# Patient Record
Sex: Female | Born: 1935
Health system: Southern US, Community
[De-identification: ages and names within clinical notes are randomized; demographics above are authoritative.]

## PROBLEM LIST (undated history)

## (undated) DIAGNOSIS — I10 Essential (primary) hypertension: Secondary | ICD-10-CM

## (undated) DIAGNOSIS — I2510 Atherosclerotic heart disease of native coronary artery without angina pectoris: Secondary | ICD-10-CM

## (undated) DIAGNOSIS — E785 Hyperlipidemia, unspecified: Secondary | ICD-10-CM

## (undated) DIAGNOSIS — N183 Chronic kidney disease, stage 3 unspecified: Secondary | ICD-10-CM

## (undated) DIAGNOSIS — C801 Malignant (primary) neoplasm, unspecified: Secondary | ICD-10-CM

## (undated) DIAGNOSIS — T7840XA Allergy, unspecified, initial encounter: Secondary | ICD-10-CM

## (undated) DIAGNOSIS — I447 Left bundle-branch block, unspecified: Secondary | ICD-10-CM

## (undated) DIAGNOSIS — I5022 Chronic systolic (congestive) heart failure: Secondary | ICD-10-CM

## (undated) DIAGNOSIS — I255 Ischemic cardiomyopathy: Secondary | ICD-10-CM

## (undated) DIAGNOSIS — C4490 Unspecified malignant neoplasm of skin, unspecified: Secondary | ICD-10-CM

## (undated) DIAGNOSIS — R519 Headache, unspecified: Secondary | ICD-10-CM

## (undated) DIAGNOSIS — M1990 Unspecified osteoarthritis, unspecified site: Secondary | ICD-10-CM

## (undated) HISTORY — DX: Headache, unspecified: R51.9

## (undated) HISTORY — DX: Unspecified osteoarthritis, unspecified site: M19.90

## (undated) HISTORY — DX: Unspecified malignant neoplasm of skin, unspecified: C44.90

## (undated) HISTORY — DX: Chronic systolic (congestive) heart failure: I50.22

## (undated) HISTORY — DX: Malignant (primary) neoplasm, unspecified: C80.1

## (undated) HISTORY — DX: Hyperlipidemia, unspecified: E78.5

## (undated) HISTORY — DX: Allergy, unspecified, initial encounter: T78.40XA

## (undated) HISTORY — DX: Essential (primary) hypertension: I10

---

## 2010-10-17 DIAGNOSIS — I10 Essential (primary) hypertension: Secondary | ICD-10-CM | POA: Insufficient documentation

## 2010-10-17 DIAGNOSIS — E785 Hyperlipidemia, unspecified: Secondary | ICD-10-CM | POA: Insufficient documentation

## 2011-02-06 DIAGNOSIS — M810 Age-related osteoporosis without current pathological fracture: Secondary | ICD-10-CM | POA: Insufficient documentation

## 2011-02-18 DIAGNOSIS — N289 Disorder of kidney and ureter, unspecified: Secondary | ICD-10-CM | POA: Insufficient documentation

## 2011-10-29 ENCOUNTER — Telehealth: Payer: Self-pay

## 2011-10-29 NOTE — Telephone Encounter (Signed)
PT FAXED A REQUEST FOR CORRECT DOSAGE OF MEDICATION ON THE 15TH  THE DOSAGE IS WRONG  [id number]

## 2011-10-30 ENCOUNTER — Telehealth: Payer: Self-pay | Admitting: Physician Assistant

## 2011-10-30 DIAGNOSIS — I10 Essential (primary) hypertension: Secondary | ICD-10-CM

## 2011-10-30 MED ORDER — HYDROCHLOROTHIAZIDE 25 MG PO TABS: 25.0000 mg | ORAL_TABLET | Freq: Every day | ORAL | Status: DC

## 2011-10-30 NOTE — Telephone Encounter (Signed)
Rx faxed to [facility] and daughter notified.

## 2011-10-30 NOTE — Telephone Encounter (Signed)
See printed Rx for HCTZ 25 mg, #90, RF x 3 to fax with form.  Per OV notes, patient should return for re-eval in March. csj

## 2011-10-30 NOTE — Telephone Encounter (Signed)
Medication corrected and faxed in to [facility], see other msg-- daughter notified.

## 2012-03-10 ENCOUNTER — Other Ambulatory Visit: Payer: Self-pay | Admitting: Family Medicine

## 2012-06-10 ENCOUNTER — Telehealth: Payer: Self-pay

## 2012-06-10 DIAGNOSIS — E785 Hyperlipidemia, unspecified: Secondary | ICD-10-CM

## 2012-06-10 DIAGNOSIS — N289 Disorder of kidney and ureter, unspecified: Secondary | ICD-10-CM

## 2012-06-10 DIAGNOSIS — I10 Essential (primary) hypertension: Secondary | ICD-10-CM

## 2012-06-10 DIAGNOSIS — M810 Age-related osteoporosis without current pathological fracture: Secondary | ICD-10-CM

## 2012-06-10 NOTE — Telephone Encounter (Signed)
Please pull paper chart.  

## 2012-06-10 NOTE — Telephone Encounter (Signed)
Please clarify the pharmacy ([facility] vs. [facility]).  Authorized a 3 month supply, but patient needs OV for additional refills; last seen here 08/24/2011.

## 2012-06-10 NOTE — Telephone Encounter (Signed)
Chart pulled to [place] [facility] [id number]

## 2012-06-10 NOTE — Telephone Encounter (Signed)
[place] MOM IS IN THE NEED OF HER ALENDRONATE AND THE MAIL ORDER [facility] SAYS SINCE THIS IS A NEW PRESCRIPTION IT HAS TO COME FROM THE PHARMACY. PLEASE CALL [phone]   [facility]

## 2012-06-11 MED ORDER — ALENDRONATE SODIUM 35 MG PO TABS: 35.0000 mg | ORAL_TABLET | ORAL | Status: DC

## 2012-06-11 NOTE — Telephone Encounter (Signed)
Verified w/daughter that mail order is now [facility]. Sent in Rx and notified daughter that pt is due for f/up bf more RFs are needed.

## 2012-08-20 ENCOUNTER — Telehealth: Payer: Self-pay

## 2012-08-20 NOTE — Telephone Encounter (Signed)
She needs to have an office visit before any additional refills can be provided. She was told this on 10/2 when last RF was done.

## 2012-08-20 NOTE — Telephone Encounter (Signed)
The patient's daughter called to request that Dr. Linna Darner refill her Fosamax rx.  Please call the patient's daughter Leonides Grills at [phone].

## 2012-08-21 NOTE — Telephone Encounter (Signed)
Thank you, her daughter is advised.

## 2012-10-01 ENCOUNTER — Ambulatory Visit: Payer: 59

## 2012-10-01 ENCOUNTER — Ambulatory Visit (INDEPENDENT_AMBULATORY_CARE_PROVIDER_SITE_OTHER): Payer: 59 | Admitting: Family Medicine

## 2012-10-01 ENCOUNTER — Telehealth: Payer: Self-pay

## 2012-10-01 VITALS — BP 150/82 | HR 88 | Temp 98.2°F | Resp 18 | Ht 60.5 in | Wt 170.0 lb

## 2012-10-01 DIAGNOSIS — M810 Age-related osteoporosis without current pathological fracture: Secondary | ICD-10-CM

## 2012-10-01 DIAGNOSIS — E119 Type 2 diabetes mellitus without complications: Secondary | ICD-10-CM

## 2012-10-01 DIAGNOSIS — R0602 Shortness of breath: Secondary | ICD-10-CM

## 2012-10-01 DIAGNOSIS — Z0000 Encounter for general adult medical examination without abnormal findings: Secondary | ICD-10-CM

## 2012-10-01 DIAGNOSIS — E785 Hyperlipidemia, unspecified: Secondary | ICD-10-CM

## 2012-10-01 DIAGNOSIS — I10 Essential (primary) hypertension: Secondary | ICD-10-CM

## 2012-10-01 LAB — POCT CBC
Granulocyte percent: 56.4 %G (ref 37–80)
MID (cbc): 0.6 (ref 0–0.9)
POC Granulocyte: 4.3 (ref 2–6.9)
POC LYMPH PERCENT: 35.5 %L (ref 10–50)
POC MID %: 8.1 %M (ref 0–12)
Platelet Count, POC: 282 10*3/uL (ref 142–424)
RDW, POC: 14.6 %

## 2012-10-01 LAB — LIPID PANEL
HDL: 66 mg/dL (ref >39)
LDL Cholesterol: 141 mg/dL — ABNORMAL HIGH (ref 0–99)

## 2012-10-01 LAB — COMPREHENSIVE METABOLIC PANEL
AST: 18 U/L (ref 0–37)
Albumin: 4.4 g/dL (ref 3.5–5.2)
Alkaline Phosphatase: 58 U/L (ref 39–117)
BUN: 22 mg/dL (ref 6–23)
Potassium: 4.4 mEq/L (ref 3.5–5.3)
Sodium: 140 mEq/L (ref 135–145)
Total Protein: 7.2 g/dL (ref 6.0–8.3)

## 2012-10-01 LAB — POCT GLYCOSYLATED HEMOGLOBIN (HGB A1C): Hemoglobin A1C: 5.4

## 2012-10-01 MED ORDER — ALENDRONATE SODIUM 35 MG PO TABS: 35.0000 mg | ORAL_TABLET | ORAL | Status: DC

## 2012-10-01 MED ORDER — HYDROCHLOROTHIAZIDE 25 MG PO TABS: 25.0000 mg | ORAL_TABLET | Freq: Every day | ORAL | Status: DC

## 2012-10-01 MED ORDER — AMLODIPINE BESYLATE 5 MG PO TABS: 5.0000 mg | ORAL_TABLET | Freq: Every day | ORAL | Status: DC

## 2012-10-01 NOTE — Telephone Encounter (Signed)
Pt seen rece

## 2012-10-01 NOTE — Patient Instructions (Signed)
Take the amlodipine one each morning along with your hydrochlorothiazide.  Work harder increasing your exercise  Be totally honest with your diet and work hard on trying to eat less.

## 2012-10-01 NOTE — Telephone Encounter (Signed)
Pt seen today, meds sent electronic to [facility] . Pt discovered one of the prescriptions did not go through. Please send again  Alendronate.  If any questions call pt back @ home #

## 2012-10-01 NOTE — Progress Notes (Signed)
Annual physical examination:  History: 77 year old lady who's here for physical examination. She's been doing well without any major problems until recently. She now complains of having shortness of breath apparently when she exerts it's just been worse this winter. She says she is a lot better in the summer and fall but she was working outside a lot. She is very sedentary in the wintertime she worries about her weight, says if she's not able to lose weight despite a strict diet eating salads all the time.  Past medical history: Allergies: Lisinopril caused syncope Current medications: Hydrochlorothiazide Alendronate new Medical illnesses: History skin cancer History of hypertension History of osteoporosis Past surgical history: Unremarkable  Family history: Both parents are deceased. Mother with heart disease and father with lung problems Children are living and well  Social history: Patient is married. She is a retired Network engineer. She used to walk daily, but not getting as much excess aware.  Review of systems: Constitutional: Shortness of breath on exertion otherwise unremarkable. HEENT: Unremarkable Respiratory: As above Cardiovascular: Unremarkable Gastrointestinal: Unremarkable Genitourinary: Unremarkable Osseous skeletal: Unremarkable Dermatologic: Unremarkable Neurologic: Unremarkable Hematologic: Unremarkable Psychiatric: Unremarkable Endocrinologic: Unremarkable   Physical examination: Well-developed well-nourished lady in no major distress this time she probably overweight. TMs normal. Eyes PERRLA. Fundi benign. Throat clear. Neck supple without nodes thyromegaly. No carotid bruits. Chest clear to auscultation. Heart regular without murmurs gallops or arrhythmias. And soft mass or tenderness. Extremities unremarkable. Dermatologic unremarkable.  Assessment: Physical examination Hypertension, unsatisfactory control Dyspnea on exertion History skin  cancer  Plan: Chest x-ray, EKG, and labs.   Results for orders placed in visit on 10/01/12  POCT CBC      Component Value Range   WBC 7.7  4.6 - 10.2 K/uL   Lymph, poc 2.7  0.6 - 3.4   POC LYMPH PERCENT 35.5  10 - 50 %L   MID (cbc) 0.6  0 - 0.9   POC MID % 8.1  0 - 12 %M   POC Granulocyte 4.3  2 - 6.9   Granulocyte percent 56.4  37 - 80 %G   RBC 5.44  4.04 - 5.48 M/uL   Hemoglobin 15.2  12.2 - 16.2 g/dL   HCT, POC 47.7  37.7 - 47.9 %   MCV 87.7  80 - 97 fL   MCH, POC 27.9  27 - 31.2 pg   MCHC 31.9  31.8 - 35.4 g/dL   RDW, POC 14.6     Platelet Count, POC 282  142 - 424 K/uL   MPV 10.5  0 - 99.8 fL  POCT GLYCOSYLATED HEMOGLOBIN (HGB A1C)      Component Value Range   Hemoglobin A1C 5.4     [facility] reading (PRIMARY) by  Dr. Linna Darner Normal   EKG shows left bundle branch block pattern  Assessment: I do not know why she is short of breath on exertion. Her get more regular exercise is as a suspect deconditioning his private  Urged her be more consistent or eating. She says she's not eating a think, but did have sherbert desert yesterday..  Monitor her own blood pressures. Return one year, sooner if blood pressure runs high. Shortness of breath does not get better she is to let me know.

## 2012-10-01 NOTE — Telephone Encounter (Signed)
It looked like it went through on our end, but I resent it.

## 2012-10-02 NOTE — Telephone Encounter (Signed)
LMOM RX resent.

## 2012-10-05 ENCOUNTER — Telehealth: Payer: Self-pay

## 2012-10-05 NOTE — Telephone Encounter (Signed)
The following email was submitted to your website from Shaqu[facility] Kujala My phone # is [phone].  My e-mail is [email or web address]  I may not have given you my e-mail address. My daughter recd. a call about a prescription instead of me.  Could you please send me a copy of any lab reports to either my e-mail or [address], [place], [place] [address].  Thank you very much.  Date of birth: 1936-05-17

## 2012-10-05 NOTE — Telephone Encounter (Signed)
Mailed labs. Called pt to advise cholesterol elevated. Pt will work on diet and exercise.

## 2012-10-24 ENCOUNTER — Other Ambulatory Visit: Payer: Self-pay

## 2012-12-08 HISTORY — PX: OTHER SURGICAL HISTORY: SHX169

## 2013-04-14 ENCOUNTER — Other Ambulatory Visit: Payer: Self-pay

## 2013-07-15 ENCOUNTER — Other Ambulatory Visit: Payer: Self-pay

## 2013-08-13 ENCOUNTER — Other Ambulatory Visit: Payer: Self-pay | Admitting: Physician Assistant

## 2013-08-13 ENCOUNTER — Other Ambulatory Visit: Payer: Self-pay | Admitting: Family Medicine

## 2013-11-11 ENCOUNTER — Ambulatory Visit (INDEPENDENT_AMBULATORY_CARE_PROVIDER_SITE_OTHER): Payer: 59 | Admitting: Family Medicine

## 2013-11-11 ENCOUNTER — Encounter: Payer: Self-pay | Admitting: Family Medicine

## 2013-11-11 VITALS — BP 146/60 | HR 85 | Temp 98.6°F | Resp 16 | Ht 61.0 in | Wt 173.0 lb

## 2013-11-11 DIAGNOSIS — M810 Age-related osteoporosis without current pathological fracture: Secondary | ICD-10-CM

## 2013-11-11 DIAGNOSIS — I10 Essential (primary) hypertension: Secondary | ICD-10-CM

## 2013-11-11 MED ORDER — HYDROCHLOROTHIAZIDE 25 MG PO TABS: 25.0000 mg | ORAL_TABLET | Freq: Every day | ORAL | Status: DC

## 2013-11-11 MED ORDER — ALENDRONATE SODIUM 35 MG PO TABS: 35.0000 mg | ORAL_TABLET | ORAL | Status: DC

## 2013-11-11 MED ORDER — AMLODIPINE BESYLATE 5 MG PO TABS: 5.0000 mg | ORAL_TABLET | Freq: Every day | ORAL | Status: DC

## 2013-11-11 NOTE — Progress Notes (Signed)
Subjective: Patient is here for her regular visit. She has no major problems or concerns. She wants a blood pressure medicine refilled and her bone density medicine refilled. She has not had any major physical concerns. She does not like coming in here more than once a year. she wants to make sure she gets one year worth of medicine. No more shortness of breath. No chest pains or palpitations. No GI or GU complaints. Walks her dog in the development or in the woods. Has had no falls. She stays busy with indirect 30s in the wintertime, needle work etc. and in the summer does a lot of gardening.  Objective: Pleasant lady in no acute distress. Throat clear. TMs normal. Neck supple without nodes thyromegaly. No carotid bruits. Chest clear. Heart regular without murmurs. No ankle edema.  Assessment: Hypertension Osteoporosis  Plan: Same medications. Return in one year

## 2013-11-11 NOTE — Patient Instructions (Signed)
Continue exercising  Same meds  Return in 1 year

## 2014-10-02 ENCOUNTER — Other Ambulatory Visit: Payer: Self-pay | Admitting: Family Medicine

## 2014-11-29 ENCOUNTER — Encounter: Payer: Self-pay | Admitting: *Deleted

## 2014-12-21 ENCOUNTER — Ambulatory Visit (INDEPENDENT_AMBULATORY_CARE_PROVIDER_SITE_OTHER): Payer: Medicare Other | Admitting: Family Medicine

## 2014-12-21 VITALS — BP 150/70 | HR 77 | Temp 97.7°F | Resp 18 | Ht 61.0 in | Wt 174.2 lb

## 2014-12-21 DIAGNOSIS — I10 Essential (primary) hypertension: Secondary | ICD-10-CM

## 2014-12-21 DIAGNOSIS — M810 Age-related osteoporosis without current pathological fracture: Secondary | ICD-10-CM | POA: Diagnosis not present

## 2014-12-21 DIAGNOSIS — N289 Disorder of kidney and ureter, unspecified: Secondary | ICD-10-CM | POA: Diagnosis not present

## 2014-12-21 DIAGNOSIS — E785 Hyperlipidemia, unspecified: Secondary | ICD-10-CM | POA: Diagnosis not present

## 2014-12-21 LAB — LIPID PANEL
CHOLESTEROL: 222 mg/dL — AB (ref 0–200)
HDL: 68 mg/dL (ref >=46)
LDL Cholesterol: 138 mg/dL — ABNORMAL HIGH (ref 0–99)
TRIGLYCERIDES: 82 mg/dL (ref <150)
Total CHOL/HDL Ratio: 3.3 Ratio
VLDL: 16 mg/dL (ref 0–40)

## 2014-12-21 LAB — POCT CBC
Granulocyte percent: 59.9 %G (ref 37–80)
HCT, POC: 42.6 % (ref 37.7–47.9)
HEMOGLOBIN: 13.7 g/dL (ref 12.2–16.2)
Lymph, poc: 2.4 (ref 0.6–3.4)
MCH, POC: 26.5 pg — AB (ref 27–31.2)
MCHC: 32.3 g/dL (ref 31.8–35.4)
MCV: 82 fL (ref 80–97)
MID (CBC): 0.7 (ref 0–0.9)
MPV: 8.9 fL (ref 0–99.8)
POC GRANULOCYTE: 4.6 (ref 2–6.9)
POC LYMPH PERCENT: 31 %L (ref 10–50)
POC MID %: 9.1 % (ref 0–12)
Platelet Count, POC: 274 10*3/uL (ref 142–424)
RBC: 5.19 M/uL (ref 4.04–5.48)
RDW, POC: 15 %
WBC: 7.7 10*3/uL (ref 4.6–10.2)

## 2014-12-21 LAB — COMPREHENSIVE METABOLIC PANEL
ALBUMIN: 4.3 g/dL (ref 3.5–5.2)
ALT: 11 U/L (ref 0–35)
AST: 18 U/L (ref 0–37)
Alkaline Phosphatase: 64 U/L (ref 39–117)
BUN: 19 mg/dL (ref 6–23)
CO2: 25 mEq/L (ref 19–32)
Calcium: 9.5 mg/dL (ref 8.4–10.5)
Chloride: 101 mEq/L (ref 96–112)
Creat: 1.19 mg/dL — ABNORMAL HIGH (ref 0.50–1.10)
Glucose, Bld: 92 mg/dL (ref 70–99)
POTASSIUM: 5 meq/L (ref 3.5–5.3)
Sodium: 138 mEq/L (ref 135–145)
TOTAL PROTEIN: 7.4 g/dL (ref 6.0–8.3)
Total Bilirubin: 0.6 mg/dL (ref 0.2–1.2)

## 2014-12-21 MED ORDER — ALENDRONATE SODIUM 35 MG PO TABS: ORAL_TABLET | ORAL | Status: DC

## 2014-12-21 MED ORDER — AMLODIPINE BESYLATE 5 MG PO TABS: ORAL_TABLET | ORAL | Status: DC

## 2014-12-21 MED ORDER — HYDROCHLOROTHIAZIDE 25 MG PO TABS: 25.0000 mg | ORAL_TABLET | Freq: Every day | ORAL | Status: DC

## 2014-12-21 NOTE — Progress Notes (Signed)
Subjective: 79 year old lady who's here for follow-up with regard to her blood pressure and osteopenia. She is generally well. She worries that she cannot seem to lose her weight. She does not check her blood pressure elsewhere. She does get regular exercise. They have about 20 acres and they are always outdoors working there land. She does not smoke. She takes her medicines faithfully, but ran out of the amlodipine yesterday. She denies headaches or dizziness. No chest pains or shortness of breath or dyspnea on exertion. No GI or GU complaints. No bleeding. Denies any major arthritic problems. She use to swallow little bit but after she cut back her salt that has not been an issue. She has no major problems with memory or depression. She is married. He enjoys her pets.  Objective: No major distress. Throat clear. Neck supple without nodes or thyromegaly. No carotid bruits. Chest clear. Heart regular without murmurs. Abdomen soft without masses or tenderness. Extremities without edema.  Assessment: Hypertension History of osteopenia (I cannot locate the report on her prior bone density) Overweight  Plan: Keep trying to be careful in the eating. Continue regular exercise. Continue same medications for now. Probably should get a bone density scan done to make sure if she Needs to continue on the alendronate. Refill her medications. Check baseline labs.  Advise it for Medicare physical she can schedule in the facility next door. That may provide some additional benefit coverage.

## 2014-12-21 NOTE — Patient Instructions (Addendum)
Continue current medications  I am ordering a repeat bone density scan. We will then decide whether you need to stay on long-term amlodipine or not. There is a lot of evidence now saying that it should just be continued for 3-5 years.  Continue to get regular exercise and watch her dietary intake  Advise recheck about every 6 months  Advise that for a Medicare physical she can schedule with a provider in the facility next door at [address]

## 2014-12-24 ENCOUNTER — Telehealth: Payer: Self-pay

## 2014-12-24 NOTE — Telephone Encounter (Signed)
-----   Message from Posey Boyer, MD sent at 12/22/2014  8:36 AM EDT ----- Call: Cholesterol is high.  I would recommend her trying to avoid fried and fatty foods and get more regular exercise

## 2014-12-25 NOTE — Telephone Encounter (Signed)
Pt notified and copy sent

## 2015-01-11 ENCOUNTER — Telehealth: Payer: Self-pay

## 2015-01-11 NOTE — Telephone Encounter (Signed)
Pt called into voicemail on Wednesday 01/11/15 at 11:57am requesting a copy of her bone density scan that Dr. Linna Darner had order her a few weeks ago, be mailed to her home address. She will need to fill out an ROI to get these results, can someone please call her and let her know thanks.

## 2015-01-18 NOTE — Telephone Encounter (Signed)
I checked Dr. Clayborn Heron box and the scan box and did not see a bone density scan. It is not scanned into Epic yet. LMOM for patient stating to request copy from the office she had it done at because I'm not sure where she went for her scan.

## 2015-02-03 ENCOUNTER — Encounter: Payer: Self-pay | Admitting: Family Medicine

## 2015-10-26 ENCOUNTER — Other Ambulatory Visit: Payer: Self-pay | Admitting: Family Medicine

## 2015-10-30 ENCOUNTER — Other Ambulatory Visit: Payer: Self-pay | Admitting: Family Medicine

## 2015-12-14 ENCOUNTER — Other Ambulatory Visit: Payer: Self-pay | Admitting: Family Medicine

## 2016-01-26 ENCOUNTER — Other Ambulatory Visit: Payer: Self-pay | Admitting: Family Medicine

## 2016-01-26 NOTE — Telephone Encounter (Signed)
What is patient follow up plan for refills.  Needs appt.

## 2016-01-29 NOTE — Telephone Encounter (Signed)
Call: It has been over a year since she was here.  Needs to be seen.  I am no longer a regular physician there and will not be following her regularly.  She can come in to have someone reassess, and she can decide what she wants in the future here or elsewhere.

## 2016-01-31 ENCOUNTER — Telehealth: Payer: Self-pay | Admitting: Emergency Medicine

## 2016-01-31 ENCOUNTER — Telehealth: Payer: Self-pay

## 2016-01-31 NOTE — Telephone Encounter (Signed)
Patient returned phone call. Patient stated she is not sure when she will be in for an office visit. Patient stated she think she have enough medication to last. She will call back before she run out.

## 2016-01-31 NOTE — Telephone Encounter (Signed)
Left message, pt will need a f/u visit before medication refill Amlodipine and HCTZ per Dr. Linna Darner Pt will also need to establish care with new provider.

## 2016-02-01 NOTE — Telephone Encounter (Signed)
Patient needs seen by someone at [facility]  To get these refills

## 2016-02-17 NOTE — Telephone Encounter (Signed)
Spoke to patient and will come in to office to be seen soon.

## 2016-03-01 ENCOUNTER — Ambulatory Visit (INDEPENDENT_AMBULATORY_CARE_PROVIDER_SITE_OTHER): Payer: Medicare Other | Admitting: Physician Assistant

## 2016-03-01 VITALS — BP 138/74 | HR 83 | Temp 98.0°F | Ht 60.75 in | Wt 182.0 lb

## 2016-03-01 DIAGNOSIS — I10 Essential (primary) hypertension: Secondary | ICD-10-CM

## 2016-03-01 DIAGNOSIS — Z23 Encounter for immunization: Secondary | ICD-10-CM | POA: Diagnosis not present

## 2016-03-01 DIAGNOSIS — E785 Hyperlipidemia, unspecified: Secondary | ICD-10-CM | POA: Diagnosis not present

## 2016-03-01 LAB — COMPLETE METABOLIC PANEL WITH GFR
ALBUMIN: 4 g/dL (ref 3.6–5.1)
ALK PHOS: 67 U/L (ref 33–130)
ALT: 9 U/L (ref 6–29)
AST: 17 U/L (ref 10–35)
BILIRUBIN TOTAL: 0.5 mg/dL (ref 0.2–1.2)
BUN: 21 mg/dL (ref 7–25)
CALCIUM: 9.2 mg/dL (ref 8.6–10.4)
CHLORIDE: 103 mmol/L (ref 98–110)
CO2: 26 mmol/L (ref 20–31)
CREATININE: 1.29 mg/dL — AB (ref 0.60–0.93)
GFR, EST AFRICAN AMERICAN: 46 mL/min — AB (ref >=60)
GFR, Est Non African American: 40 mL/min — ABNORMAL LOW (ref >=60)
Glucose, Bld: 101 mg/dL — ABNORMAL HIGH (ref 65–99)
Potassium: 4.2 mmol/L (ref 3.5–5.3)
Sodium: 138 mmol/L (ref 135–146)
Total Protein: 6.9 g/dL (ref 6.1–8.1)

## 2016-03-01 LAB — CBC
HEMATOCRIT: 40.6 % (ref 35.0–45.0)
HEMOGLOBIN: 13.5 g/dL (ref 11.7–15.5)
MCH: 27.7 pg (ref 27.0–33.0)
MCHC: 33.3 g/dL (ref 32.0–36.0)
MCV: 83.2 fL (ref 80.0–100.0)
MPV: 10.3 fL (ref 7.5–12.5)
Platelets: 321 10*3/uL (ref 140–400)
RBC: 4.88 MIL/uL (ref 3.80–5.10)
RDW: 14.8 % (ref 11.0–15.0)
WBC: 8.2 10*3/uL (ref 3.8–10.8)

## 2016-03-01 LAB — LIPID PANEL
CHOLESTEROL: 226 mg/dL — AB (ref 125–200)
HDL: 72 mg/dL (ref >=46)
LDL Cholesterol: 138 mg/dL — ABNORMAL HIGH (ref <130)
TRIGLYCERIDES: 81 mg/dL (ref <150)
Total CHOL/HDL Ratio: 3.1 Ratio (ref <=5.0)
VLDL: 16 mg/dL (ref <30)

## 2016-03-01 MED ORDER — HYDROCHLOROTHIAZIDE 25 MG PO TABS: 25.0000 mg | ORAL_TABLET | Freq: Two times a day (BID) | ORAL | Status: DC

## 2016-03-01 MED ORDER — ALENDRONATE SODIUM 35 MG PO TABS: ORAL_TABLET | ORAL | Status: DC

## 2016-03-01 MED ORDER — AMLODIPINE BESYLATE 5 MG PO TABS: 5.0000 mg | ORAL_TABLET | Freq: Every day | ORAL | Status: DC

## 2016-03-01 NOTE — Patient Instructions (Addendum)
     IF you received an x-ray today, you will receive an invoice from [facility]. Please contact [facility] at [phone] with questions or concerns regarding your invoice.   IF you received labwork today, you will receive an invoice from [employer]. Please contact [facility] at [phone] with questions or concerns regarding your invoice.   Our billing staff will not be able to assist you with questions regarding bills from these companies.  You will be contacted with the lab results as soon as they are available. The fastest way to get your results is to activate your My Chart account. Instructions are located on the last page of this paperwork. If you have not heard from [place] regarding the results in 2 weeks, please contact this office.    Please try to include aerobic (heart pumping) exercise four times per week for 30 minutes.  This can be swimming --low impact activity.  Or a brisk walk.   I will have your lab results within the next 7-10 days. Medications have been refilled, and sent to pharmacy.

## 2016-03-01 NOTE — Progress Notes (Signed)
[facility] [address] [place] [address] [phone]  Date:  03/01/2016   Name:  Katherine Brooks   DOB:  06-Apr-1936   MRN:  [id number]  PCP:  Ruben Reason, MD    History of Present Illness:  Katherine Brooks is a 80 y.o. female patient who presents to [facility] for refill of medications. Patient reports no chest pains, palpitations, leg swelling shortness of breath, or vision changes. She has been quite compliant on her blood pressure medication. She reports no side effects of taking the medication. No recent falls.  Chief Complaint  Patient presents with  . Medication Refill    Fosamax, Norvasc, HCTZ- 90 da supply w/3 refills to mail order rx  . elevated BP at triage    138/74     Patient Active Problem List   Diagnosis Date Noted  . Renal insufficiency, mild 02/18/2011  . Osteoporosis 02/06/2011  . HTN (hypertension) 10/17/2010  . Hyperlipidemia 10/17/2010    Past Medical History  Diagnosis Date  . Allergy   . Cancer ([place])   . Hypertension     Past Surgical History  Procedure Laterality Date  . Salivary gland N/A 12/2012    Social History  Substance Use Topics  . Smoking status: Never Smoker   . Smokeless tobacco: None  . Alcohol Use: No    Family History  Problem Relation Age of Onset  . Heart disease Mother     Allergies  Allergen Reactions  . Lisinopril     Medication list has been reviewed and updated.  Current Outpatient Prescriptions on File Prior to Visit  Medication Sig Dispense Refill  . alendronate (FOSAMAX) 35 MG tablet TAKE 1 TABLET BY MOUTH  EVERY 7 DAYS. TAKE WITH A  FULL GALSS OF WATER ON AN  EMPTY STOMACH 4 tablet 0  . amLODipine (NORVASC) 5 MG tablet Take 1 tablet by mouth  daily 90 tablet 0  . hydrochlorothiazide (HYDRODIURIL) 25 MG tablet Take 1 tablet by mouth  daily 90 tablet 0   No current facility-administered medications on file prior to visit.    ROS ROS otherwise unremarkable unless isted above.     Physical Examination: BP 138/74 mmHg  Pulse 83  Temp(Src) 98 F (36.7 C) (Oral)  Ht 5' 0.75" (1.543 m)  Wt 182 lb (82.555 kg)  BMI 34.67 kg/m2  SpO2 98% Ideal Body Weight: Weight in (lb) to have BMI = 25: 131  Physical Exam  Constitutional: She is oriented to person, place, and time. She appears well-developed and well-nourished. No distress.  HENT:  Head: Normocephalic and atraumatic.  Right Ear: External ear normal.  Left Ear: External ear normal.  Eyes: Conjunctivae and EOM are normal. Pupils are equal, round, and reactive to light.  Cardiovascular: Normal rate, regular rhythm and normal heart sounds.  Exam reveals no gallop and no friction rub.   No murmur heard. Pulses:      Carotid pulses are 2+ on the right side, and 2+ on the left side. Pulmonary/Chest: Effort normal. No respiratory distress.  Neurological: She is alert and oriented to person, place, and time.  Skin: She is not diaphoretic.  Psychiatric: She has a normal mood and affect. Her behavior is normal.     Assessment and Plan: Katherine Brooks is a 80 y.o. female who is here today for recheck of blood pressure, and medication refill This was refilled Advised to return in 6 months. I have encouraged that she rtc for  physical exam. Prevnar given.  rtc in 1 year for pneumovax Essential hypertension - Plan: amLODipine (NORVASC) 5 MG tablet, hydrochlorothiazide (HYDRODIURIL) 25 MG tablet, CBC, COMPLETE METABOLIC PANEL WITH GFR, Lipid panel  Hyperlipidemia - Plan: Lipid panel  Need for vaccination with 13-polyvalent pneumococcal conjugate vaccine - Plan: Pneumococcal conjugate vaccine 13-valent IM    Ivar Drape, PA-C [facility] 03/01/2016 9:08 AM

## 2016-07-20 ENCOUNTER — Other Ambulatory Visit: Payer: Self-pay | Admitting: Physician Assistant

## 2016-07-20 DIAGNOSIS — I10 Essential (primary) hypertension: Secondary | ICD-10-CM

## 2016-07-24 ENCOUNTER — Other Ambulatory Visit: Payer: Self-pay

## 2016-07-24 DIAGNOSIS — I10 Essential (primary) hypertension: Secondary | ICD-10-CM

## 2016-07-24 MED ORDER — HYDROCHLOROTHIAZIDE 25 MG PO TABS: 25.0000 mg | ORAL_TABLET | Freq: Two times a day (BID) | ORAL | 2 refills | Status: DC

## 2016-12-16 ENCOUNTER — Other Ambulatory Visit: Payer: Self-pay | Admitting: Physician Assistant

## 2016-12-16 DIAGNOSIS — I10 Essential (primary) hypertension: Secondary | ICD-10-CM

## 2017-03-04 ENCOUNTER — Other Ambulatory Visit: Payer: Self-pay | Admitting: Physician Assistant

## 2017-03-04 DIAGNOSIS — I10 Essential (primary) hypertension: Secondary | ICD-10-CM

## 2017-03-05 ENCOUNTER — Other Ambulatory Visit: Payer: Self-pay | Admitting: Physician Assistant

## 2017-03-10 NOTE — Telephone Encounter (Signed)
mychart message sent to pt about making an appt °

## 2017-12-17 ENCOUNTER — Encounter: Payer: Self-pay | Admitting: Physician Assistant

## 2018-03-15 ENCOUNTER — Emergency Department (HOSPITAL_BASED_OUTPATIENT_CLINIC_OR_DEPARTMENT_OTHER): Payer: Medicare Other

## 2018-03-15 ENCOUNTER — Encounter (HOSPITAL_BASED_OUTPATIENT_CLINIC_OR_DEPARTMENT_OTHER): Payer: Self-pay | Admitting: Emergency Medicine

## 2018-03-15 ENCOUNTER — Other Ambulatory Visit: Payer: Self-pay

## 2018-03-15 ENCOUNTER — Observation Stay (HOSPITAL_BASED_OUTPATIENT_CLINIC_OR_DEPARTMENT_OTHER)
Admission: EM | Admit: 2018-03-15 | Discharge: 2018-03-17 | Disposition: A | Discharge status: Home / Self Care | Payer: Medicare Other | Attending: Internal Medicine | Admitting: Internal Medicine

## 2018-03-15 DIAGNOSIS — D649 Anemia, unspecified: Secondary | ICD-10-CM | POA: Diagnosis not present

## 2018-03-15 DIAGNOSIS — Z9889 Other specified postprocedural states: Secondary | ICD-10-CM | POA: Insufficient documentation

## 2018-03-15 DIAGNOSIS — A419 Sepsis, unspecified organism: Secondary | ICD-10-CM | POA: Diagnosis not present

## 2018-03-15 DIAGNOSIS — Z85828 Personal history of other malignant neoplasm of skin: Secondary | ICD-10-CM | POA: Diagnosis not present

## 2018-03-15 DIAGNOSIS — X58XXXA Exposure to other specified factors, initial encounter: Secondary | ICD-10-CM | POA: Insufficient documentation

## 2018-03-15 DIAGNOSIS — T148XXA Other injury of unspecified body region, initial encounter: Secondary | ICD-10-CM | POA: Insufficient documentation

## 2018-03-15 DIAGNOSIS — I10 Essential (primary) hypertension: Secondary | ICD-10-CM | POA: Insufficient documentation

## 2018-03-15 DIAGNOSIS — L089 Local infection of the skin and subcutaneous tissue, unspecified: Secondary | ICD-10-CM | POA: Diagnosis not present

## 2018-03-15 DIAGNOSIS — R197 Diarrhea, unspecified: Secondary | ICD-10-CM | POA: Insufficient documentation

## 2018-03-15 DIAGNOSIS — Z888 Allergy status to other drugs, medicaments and biological substances status: Secondary | ICD-10-CM | POA: Insufficient documentation

## 2018-03-15 DIAGNOSIS — E876 Hypokalemia: Secondary | ICD-10-CM | POA: Diagnosis not present

## 2018-03-15 DIAGNOSIS — Z8249 Family history of ischemic heart disease and other diseases of the circulatory system: Secondary | ICD-10-CM | POA: Diagnosis not present

## 2018-03-15 DIAGNOSIS — N390 Urinary tract infection, site not specified: Secondary | ICD-10-CM | POA: Insufficient documentation

## 2018-03-15 DIAGNOSIS — R918 Other nonspecific abnormal finding of lung field: Secondary | ICD-10-CM | POA: Diagnosis not present

## 2018-03-15 DIAGNOSIS — Z79899 Other long term (current) drug therapy: Secondary | ICD-10-CM | POA: Diagnosis not present

## 2018-03-15 DIAGNOSIS — N179 Acute kidney failure, unspecified: Principal | ICD-10-CM | POA: Insufficient documentation

## 2018-03-15 DIAGNOSIS — R509 Fever, unspecified: Secondary | ICD-10-CM | POA: Diagnosis not present

## 2018-03-15 DIAGNOSIS — W57XXXA Bitten or stung by nonvenomous insect and other nonvenomous arthropods, initial encounter: Secondary | ICD-10-CM

## 2018-03-15 LAB — CBC WITH DIFFERENTIAL/PLATELET
BASOS PCT: 0 %
Basophils Absolute: 0 10*3/uL (ref 0.0–0.1)
EOS PCT: 1 %
Eosinophils Absolute: 0.1 10*3/uL (ref 0.0–0.7)
HCT: 38.6 % (ref 36.0–46.0)
HEMOGLOBIN: 13 g/dL (ref 12.0–15.0)
LYMPHS ABS: 0.3 10*3/uL — AB (ref 0.7–4.0)
Lymphocytes Relative: 2 %
MCH: 27.6 pg (ref 26.0–34.0)
MCHC: 33.7 g/dL (ref 30.0–36.0)
MCV: 82 fL (ref 78.0–100.0)
Monocytes Absolute: 0.4 10*3/uL (ref 0.1–1.0)
Monocytes Relative: 3 %
NEUTROS PCT: 94 %
Neutro Abs: 13.2 10*3/uL — ABNORMAL HIGH (ref 1.7–7.7)
Platelets: 196 10*3/uL (ref 150–400)
RBC: 4.71 MIL/uL (ref 3.87–5.11)
RDW: 15.7 % — AB (ref 11.5–15.5)
WBC: 14.1 10*3/uL — AB (ref 4.0–10.5)

## 2018-03-15 LAB — URINALYSIS, MICROSCOPIC (REFLEX): RBC / HPF: NONE SEEN RBC/hpf (ref 0–5)

## 2018-03-15 LAB — URINALYSIS, ROUTINE W REFLEX MICROSCOPIC
BILIRUBIN URINE: NEGATIVE
GLUCOSE, UA: NEGATIVE mg/dL
HGB URINE DIPSTICK: NEGATIVE
Ketones, ur: NEGATIVE mg/dL
Nitrite: NEGATIVE
PH: 5.5 (ref 5.0–8.0)
Protein, ur: NEGATIVE mg/dL
SPECIFIC GRAVITY, URINE: 1.02 (ref 1.005–1.030)

## 2018-03-15 LAB — COMPREHENSIVE METABOLIC PANEL
ALT: 12 U/L (ref 0–44)
AST: 30 U/L (ref 15–41)
Albumin: 3.5 g/dL (ref 3.5–5.0)
Alkaline Phosphatase: 61 U/L (ref 38–126)
Anion gap: 9 (ref 5–15)
BILIRUBIN TOTAL: 0.7 mg/dL (ref 0.3–1.2)
BUN: 24 mg/dL — AB (ref 8–23)
CHLORIDE: 107 mmol/L (ref 98–111)
CO2: 23 mmol/L (ref 22–32)
CREATININE: 1.68 mg/dL — AB (ref 0.44–1.00)
Calcium: 8.9 mg/dL (ref 8.9–10.3)
GFR, EST AFRICAN AMERICAN: 32 mL/min — AB (ref >60)
GFR, EST NON AFRICAN AMERICAN: 28 mL/min — AB (ref >60)
Glucose, Bld: 113 mg/dL — ABNORMAL HIGH (ref 70–99)
Potassium: 3 mmol/L — ABNORMAL LOW (ref 3.5–5.1)
Sodium: 139 mmol/L (ref 135–145)
Total Protein: 6.8 g/dL (ref 6.5–8.1)

## 2018-03-15 LAB — I-STAT CG4 LACTIC ACID, ED
Lactic Acid, Venous: 1.49 mmol/L (ref 0.5–1.9)
Lactic Acid, Venous: 1.05 mmol/L (ref 0.5–1.9)

## 2018-03-15 LAB — MAGNESIUM: Magnesium: 2.1 mg/dL (ref 1.7–2.4)

## 2018-03-15 LAB — LIPASE, BLOOD: Lipase: 31 U/L (ref 11–51)

## 2018-03-15 MED ORDER — ACETAMINOPHEN 325 MG PO TABS
325.0000 mg | ORAL_TABLET | Freq: Once | ORAL | Status: AC
  Administered 2018-03-15: 325 mg via ORAL
  Filled 2018-03-15: qty 1

## 2018-03-15 MED ORDER — SODIUM CHLORIDE 0.9 % IV SOLN
1000.0000 mL | INTRAVENOUS | Status: DC
  Administered 2018-03-15: 1000 mL via INTRAVENOUS

## 2018-03-15 MED ORDER — ACETAMINOPHEN 325 MG PO TABS
650.0000 mg | ORAL_TABLET | Freq: Four times a day (QID) | ORAL | Status: DC | PRN
  Administered 2018-03-17: 650 mg via ORAL
  Filled 2018-03-15: qty 2

## 2018-03-15 MED ORDER — ACETAMINOPHEN 325 MG PO TABS
650.0000 mg | ORAL_TABLET | Freq: Once | ORAL | Status: AC
  Administered 2018-03-15: 650 mg via ORAL
  Filled 2018-03-15: qty 2

## 2018-03-15 MED ORDER — AMLODIPINE BESYLATE 5 MG PO TABS
2.5000 mg | ORAL_TABLET | Freq: Every day | ORAL | Status: DC
  Administered 2018-03-15 – 2018-03-17 (×3): 2.5 mg via ORAL
  Filled 2018-03-15 (×3): qty 1

## 2018-03-15 MED ORDER — SODIUM CHLORIDE 0.9 % IV BOLUS
500.0000 mL | Freq: Once | INTRAVENOUS | Status: AC
  Administered 2018-03-15: 500 mL via INTRAVENOUS

## 2018-03-15 MED ORDER — HYDRALAZINE HCL 20 MG/ML IJ SOLN: 10.0000 mg | Freq: Four times a day (QID) | INTRAMUSCULAR | Status: DC | PRN

## 2018-03-15 MED ORDER — POTASSIUM CHLORIDE CRYS ER 20 MEQ PO TBCR
40.0000 meq | EXTENDED_RELEASE_TABLET | Freq: Once | ORAL | Status: AC
  Administered 2018-03-15: 40 meq via ORAL
  Filled 2018-03-15: qty 2

## 2018-03-15 MED ORDER — SODIUM CHLORIDE 0.9 % IV SOLN
1.0000 g | Freq: Once | INTRAVENOUS | Status: AC
  Administered 2018-03-15: 1 g via INTRAVENOUS
  Filled 2018-03-15: qty 10

## 2018-03-15 MED ORDER — ONDANSETRON HCL 4 MG/2ML IJ SOLN
4.0000 mg | Freq: Once | INTRAMUSCULAR | Status: AC
  Administered 2018-03-15: 4 mg via INTRAVENOUS
  Filled 2018-03-15: qty 2

## 2018-03-15 MED ORDER — SODIUM CHLORIDE 0.9 % IV BOLUS
1000.0000 mL | Freq: Once | INTRAVENOUS | Status: AC
  Administered 2018-03-15: 1000 mL via INTRAVENOUS

## 2018-03-15 MED ORDER — ENOXAPARIN SODIUM 30 MG/0.3ML ~~LOC~~ SOLN
30.0000 mg | SUBCUTANEOUS | Status: DC
  Administered 2018-03-15 – 2018-03-16 (×2): 30 mg via SUBCUTANEOUS
  Filled 2018-03-15 (×2): qty 0.3

## 2018-03-15 MED ORDER — SODIUM CHLORIDE 0.9 % IV SOLN
1.0000 g | INTRAVENOUS | Status: DC
  Administered 2018-03-16: 1 g via INTRAVENOUS
  Filled 2018-03-15: qty 1

## 2018-03-15 MED ORDER — ACETAMINOPHEN 650 MG RE SUPP: 650.0000 mg | Freq: Four times a day (QID) | RECTAL | Status: DC | PRN

## 2018-03-15 MED ORDER — POTASSIUM CHLORIDE IN NACL 20-0.9 MEQ/L-% IV SOLN
INTRAVENOUS | Status: AC
  Administered 2018-03-15: 21:00:00 via INTRAVENOUS
  Filled 2018-03-15 (×2): qty 1000

## 2018-03-15 MED ORDER — DOXYCYCLINE HYCLATE 100 MG PO TABS
100.0000 mg | ORAL_TABLET | Freq: Two times a day (BID) | ORAL | Status: DC
  Administered 2018-03-15 – 2018-03-17 (×5): 100 mg via ORAL
  Filled 2018-03-15 (×5): qty 1

## 2018-03-15 NOTE — ED Triage Notes (Signed)
Patient states that she started to have a fever, with chills and shakes starting last night - the patient reports that she has had - N/V/D as well - The patient reports multiple tick bites in the last month - 6 weeks

## 2018-03-15 NOTE — ED Notes (Signed)
ED Provider at bedside. 

## 2018-03-15 NOTE — H&P (Signed)
TRH H&P   Patient Demographics:    Katherine Brooks, is a 82 y.o. female  MRN: [id number]   DOB - [birth year]  Admit Date - 03/15/2018  Outpatient Primary MD for the patient is Patient, No Pcp Per  Referring MD/NP/PA: Alferd Apa  Outpatient Specialists:      Patient coming from:   home  Chief Complaint  Patient presents with  . Fever      HPI:    Katherine Brooks  is a 82 y.o. female, w  Hypertension apparently c/o chills, myalgia, n/v, diarrhea.  Pt apparently noted tick bites over the past few weeks.    In Ed,  CXR IMPRESSION: No acute consolidative airspace disease to suggest a pneumonia.  Nonspecific linear interstitial prominence in the right greater than left lungs, which could be due to nonspecific scarring. If there is clinical concern for interstitial lung disease, high-resolution chest CT could be obtained on a short term outpatient basis for further evaluation.  Urinalysis  Wbc 11-20 Na 139, K 3.0,  Bun 24, Creatinine 1.68 Ast 30, Alt 12  Wbc 14.1, hgb 13.0, Plt 196  Pt will be admitted for w/up of UTI, sepsis (leukocytosis, fever, AKI) and AKI    Review of systems:    In addition to the HPI above,   No Headache, No changes with Vision or hearing, No problems swallowing food or Liquids, No Chest pain, Cough or Shortness of Breath, No Abdominal pain, No Nausea or Vommitting, Bowel movements are regular, No Blood in stool or Urine, No dysuria, No new skin rashes or bruises, No new joints pains-aches,  No new weakness, tingling, numbness in any extremity, No recent weight gain or loss, No polyuria, polydypsia or polyphagia, No significant Mental Stressors.  A full 10 point Review of Systems was done, except as stated above, all other Review of Systems were negative.   With Past History of the following :    Past Medical History:  Diagnosis Date   . Allergy   . Cancer ([place])    skin  . Hypertension       Past Surgical History:  Procedure Laterality Date  . salivary gland N/A 12/2012      Social History:     Social History   Tobacco Use  . Smoking status: Never Smoker  . Smokeless tobacco: Never Used  Substance Use Topics  . Alcohol use: No     Lives - at home  Mobility - walks by self      Family History :     Family History  Problem Relation Age of Onset  . Heart disease Mother        Home Medications:   Prior to Admission medications   Medication Sig Start Date End Date Taking? Authorizing Provider  alendronate (FOSAMAX) 35 MG tablet Take 1 tablet (35 mg total) by mouth every 7 (seven) days. Pt  needs appt prior to further refills 02/2017 03/06/17   Ivar Drape D, PA  amLODipine (NORVASC) 5 MG tablet Take 1 tablet (5 mg total) by mouth daily. Pt needs appt prior to further refills 02/2017 03/06/17   Ivar Drape D, PA  hydrochlorothiazide (HYDRODIURIL) 25 MG tablet TAKE 1 TABLET BY MOUTH TWO  TIMES DAILY 03/06/17   Leonie Douglas, PA-C     Allergies:     Allergies  Allergen Reactions  . Lisinopril      Physical Exam:   Vitals  Blood pressure 137/60, pulse 77, temperature 98.5 F (36.9 C), temperature source Oral, resp. rate 13, height 5' (1.524 m), weight 79.4 kg (175 lb), SpO2 97 %.   1. General  lying in bed in NAD,   2. Normal affect and insight, Not Suicidal or Homicidal, Awake Alert, Oriented X 3.  3. No F.N deficits, ALL C.Nerves Intact, Strength 5/5 all 4 extremities, Sensation intact all 4 extremities, Plantars down going.  4. Ears and Eyes appear Normal, Conjunctivae clear, PERRLA. Moist Oral Mucosa.  5. Supple Neck, No JVD, No cervical lymphadenopathy appriciated, No Carotid Bruits.  6. Symmetrical Chest wall movement, Good air movement bilaterally, CTAB.  7. RRR, No Gallops, Rubs or Murmurs, No Parasternal Heave.  8. Positive Bowel Sounds, Abdomen Soft, No  tenderness, No organomegaly appriciated,No rebound -guarding or rigidity.  9.  No Cyanosis, Normal Skin Turgor, No Skin Rash or Bruise.  10. Good muscle tone,  joints appear normal , no effusions, Normal ROM.  11. No Palpable Lymph Nodes in Neck or Axillae     Data Review:    CBC Recent Labs  Lab 03/15/18 1417  WBC 14.1*  HGB 13.0  HCT 38.6  PLT 196  MCV 82.0  MCH 27.6  MCHC 33.7  RDW 15.7*  LYMPHSABS 0.3*  MONOABS 0.4  EOSABS 0.1  BASOSABS 0.0   ------------------------------------------------------------------------------------------------------------------  Chemistries  Recent Labs  Lab 03/15/18 1417  NA 139  K 3.0*  CL 107  CO2 23  GLUCOSE 113*  BUN 24*  CREATININE 1.68*  CALCIUM 8.9  MG 2.1  AST 30  ALT 12  ALKPHOS 61  BILITOT 0.7   ------------------------------------------------------------------------------------------------------------------ estimated creatinine clearance is 24.9 mL/min (A) (by C-G formula based on SCr of 1.68 mg/dL (H)). ------------------------------------------------------------------------------------------------------------------ No results for input(s): TSH, T4TOTAL, T3FREE, THYROIDAB in the last 72 hours.  Invalid input(s): FREET3  Coagulation profile No results for input(s): INR, PROTIME in the last 168 hours. ------------------------------------------------------------------------------------------------------------------- No results for input(s): DDIMER in the last 72 hours. -------------------------------------------------------------------------------------------------------------------  Cardiac Enzymes No results for input(s): CKMB, TROPONINI, MYOGLOBIN in the last 168 hours.  Invalid input(s): CK ------------------------------------------------------------------------------------------------------------------ No results found for:  BNP   ---------------------------------------------------------------------------------------------------------------  Urinalysis    Component Value Date/Time   COLORURINE YELLOW 03/15/2018 [place] 03/15/2018 1356   LABSPEC 1.020 03/15/2018 1356   PHURINE 5.5 03/15/2018 1356   GLUCOSEU NEGATIVE 03/15/2018 1356   HGBUR NEGATIVE 03/15/2018 1356   [place] 03/15/2018 1356   [place] 03/15/2018 1356   PROTEINUR NEGATIVE 03/15/2018 1356   NITRITE NEGATIVE 03/15/2018 1356   LEUKOCYTESUR SMALL (A) 03/15/2018 1356    ----------------------------------------------------------------------------------------------------------------   Imaging Results:    Dg Chest 2 View  Result Date: 03/15/2018 CLINICAL DATA:  Cough EXAM: CHEST - 2 VIEW COMPARISON:  None. FINDINGS: Normal heart size. A tortuous atherosclerotic thoracic aorta. Otherwise normal mediastinal contour. No pneumothorax. No pleural effusion. No pulmonary edema. No acute consolidative airspace disease. Linear interstitial  prominence in the right greater than left lungs. IMPRESSION: No acute consolidative airspace disease to suggest a pneumonia. Nonspecific linear interstitial prominence in the right greater than left lungs, which could be due to nonspecific scarring. If there is clinical concern for interstitial lung disease, high-resolution chest CT could be obtained on a short term outpatient basis for further evaluation. Electronically Signed   By: Ilona Sorrel M.D.   On: 03/15/2018 13:28      Assessment & Plan:    Principal Problem:   Sepsis secondary to UTI [facility]) Active Problems:   AKI (acute kidney injury) ([place])   Hypokalemia   Acute lower UTI   Sepsis secondary to UTI Blood culture x2 Urine culture pending tx with rocephin 1gm iv qday  AKI Hydrate with ns iv Check cmp in am  Hypokalemia repleted in ED,  Check cmp in am  Hypertension HOLD hydrochlorothiazide due to  AKI Cont Amlodipine   ?tick bite Doxycycline 100mg  po bid  Diarrhea resolved per patient If continues consider stool studies   DVT Prophylaxis Lovenox - SCDs   AM Labs Ordered, also please review Full Orders  Family Communication: Admission, patients condition and plan of care including tests being ordered have been discussed with the patient  who indicate understanding and agree with the plan and Code Status.  Code Status FULL CODE  Likely DC to  home  Condition GUARDED    Consults called: none  Admission status: inpatient   Time spent in minutes : 60   Jani Gravel M.D on 03/15/2018 at 7:30 PM  Between 7am to 7pm - Pager - [phone] . After 7pm go to [email or web address] - password [facility]  [facility] - Office  [phone]

## 2018-03-15 NOTE — ED Provider Notes (Addendum)
[place] EMERGENCY DEPARTMENT Provider Note   CSN: [id number] Arrival date & time: 03/15/18  1218     History   Chief Complaint Chief Complaint  Patient presents with  . Fever    HPI Katherine Brooks is a 82 y.o. female with a past medical history of hypertension, hyperlipidemia who presents emergency department today for chills, generalized body aches, nausea/vomiting/diarrhea.  Patient states that she was in her normal state of health prior to last night when she started developing chills despite being under several blankets.  She notes that shortly after she started becoming nauseous and had 2 episodes of nonbilious, nonbloody emesis throughout the night.  Patient states that this was accompanied as well by several episodes of nonbilious, nonbloody diarrhea.  She notes she has had 2 episodes of emesis and 2 episodes of diarrhea this morning.  Patient states that she felt she may have a fever but was not sure she did not have a thermometer to check this.  She denies any antipyretic use prior to arrival.  Patient denies any medication use prior to arrival.  Patient reports that she has no associated headache, sore throat, neck stiffness, chest pain, shortness of breath, abdominal pain, flank pain, or urinary symptoms.  She states that she does have a mild, dry cough over the last 1-2 days.  She denies any associated hemoptysis or shortness of breath with this.  Patient denies any previous abdominal surgeries.  She notes no sick contacts.  She lives at home with her husband.  She notes that she has had several tick bites over the last 6 weeks after gardening.  She states she does not think that they ever were on long enough to become engorged but she is not sure.  She denies any new foods.  No one with similar symptoms as her.  No recent travel.  HPI  Past Medical History:  Diagnosis Date  . Allergy   . Cancer ([place])    skin  . Hypertension     Patient Active Problem List   Diagnosis Date Noted  . Renal insufficiency, mild 02/18/2011  . Osteoporosis 02/06/2011  . HTN (hypertension) 10/17/2010  . Hyperlipidemia 10/17/2010    Past Surgical History:  Procedure Laterality Date  . salivary gland N/A 12/2012     OB History   None      Home Medications    Prior to Admission medications   Medication Sig Start Date End Date Taking? Authorizing Provider  alendronate (FOSAMAX) 35 MG tablet Take 1 tablet (35 mg total) by mouth every 7 (seven) days. Pt needs appt prior to further refills 02/2017 03/06/17   Ivar Drape D, PA  amLODipine (NORVASC) 5 MG tablet Take 1 tablet (5 mg total) by mouth daily. Pt needs appt prior to further refills 02/2017 03/06/17   Ivar Drape D, PA  hydrochlorothiazide (HYDRODIURIL) 25 MG tablet TAKE 1 TABLET BY MOUTH TWO  TIMES DAILY 03/06/17   Leonie Douglas, PA-C    Family History Family History  Problem Relation Age of Onset  . Heart disease Mother     Social History Social History   Tobacco Use  . Smoking status: Never Smoker  . Smokeless tobacco: Never Used  Substance Use Topics  . Alcohol use: No  . Drug use: No     Allergies   Lisinopril   Review of Systems Review of Systems  All other systems reviewed and are negative.    Physical Exam Updated Vital Signs BP Marland Kitchen)  119/59 (BP Location: Left Arm)   Pulse (!) 112   Temp 99.1 F (37.3 C) (Oral)   Resp (!) 24   Ht 5' (1.524 m)   Wt 79.4 kg (175 lb)   SpO2 92%   BMI 34.18 kg/m   Physical Exam  Constitutional: She appears well-developed and well-nourished.  HENT:  Head: Normocephalic and atraumatic.  Right Ear: External ear normal.  Left Ear: External ear normal.  Nose: Nose normal.  Mouth/Throat: Uvula is midline, oropharynx is clear and moist and mucous membranes are normal. No oropharyngeal exudate. No tonsillar exudate.  Eyes: Pupils are equal, round, and reactive to light. Right eye exhibits no discharge. Left eye exhibits no  discharge. No scleral icterus.  Neck: Trachea normal. Neck supple. No spinous process tenderness present. No neck rigidity. Normal range of motion present.  No nuchal rigidity or meningismus  Cardiovascular: Normal rate, regular rhythm and intact distal pulses.  No murmur heard. Pulses:      Radial pulses are 2+ on the right side, and 2+ on the left side.       Dorsalis pedis pulses are 2+ on the right side, and 2+ on the left side.       Posterior tibial pulses are 2+ on the right side, and 2+ on the left side.  No lower extremity swelling or edema. Calves symmetric in size bilaterally.  Pulmonary/Chest: Effort normal and breath sounds normal. She exhibits no tenderness.  No increased work of breathing. No accessory muscle use. Patient is sitting upright, speaking in full sentences without difficulty   Abdominal: Soft. Bowel sounds are normal. She exhibits no distension. There is no tenderness. There is no rigidity, no rebound, no guarding and no CVA tenderness.  Musculoskeletal: She exhibits no edema.  Lymphadenopathy:    She has no cervical adenopathy.  Neurological: She is alert.  Speech clear. Follows commands. No facial droop. PERRLA. EOM grossly intact. CN III-XII grossly intact. Grossly moves all extremities 4 without ataxia. Able and appropriate strength for age to upper and lower extremities bilaterally.   Skin: Skin is warm and dry. No rash noted. She is not diaphoretic.  Right flank there is a small wound that is well healing with small amount of erythema and heat surrounding this. This measures ~3cm. There is no surrounding induration or centralized fluctuance.  No drainage.  No maculopapular rash.  No rash on palms or soles.  Psychiatric: She has a normal mood and affect.  Nursing note and vitals reviewed.    ED Treatments / Results  Labs (all labs ordered are listed, but only abnormal results are displayed) Labs Reviewed  COMPREHENSIVE METABOLIC PANEL - Abnormal; Notable  for the following components:      Result Value   Potassium 3.0 (*)    Glucose, Bld 113 (*)    BUN 24 (*)    Creatinine, Ser 1.68 (*)    GFR calc non Af Amer 28 (*)    GFR calc Af Amer 32 (*)    All other components within normal limits  CBC WITH DIFFERENTIAL/PLATELET - Abnormal; Notable for the following components:   WBC 14.1 (*)    RDW 15.7 (*)    Neutro Abs 13.2 (*)    Lymphs Abs 0.3 (*)    All other components within normal limits  URINALYSIS, ROUTINE W REFLEX MICROSCOPIC - Abnormal; Notable for the following components:   Leukocytes, UA SMALL (*)    All other components within normal limits  URINALYSIS, MICROSCOPIC (REFLEX) -  Abnormal; Notable for the following components:   Bacteria, UA FEW (*)    All other components within normal limits  CULTURE, BLOOD (ROUTINE X 2)  CULTURE, BLOOD (ROUTINE X 2)  URINE CULTURE  LIPASE, BLOOD  MAGNESIUM  ROCKY MTN SPOTTED FVR ABS PNL(IGG+IGM)  B. BURGDORFI ANTIBODIES  I-STAT CG4 LACTIC ACID, ED  I-STAT CG4 LACTIC ACID, ED    EKG EKG Interpretation  Date/Time:  "Sunday March 15 2018 15:06:18 EDT Ventricular Rate:  77 PR Interval:    QRS Duration: 141 QT Interval:  506 QTC Calculation: 573 R Axis:   31 Text Interpretation:  Sinus rhythm Left bundle branch block No old tracing to compare Confirmed by Pickering, Nathan ([id number]) on 03/15/2018 3:46:57 PM   Radiology Dg Chest 2 View  Result Date: 03/15/2018 CLINICAL DATA:  Cough EXAM: CHEST - 2 VIEW COMPARISON:  None. FINDINGS: Normal heart size. A tortuous atherosclerotic thoracic aorta. Otherwise normal mediastinal contour. No pneumothorax. No pleural effusion. No pulmonary edema. No acute consolidative airspace disease. Linear interstitial prominence in the right greater than left lungs. IMPRESSION: No acute consolidative airspace disease to suggest a pneumonia. Nonspecific linear interstitial prominence in the right greater than left lungs, which could be due to nonspecific scarring. If  there is clinical concern for interstitial lung disease, high-resolution chest CT could be obtained on a short term outpatient basis for further evaluation. Electronically Signed   By: Jason A Poff M.D.   On: 03/15/2018 13:28    Procedures Procedures (including critical care time) CRITICAL CARE Performed by: Alvilda Mckenna M Jonessa Triplett   Total critical care time: 45 minutes - sepsis  Critical care time was exclusive of separately billable procedures and treating other patients.  Critical care was necessary to treat or prevent imminent or life-threatening deterioration.  Critical care was time spent personally by me on the following activities: development of treatment plan with patient and/or surrogate as well as nursing, discussions with consultants, evaluation of patient's response to treatment, examination of patient, obtaining history from patient or surrogate, ordering and performing treatments and interventions, ordering and review of laboratory studies, ordering and review of radiographic studies, pulse oximetry and re-evaluation of patient's condition.    Medications Ordered in ED Medications  doxycycline (VIBRA-TABS) tablet 100 mg (100 mg Oral Given 03/15/18 1703)  0.9 %  sodium chloride infusion (has no administration in time range)  sodium chloride 0.9 % bolus 1,000 mL (0 mLs Intravenous Stopped 03/15/18 1545)  acetaminophen (TYLENOL) tablet 650 mg (650 mg Oral Given 03/15/18 1455)  ondansetron (ZOFRAN) injection 4 mg (4 mg Intravenous Given 03/15/18 1455)  potassium chloride SA (K-DUR,KLOR-CON) CR tablet 40 mEq (40 mEq Oral Given 03/15/18 1510)  sodium chloride 0.9 % bolus 500 mL (0 mLs Intravenous Stopped 03/15/18 1619)  acetaminophen (TYLENOL) tablet 325 mg (325 mg Oral Given 03/15/18 1545)  cefTRIAXone (ROCEPHIN) 1 g in sodium chloride 0.9 % 100 mL IVPB (0 g Intravenous Stopped 03/15/18 1757)  sodium chloride 0.9 % bolus 500 mL (500 mLs Intravenous New Bag/Given 03/15/18 1658)     Initial  Impression / Assessment and Plan / ED Course  I have reviewed the triage vital signs and the nursing notes.  Pertinent labs & imaging results that were available during my care of the patient were reviewed by me and considered in my medical decision making (see chart for details).     82"  year old female with past medical history of hypertension hyperlipidemia presenting to the emergency room today for subjective fever, chills, generalized body  aches, nausea/vomiting/diarrhea as well as recent tick exposure.  Patient is noted to be tachycardic at 112, tachypneic at 24 with mildly soft blood pressure 119/59 on presentation.  No fever.  Will check rectal temperature.  No hypoxia.  Patient is well-appearing.  Patient denies any abdominal tenderness.  She is without any CVA tenderness palpation, abdominal TTP or peritoneal signs.  Patient does have area to right flank with some surrounding erythema and heat where she says she was bitten by a tick.  There is no obvious bull's-eye sign.  No maculopapular rash throughout the body. No rash on palms or soles.  She is without any meningeal signs.  Will obtain blood cultures, lactic acid, basic blood work, UA, Lyme and [place] spotted fever titers to evaluate.  Will give IV fluids, nausea medication.  Rectal temperature with a reading of 101.  Tylenol given.  Patient with leukocytosis of 14.1.  No anemia.  Patient with hypokalemia of 3.0.  Magnesium within normal limits.  EKG with QT of 506.  Will avoid further Zofran/QT prolonging medication.  Potassium replaced orally.  Patient with elevation of creatinine at 1.68 and BUN of 24.  Patient's baseline is 1.29.  Likely secondary to emesis and diarrhea.  Will continue to give IV fluid.  Lactic acid is within normal limits.  Chest x-ray without evidence of pneumonia.  UA with question of UTI.  There is small leukocytes and 11-20 white blood cells.  Will culture urine give IV ceftriaxone for coverage.  Will cover for  tickborne illness with doxycycline  Repeat lactic acid with improvement.  However patient's blood pressure has softened (from 119/59 to now 95/46). HR and Temp have improved. Given patient with leukocytosis, hypotension, documented fever and tachycardia on arrival and source of UTI will call code sepsis. Additional fluids ordered to meet 30cc/kg (2500cc). Maintanace fluid ordered. Blood cultures already pending. Will cover for possible tick borne illness with doxycycline. RMSF and Lyme disease titers pending. Patient will need admission. Case seen with Dr. Alvino Chapel.   I appreciate Dr. Wyline Copas for admitting the patient to the hospitalists service.   Final Clinical Impressions(s) / ED Diagnoses   Final diagnoses:  Sepsis secondary to UTI [facility])  Tick bite, initial encounter  Hypokalemia    ED Discharge Orders    None       Jillyn Ledger, PA-C 03/15/18 1803    Jillyn Ledger, PA-C 03/15/18 Gerrit Halls, MD 03/16/18 [phone]

## 2018-03-16 ENCOUNTER — Encounter (HOSPITAL_COMMUNITY): Payer: Self-pay

## 2018-03-16 ENCOUNTER — Telehealth: Payer: Self-pay | Admitting: General Practice

## 2018-03-16 DIAGNOSIS — N179 Acute kidney failure, unspecified: Secondary | ICD-10-CM | POA: Diagnosis not present

## 2018-03-16 DIAGNOSIS — A419 Sepsis, unspecified organism: Secondary | ICD-10-CM | POA: Diagnosis not present

## 2018-03-16 DIAGNOSIS — E876 Hypokalemia: Secondary | ICD-10-CM

## 2018-03-16 DIAGNOSIS — N390 Urinary tract infection, site not specified: Secondary | ICD-10-CM | POA: Diagnosis not present

## 2018-03-16 DIAGNOSIS — I10 Essential (primary) hypertension: Secondary | ICD-10-CM | POA: Diagnosis not present

## 2018-03-16 LAB — CBC
HEMATOCRIT: 33.7 % — AB (ref 36.0–46.0)
Hemoglobin: 10.9 g/dL — ABNORMAL LOW (ref 12.0–15.0)
MCH: 27.8 pg (ref 26.0–34.0)
MCHC: 32.3 g/dL (ref 30.0–36.0)
MCV: 86 fL (ref 78.0–100.0)
Platelets: 183 10*3/uL (ref 150–400)
RBC: 3.92 MIL/uL (ref 3.87–5.11)
RDW: 16.2 % — AB (ref 11.5–15.5)
WBC: 8.4 10*3/uL (ref 4.0–10.5)

## 2018-03-16 LAB — COMPREHENSIVE METABOLIC PANEL
ALT: 11 U/L (ref 0–44)
AST: 22 U/L (ref 15–41)
Albumin: 2.8 g/dL — ABNORMAL LOW (ref 3.5–5.0)
Alkaline Phosphatase: 44 U/L (ref 38–126)
Anion gap: 6 (ref 5–15)
BUN: 20 mg/dL (ref 8–23)
CHLORIDE: 111 mmol/L (ref 98–111)
CO2: 23 mmol/L (ref 22–32)
Calcium: 7.9 mg/dL — ABNORMAL LOW (ref 8.9–10.3)
Creatinine, Ser: 1.36 mg/dL — ABNORMAL HIGH (ref 0.44–1.00)
GFR calc Af Amer: 41 mL/min — ABNORMAL LOW (ref >60)
GFR, EST NON AFRICAN AMERICAN: 36 mL/min — AB (ref >60)
Glucose, Bld: 88 mg/dL (ref 70–99)
Potassium: 3.4 mmol/L — ABNORMAL LOW (ref 3.5–5.1)
SODIUM: 140 mmol/L (ref 135–145)
Total Bilirubin: 0.5 mg/dL (ref 0.3–1.2)
Total Protein: 5.4 g/dL — ABNORMAL LOW (ref 6.5–8.1)

## 2018-03-16 MED ORDER — DIPHENHYDRAMINE-ZINC ACETATE 2-0.1 % EX CREA
TOPICAL_CREAM | Freq: Three times a day (TID) | CUTANEOUS | Status: DC | PRN
  Filled 2018-03-16: qty 28

## 2018-03-16 MED ORDER — POTASSIUM CHLORIDE CRYS ER 20 MEQ PO TBCR
40.0000 meq | EXTENDED_RELEASE_TABLET | Freq: Once | ORAL | Status: AC
  Administered 2018-03-16: 40 meq via ORAL
  Filled 2018-03-16: qty 2

## 2018-03-16 NOTE — Telephone Encounter (Signed)
Dr Raliegh Scarlet,  Pt has new patent appointment with you 04/25/18, but is in hospital and needs to be seen sooner.  Can you see patient sooner? Thanks  [place]

## 2018-03-16 NOTE — Telephone Encounter (Signed)
No, I am sorry but I would not be able to see her sooner as I only have certain slots for "new patient visits".   Sorry about that.   Therefore, I recommend, if she needs to be seen for a hospital follow-up OV sooner, she will need to keep office visits with her prior PCP for that to be done sooner.  Thank you

## 2018-03-16 NOTE — Telephone Encounter (Signed)
See below CRM  Can pt be worked in if so 30 or 45 min appointment Thanks  Copied from [place]. Topic: Appointment Scheduling - Scheduling Inquiry for Clinic >> Mar 16, 2018 11:13 AM Gardiner Ramus wrote: Reason for CRM: cookie RN case manager from [facility] called to see if Katherine Brooks or anyone in the office could work in a new pt/ hospital f/u. Please advise Cb#[phone].

## 2018-03-16 NOTE — Telephone Encounter (Signed)
Spoke with cooke @ [facility].  She  Wanted me to  call pt room to schedule appointment she gave me # [phone].  I mention to her that pt has appointment @ [facility] in [place].  Pt needed to be seen sooner. She wanted me to talk to pt.    I call pt room spoke with Mardene Celeste pt daughter  She stated will be discharged tomorrow and needs to be seen sooner than aug.  She stated she wanted an appointment with debbie for a one time appointment then establish care with [place] tried calling [facility] [phone] no answer will try after 1

## 2018-03-16 NOTE — Progress Notes (Addendum)
PROGRESS NOTE    Katherine Brooks  TKZ:[id number] DOB: March 30, 1936 DOA: 03/15/2018 PCP: Patient, No Pcp Per  Brief Narrative: Katherine Brooks  is a 82 y.o. female, w  Hypertension apparently c/o chills, myalgia, n/v, diarrhea.  Pt apparently noted tick bites over the past few weeks.  Pt will be admitted for w/up of UTI, sepsis (leukocytosis, fever, AKI) and AKI and evaluation of Tick bite.        Assessment & Plan:   Principal Problem:   Sepsis secondary to UTI [facility]) Active Problems:   AKI (acute kidney injury) ([place])   Hypokalemia   Acute lower UTI   Sepsis ([place])   Sepsis secondary to UTI.  Improved . Her fever resolved.  Urine cultures are pending.  She is on rocephin.  Lactic acid wnl.  Leukocytosis resolved.    AKI: PROBABLY pre renal.  Resume IV fluids and repeat creatinine is 1.36.    Hypokalemia:  Replaced.   Anemia: Baseline hemoglobin around 13.  Today its 10.9, suspect a component of  Dilution.  Repeat H&H in am.    Tick bites with erythematous rash on the sides; Itchy.  Started on doxycycline.  Lyme titires on admission ordered.    Hypertension Well controlled.        DVT prophylaxis: lovenox Code Status:  Full code.  Family Communication: family at bedside.  Disposition Plan:  D/c in am.    Consultants:  None.  Procedures:none.  Antimicrobials: rocephin and doxycycline since admission Subjective: She reports feeling fine.  No chest pain or sob.   Objective: Vitals:   03/15/18 1917 03/15/18 2145 03/16/18 0344 03/16/18 1406  BP: 137/60 (!) 103/55 (!) 119/52 135/62  Pulse: 77 64 67 68  Resp: 16 18 18 16   Temp: 98.5 F (36.9 C) 98.2 F (36.8 C) 98 F (36.7 C) 98.4 F (36.9 C)  TempSrc: Oral Oral Oral Oral  SpO2: 97% 94% 96% 98%  Weight: 84.6 kg (186 lb 9.6 oz)  84.6 kg (186 lb 9.6 oz)   Height: 5' (1.524 m)  5' (1.524 m)     Intake/Output Summary (Last 24 hours) at 03/16/2018 1427 Last data filed at 03/16/2018 1231 Gross per 24  hour  Intake 5483.75 ml  Output -  Net 5483.75 ml   Filed Weights   03/15/18 1234 03/15/18 1917 03/16/18 0344  Weight: 79.4 kg (175 lb) 84.6 kg (186 lb 9.6 oz) 84.6 kg (186 lb 9.6 oz)    Examination:  General exam: Appears calm and comfortable  Respiratory system: Clear to auscultation. Respiratory effort normal. Cardiovascular system: S1 & S2 heard, RRR. No JVD, Gastrointestinal system: Abdomen is nondistended, soft and nontender. No organomegaly or masses felt. Normal bowel sounds heard. Central nervous system: Alert and oriented. No focal neurological deficits. Extremities: Symmetric 5 x 5 power. Skin: erythematous rash on the lateral sides of the chest wall,  Psychiatry: Judgement and insight appear normal. Mood & affect appropriate.     Data Reviewed: I have personally reviewed following labs and imaging studies  CBC: Recent Labs  Lab 03/15/18 1417 03/16/18 0407  WBC 14.1* 8.4  NEUTROABS 13.2*  --   HGB 13.0 10.9*  HCT 38.6 33.7*  MCV 82.0 86.0  PLT 196 573   Basic Metabolic Panel: Recent Labs  Lab 03/15/18 1417 03/16/18 0407  NA 139 140  K 3.0* 3.4*  CL 107 111  CO2 23 23  GLUCOSE 113* 88  BUN 24* 20  CREATININE 1.68* 1.36*  CALCIUM 8.9 7.9*  MG 2.1  --    GFR: Estimated Creatinine Clearance: 31.8 mL/min (A) (by C-G formula based on SCr of 1.36 mg/dL (H)). Liver Function Tests: Recent Labs  Lab 03/15/18 1417 03/16/18 0407  AST 30 22  ALT 12 11  ALKPHOS 61 44  BILITOT 0.7 0.5  PROT 6.8 5.4*  ALBUMIN 3.5 2.8*   Recent Labs  Lab 03/15/18 1417  LIPASE 31   No results for input(s): AMMONIA in the last 168 hours. Coagulation Profile: No results for input(s): INR, PROTIME in the last 168 hours. Cardiac Enzymes: No results for input(s): CKTOTAL, CKMB, CKMBINDEX, TROPONINI in the last 168 hours. BNP (last 3 results) No results for input(s): PROBNP in the last 8760 hours. HbA1C: No results for input(s): HGBA1C in the last 72 hours. CBG: No  results for input(s): GLUCAP in the last 168 hours. Lipid Profile: No results for input(s): CHOL, HDL, LDLCALC, TRIG, CHOLHDL, LDLDIRECT in the last 72 hours. Thyroid Function Tests: No results for input(s): TSH, T4TOTAL, FREET4, T3FREE, THYROIDAB in the last 72 hours. Anemia Panel: No results for input(s): VITAMINB12, FOLATE, FERRITIN, TIBC, IRON, RETICCTPCT in the last 72 hours. Sepsis Labs: Recent Labs  Lab 03/15/18 1428 03/15/18 1625  LATICACIDVEN 1.49 1.05    No results found for this or any previous visit (from the past 240 hour(s)).       Radiology Studies: Dg Chest 2 View  Result Date: 03/15/2018 CLINICAL DATA:  Cough EXAM: CHEST - 2 VIEW COMPARISON:  None. FINDINGS: Normal heart size. A tortuous atherosclerotic thoracic aorta. Otherwise normal mediastinal contour. No pneumothorax. No pleural effusion. No pulmonary edema. No acute consolidative airspace disease. Linear interstitial prominence in the right greater than left lungs. IMPRESSION: No acute consolidative airspace disease to suggest a pneumonia. Nonspecific linear interstitial prominence in the right greater than left lungs, which could be due to nonspecific scarring. If there is clinical concern for interstitial lung disease, high-resolution chest CT could be obtained on a short term outpatient basis for further evaluation. Electronically Signed   By: Ilona Sorrel M.D.   On: 03/15/2018 13:28        Scheduled Meds: . amLODipine  2.5 mg Oral Daily  . doxycycline  100 mg Oral Q12H  . enoxaparin (LOVENOX) injection  30 mg Subcutaneous Q24H   Continuous Infusions: . 0.9 % NaCl with KCl 20 mEq / L 75 mL/hr at 03/15/18 2109  . cefTRIAXone (ROCEPHIN)  IV       LOS: 1 day    Time spent: 88 min    Hosie Poisson, MD [facility] Pager [phone] If 7PM-7AM, please contact night-coverage [email or web address] Password [facility] 03/16/2018, 2:27 PM

## 2018-03-16 NOTE — Telephone Encounter (Signed)
Looks like she has an appointment to establish care at [facility], will you see if that location is closer for her and see if she can get in there sooner.

## 2018-03-17 DIAGNOSIS — A419 Sepsis, unspecified organism: Secondary | ICD-10-CM | POA: Diagnosis not present

## 2018-03-17 DIAGNOSIS — N179 Acute kidney failure, unspecified: Secondary | ICD-10-CM | POA: Diagnosis not present

## 2018-03-17 DIAGNOSIS — I10 Essential (primary) hypertension: Secondary | ICD-10-CM

## 2018-03-17 DIAGNOSIS — N390 Urinary tract infection, site not specified: Secondary | ICD-10-CM | POA: Diagnosis not present

## 2018-03-17 LAB — URINE CULTURE

## 2018-03-17 LAB — B. BURGDORFI ANTIBODIES

## 2018-03-17 LAB — BASIC METABOLIC PANEL
Anion gap: 8 (ref 5–15)
BUN: 19 mg/dL (ref 8–23)
CHLORIDE: 111 mmol/L (ref 98–111)
CO2: 21 mmol/L — ABNORMAL LOW (ref 22–32)
CREATININE: 1.35 mg/dL — AB (ref 0.44–1.00)
Calcium: 8.6 mg/dL — ABNORMAL LOW (ref 8.9–10.3)
GFR calc Af Amer: 42 mL/min — ABNORMAL LOW (ref >60)
GFR calc non Af Amer: 36 mL/min — ABNORMAL LOW (ref >60)
GLUCOSE: 88 mg/dL (ref 70–99)
POTASSIUM: 4.5 mmol/L (ref 3.5–5.1)
Sodium: 140 mmol/L (ref 135–145)

## 2018-03-17 MED ORDER — CEPHALEXIN 500 MG PO CAPS: 500.0000 mg | ORAL_CAPSULE | Freq: Two times a day (BID) | ORAL | 0 refills | Status: AC

## 2018-03-17 MED ORDER — AMLODIPINE BESYLATE 5 MG PO TABS: 5.0000 mg | ORAL_TABLET | Freq: Every day | ORAL | 0 refills | Status: DC

## 2018-03-17 MED ORDER — DOXYCYCLINE HYCLATE 100 MG PO TABS: 100.0000 mg | ORAL_TABLET | Freq: Two times a day (BID) | ORAL | 0 refills | Status: AC

## 2018-03-17 NOTE — Care Management Obs Status (Signed)
[place] NOTIFICATION   Patient Details  Name: ELTHA TINGLEY MRN: [id number] Date of Birth: Mar 01, 1936   Medicare Observation Status Notification Given:  Yes    McGibboneyOletta Darter, RN 03/17/2018, 11:55 AM

## 2018-03-17 NOTE — Care Management CC44 (Signed)
Condition Code 44 Documentation Completed  Patient Details  Name: QUERIDA BERETTA MRN: [id number] Date of Birth: [birth year]   Condition Code 44 given:  Yes Patient signature on Condition Code 44 notice:  Yes Documentation of 2 MD's agreement:  Yes Code 44 added to claim:  Yes    Haila Dena, RN 03/17/2018, 11:55 AM

## 2018-03-17 NOTE — Progress Notes (Signed)
Reviewed discharge information with patient and caregiver. Answered all questions. Patient/caregiver able to teach back medications and reasons to contact MD/911. Patient verbalizes importance of PCP follow up appointment.  Juniper Cobey M. Maxine Fredman, RN  

## 2018-03-17 NOTE — Telephone Encounter (Signed)
I spoke with [facility] to see if they would work patient in for the hospital follow up with NP but they declined. I was told pt would have to establish care in an appropriate slot- no exceptions. I called pt and spoke with daughter Mardene Celeste and set up appt 7/12 @ 11:45 with Tor Netters.

## 2018-03-18 LAB — ROCKY MTN SPOTTED FVR ABS PNL(IGG+IGM)
RMSF IGG: NEGATIVE
RMSF IgM: 0.51 index (ref 0.00–0.89)

## 2018-03-18 NOTE — Discharge Summary (Signed)
Physician Discharge Summary  Katherine Brooks TKW:[id number] DOB: September 20, 1935 DOA: 03/15/2018  PCP: Patient, No Pcp Per  Admit date: 03/15/2018 Discharge date: 03/17/2018  Admitted From: Home.  Disposition:  Home.   Recommendations for Outpatient Follow-up:  1. Follow up with PCP in 1-2 weeks 2. Please obtain BMP/CBC in one week 3. Please follow up the lyme titre's.    Discharge Condition: stable.  CODE STATUS: full code.  Diet recommendation: Heart Healthy   Brief/Interim Summary: VidaCauseyis a59 y.o.female,w Hypertension apparently c/o chills, myalgia, n/v, diarrhea. Pt apparently noted tick bites over the past few weeks. Pt will be admitted for w/up of UTI, sepsis (leukocytosis, fever, AKI) and AKI and evaluation of Tick bite.    Discharge Diagnoses:  Principal Problem:   Sepsis secondary to UTI [facility]) Active Problems:   AKI (acute kidney injury) ([place])   Hypokalemia   Acute lower UTI   Sepsis ([place])  Sepsis secondary to UTI.  Improved . Her fever resolved.  Urine cultures show multiple bacteria species.  She was started  on rocephin, transitioned to keflex on discharge.   Lactic acid wnl.  Leukocytosis resolved.    AKI: PROBABLY pre renal.  Resume IV fluids and repeat creatinine is 1.36.  Recommend checking creatinine in one week.   Hypokalemia:  Replaced.   Anemia: Baseline hemoglobin around 13.  Today its 10.9, suspect a component of  Dilution.  Check cbc in one week.    Tick bites with erythematous rash on the sides; Itchy.  Started on doxycycline.  Lyme titires on admission ordered.    Hypertension Well controlled.        Discharge Instructions  Discharge Instructions    Diet - low sodium heart healthy   Complete by:  As directed    Discharge instructions   Complete by:  As directed    Follow up with LYME TITRE'S with PCP.  Please check bmp to check potassium in one week.     Allergies as of 03/17/2018      Reactions   Lisinopril Other (See Comments)   "passed out"      Medication List    STOP taking these medications   alendronate 35 MG tablet Commonly known as:  FOSAMAX   hydrochlorothiazide 25 MG tablet Commonly known as:  HYDRODIURIL     TAKE these medications   amLODipine 5 MG tablet Commonly known as:  NORVASC Take 1 tablet (5 mg total) by mouth daily. Pt needs appt prior to further refills 02/2017   cephALEXin 500 MG capsule Commonly known as:  KEFLEX Take 1 capsule (500 mg total) by mouth 2 (two) times daily for 3 days.   doxycycline 100 MG tablet Commonly known as:  VIBRA-TABS Take 1 tablet (100 mg total) by mouth every 12 (twelve) hours for 11 days.       Allergies  Allergen Reactions  . Lisinopril Other (See Comments)    "passed out"    Consultations:  None.    Procedures/Studies: Dg Chest 2 View  Result Date: 03/15/2018 CLINICAL DATA:  Cough EXAM: CHEST - 2 VIEW COMPARISON:  None. FINDINGS: Normal heart size. A tortuous atherosclerotic thoracic aorta. Otherwise normal mediastinal contour. No pneumothorax. No pleural effusion. No pulmonary edema. No acute consolidative airspace disease. Linear interstitial prominence in the right greater than left lungs. IMPRESSION: No acute consolidative airspace disease to suggest a pneumonia. Nonspecific linear interstitial prominence in the right greater than left lungs, which could be due to nonspecific scarring. If there is clinical  concern for interstitial lung disease, high-resolution chest CT could be obtained on a short term outpatient basis for further evaluation. Electronically Signed   By: Ilona Sorrel M.D.   On: 03/15/2018 13:28      Subjective: No chest pain. Or sob.   Discharge Exam: Vitals:   03/16/18 2109 03/17/18 0529  BP: (!) 133/57 (!) 142/72  Pulse: 64 65  Resp: 18   Temp: 98.9 F (37.2 C) 98.3 F (36.8 C)  SpO2: 98% 98%   Vitals:   03/16/18 0344 03/16/18 1406 03/16/18 2109 03/17/18 0529  BP: (!) 119/52  135/62 (!) 133/57 (!) 142/72  Pulse: 67 68 64 65  Resp: 18 16 18    Temp: 98 F (36.7 C) 98.4 F (36.9 C) 98.9 F (37.2 C) 98.3 F (36.8 C)  TempSrc: Oral Oral Oral Oral  SpO2: 96% 98% 98% 98%  Weight: 84.6 kg (186 lb 9.6 oz)   86.1 kg (189 lb 13.1 oz)  Height: 5' (1.524 m)       General: Pt is alert, awake, not in acute distress Cardiovascular: RRR, S1/S2 +, no rubs, no gallops Respiratory: CTA bilaterally, no wheezing, no rhonchi Abdominal: Soft, NT, ND, bowel sounds +     The results of significant diagnostics from this hospitalization (including imaging, microbiology, ancillary and laboratory) are listed below for reference.     Microbiology: Recent Results (from the past 240 hour(s))  Urine culture     Status: Abnormal   Collection Time: 03/15/18  1:56 PM  Result Value Ref Range Status   Specimen Description   Final    URINE, RANDOM Performed at [facility], [address] [place], [place] [address]    Special Requests   Final    NONE Performed at [facility], [address] [place], [place] [address]    Culture MULTIPLE SPECIES PRESENT, SUGGEST RECOLLECTION (A)  Final   Report Status 03/17/2018 FINAL  Final  Culture, blood (routine x 2)     Status: None (Preliminary result)   Collection Time: 03/15/18  2:10 PM  Result Value Ref Range Status   Specimen Description   Final    BLOOD RIGHT ANTECUBITAL Performed at [facility], [address] [place], [place] [address]    Special Requests   Final    BOTTLES DRAWN AEROBIC AND ANAEROBIC Blood Culture adequate volume Performed at [facility], [address] [place], [place] [address]    Culture   Final    NO GROWTH 1 DAY Performed at [facility], [place] [address], [place], [place] [address]    Report Status PENDING  Incomplete  Culture, blood (routine x 2)     Status: None (Preliminary result)   Collection Time: 03/15/18  2:28 PM  Result Value Ref Range  Status   Specimen Description   Final    BLOOD RIGHT ANTECUBITAL Performed at [facility], [address] [place], [place] [address]    Special Requests   Final    BOTTLES DRAWN AEROBIC AND ANAEROBIC Blood Culture adequate volume Performed at [facility], [address], [place], [place] [address]    Culture   Final    NO GROWTH 1 DAY Performed at [facility], [place] [address] [place], [place] [address]    Report Status PENDING  Incomplete     Labs: BNP (last 3 results) No results for input(s): BNP  in the last 8760 hours. Basic Metabolic Panel: Recent Labs  Lab 03/15/18 1417 03/16/18 0407 03/17/18 0416  NA 139 140 140  K 3.0* 3.4* 4.5  CL 107 111 111  CO2 23 23 21*  GLUCOSE 113* 88 88  BUN 24* 20 19  CREATININE 1.68* 1.36* 1.35*  CALCIUM 8.9 7.9* 8.6*  MG 2.1  --   --    Liver Function Tests: Recent Labs  Lab 03/15/18 1417 03/16/18 0407  AST 30 22  ALT 12 11  ALKPHOS 61 44  BILITOT 0.7 0.5  PROT 6.8 5.4*  ALBUMIN 3.5 2.8*   Recent Labs  Lab 03/15/18 1417  LIPASE 31   No results for input(s): AMMONIA in the last 168 hours. CBC: Recent Labs  Lab 03/15/18 1417 03/16/18 0407  WBC 14.1* 8.4  NEUTROABS 13.2*  --   HGB 13.0 10.9*  HCT 38.6 33.7*  MCV 82.0 86.0  PLT 196 183   Cardiac Enzymes: No results for input(s): CKTOTAL, CKMB, CKMBINDEX, TROPONINI in the last 168 hours. BNP: Invalid input(s): POCBNP CBG: No results for input(s): GLUCAP in the last 168 hours. D-Dimer No results for input(s): DDIMER in the last 72 hours. Hgb A1c No results for input(s): HGBA1C in the last 72 hours. Lipid Profile No results for input(s): CHOL, HDL, LDLCALC, TRIG, CHOLHDL, LDLDIRECT in the last 72 hours. Thyroid function studies No results for input(s): TSH, T4TOTAL, T3FREE, THYROIDAB in the last 72 hours.  Invalid input(s): FREET3 Anemia work up No results for input(s): VITAMINB12, FOLATE, FERRITIN, TIBC, IRON, RETICCTPCT in  the last 72 hours. Urinalysis    Component Value Date/Time   COLORURINE YELLOW 03/15/2018 [place] 03/15/2018 1356   LABSPEC 1.020 03/15/2018 1356   PHURINE 5.5 03/15/2018 1356   GLUCOSEU NEGATIVE 03/15/2018 1356   HGBUR NEGATIVE 03/15/2018 1356   [place] 03/15/2018 [place] 03/15/2018 1356   PROTEINUR NEGATIVE 03/15/2018 1356   NITRITE NEGATIVE 03/15/2018 1356   LEUKOCYTESUR SMALL (A) 03/15/2018 1356   Sepsis Labs Invalid input(s): PROCALCITONIN,  WBC,  LACTICIDVEN Microbiology Recent Results (from the past 240 hour(s))  Urine culture     Status: Abnormal   Collection Time: 03/15/18  1:56 PM  Result Value Ref Range Status   Specimen Description   Final    URINE, RANDOM Performed at [facility], [address] [place], [place] [address]    Special Requests   Final    NONE Performed at [facility], [address] [place], [place] [address]    Culture MULTIPLE SPECIES PRESENT, SUGGEST RECOLLECTION (A)  Final   Report Status 03/17/2018 FINAL  Final  Culture, blood (routine x 2)     Status: None (Preliminary result)   Collection Time: 03/15/18  2:10 PM  Result Value Ref Range Status   Specimen Description   Final    BLOOD RIGHT ANTECUBITAL Performed at [facility], [address] [place], [place] [address]    Special Requests   Final    BOTTLES DRAWN AEROBIC AND ANAEROBIC Blood Culture adequate volume Performed at [facility], [address] [place], [place] [address]    Culture   Final    NO GROWTH 1 DAY Performed at [facility], [place] [address] [place], [place] [address]    Report Status PENDING  Incomplete  Culture, blood (routine x 2)     Status: None (Preliminary result)  Collection Time: 03/15/18  2:28 PM  Result Value Ref Range Status   Specimen Description   Final    BLOOD RIGHT ANTECUBITAL Performed at [facility], [address] [place], [place] [address]    Special Requests   Final    BOTTLES DRAWN AEROBIC AND ANAEROBIC Blood Culture adequate volume Performed at [facility], [address] [place], [place] [address]    Culture   Final    NO GROWTH 1 DAY Performed at [facility], [place] [address], [place], [place] [address]    Report Status PENDING  Incomplete     Time coordinating discharge: 33  minutes  SIGNED:   Hosie Poisson, MD  [facility] 03/18/2018, 12:41 PM Pager   If 7PM-7AM, please contact night-coverage [email or web address] Password [email or web address]

## 2018-03-20 ENCOUNTER — Ambulatory Visit: Payer: Medicare Other | Admitting: Family Medicine

## 2018-03-20 ENCOUNTER — Encounter: Payer: Self-pay | Admitting: Family Medicine

## 2018-03-20 VITALS — BP 136/72 | HR 84 | Temp 98.1°F | Ht 60.0 in | Wt 183.5 lb

## 2018-03-20 DIAGNOSIS — Z23 Encounter for immunization: Secondary | ICD-10-CM | POA: Diagnosis not present

## 2018-03-20 DIAGNOSIS — M810 Age-related osteoporosis without current pathological fracture: Secondary | ICD-10-CM

## 2018-03-20 DIAGNOSIS — E669 Obesity, unspecified: Secondary | ICD-10-CM | POA: Insufficient documentation

## 2018-03-20 DIAGNOSIS — Z09 Encounter for follow-up examination after completed treatment for conditions other than malignant neoplasm: Secondary | ICD-10-CM

## 2018-03-20 DIAGNOSIS — I10 Essential (primary) hypertension: Secondary | ICD-10-CM

## 2018-03-20 LAB — VITAMIN D 25 HYDROXY (VIT D DEFICIENCY, FRACTURES): VITD: 26.18 ng/mL — ABNORMAL LOW (ref 30.00–100.00)

## 2018-03-20 LAB — CBC WITH DIFFERENTIAL/PLATELET
BASOS PCT: 1.1 % (ref 0.0–3.0)
Basophils Absolute: 0.1 10*3/uL (ref 0.0–0.1)
EOS ABS: 0.5 10*3/uL (ref 0.0–0.7)
Eosinophils Relative: 5.4 % — ABNORMAL HIGH (ref 0.0–5.0)
HEMATOCRIT: 40.8 % (ref 36.0–46.0)
Hemoglobin: 13.5 g/dL (ref 12.0–15.0)
Lymphocytes Relative: 32 % (ref 12.0–46.0)
Lymphs Abs: 3.2 10*3/uL (ref 0.7–4.0)
MCHC: 33 g/dL (ref 30.0–36.0)
MCV: 83.1 fl (ref 78.0–100.0)
Monocytes Absolute: 0.7 10*3/uL (ref 0.1–1.0)
Monocytes Relative: 7.3 % (ref 3.0–12.0)
NEUTROS ABS: 5.4 10*3/uL (ref 1.4–7.7)
Neutrophils Relative %: 54.2 % (ref 43.0–77.0)
Platelets: 279 10*3/uL (ref 150.0–400.0)
RBC: 4.92 Mil/uL (ref 3.87–5.11)
RDW: 15.8 % — AB (ref 11.5–15.5)
WBC: 10 10*3/uL (ref 4.0–10.5)

## 2018-03-20 LAB — BASIC METABOLIC PANEL
BUN: 21 mg/dL (ref 6–23)
CALCIUM: 9.8 mg/dL (ref 8.4–10.5)
CO2: 25 mEq/L (ref 19–32)
Chloride: 108 mEq/L (ref 96–112)
Creatinine, Ser: 1.27 mg/dL — ABNORMAL HIGH (ref 0.40–1.20)
GFR: 43.01 mL/min — AB (ref >60.00)
Glucose, Bld: 90 mg/dL (ref 70–99)
POTASSIUM: 5.1 meq/L (ref 3.5–5.1)
SODIUM: 142 meq/L (ref 135–145)

## 2018-03-20 LAB — TSH: TSH: 2.44 u[IU]/mL (ref 0.35–4.50)

## 2018-03-20 MED ORDER — AMLODIPINE BESYLATE 5 MG PO TABS: 5.0000 mg | ORAL_TABLET | Freq: Every day | ORAL | 3 refills | Status: DC

## 2018-03-20 NOTE — Progress Notes (Signed)
Subjective:    Patient ID: Katherine Brooks, female    DOB: [birth year], 82 y.o.   MRN: [id number]  HPI This is an 82 yo female, accompanied by her daughter, Katherine Brooks (visiting from [place]) who presents today for hospital follow up and to establish care. She lives with her husband. Has 3 dogs and 1 cat.   Hospital course- Patient was admitted 03/15/18-03/17/18 with UTI sepsis, AKI, hypokalemia. She had multiple tick bites prior to hospital encounter and was treated with doxycycline. Her B burgdorferi and rocky mountain spotted fever titers were negative.  Since discharge she has been feeling well. Gradually building up to normal activity level.   Last CPE- unsure, was getting regular care at PCP, her provider retired then her next provider left. Mammo- declines Tdap- declines, will check with her pharmacy Flu- annual Exercise- has large garden  Diet- doesn't eat much meat, adverse, ? Childhood, eats vegetables, drinks diet, decaf soda x 1 a day, no sweet tea or juice, breakfast- grits/toast, Lance crackers. Lunch- cooks vegetables, biscuits/cornbread, dinner- leftovers. Feels like she is unable to lose weight. Eats a lot of carb heavy foods, some sweets.   Osteoporosis- was on Fosamax for long time. Last bone scan was 3 years ago. She is interested in going back on Fosamax.   ROS- no headache or dizziness, no CP/SOB, no abdominal pain/constipation or diarrhea, no dysuria/frequency, no muscle or joint pain. Has a bruised area on abdomen from Lovenox. Has 2 small areas of redness where she removed ticks 2-3 weeks ago.   Current Meds  Medication Sig  . amLODipine (NORVASC) 5 MG tablet Take 1 tablet (5 mg total) by mouth daily. Pt needs appt prior to further refills 02/2017  . cephALEXin (KEFLEX) 500 MG capsule Take 1 capsule (500 mg total) by mouth 2 (two) times daily for 3 days.  Marland Kitchen doxycycline (VIBRA-TABS) 100 MG tablet Take 1 tablet (100 mg total) by mouth every 12 (twelve) hours for 11 days.        Past Medical History:  Diagnosis Date  . Allergy   . Cancer ([place])    skin  . Hypertension    Past Surgical History:  Procedure Laterality Date  . salivary gland N/A 12/2012   Family History  Problem Relation Age of Onset  . Heart disease Mother    Social History   Tobacco Use  . Smoking status: Never Smoker  . Smokeless tobacco: Never Used  Substance Use Topics  . Alcohol use: No  . Drug use: No      Review of Systems Per HPI    Objective:   Physical Exam  Constitutional: She is oriented to person, place, and time. She appears well-developed and well-nourished. No distress.  HENT:  Head: Normocephalic and atraumatic.  Eyes: Conjunctivae are normal.  Cardiovascular: Normal rate, regular rhythm and normal heart sounds.  Pulmonary/Chest: Effort normal and breath sounds normal.  Musculoskeletal: She exhibits no edema.  Neurological: She is alert and oriented to person, place, and time.  Skin: Skin is warm and dry. She is not diaphoretic.  Left side of abdomen with approximately 8 cm area ecchymosis.  Left and right lateral sides each with singular 3-4 mm erythema, slightly raised, no drainage.   Psychiatric: She has a normal mood and affect. Her behavior is normal. Judgment and thought content normal.  Vitals reviewed.     BP 136/72 (BP Location: Left Arm, Patient Position: Sitting, Cuff Size: Large)   Pulse 84   Temp 98.1  F (36.7 C) (Oral)   Ht 5' (1.524 m)   Wt 183 lb 8 oz (83.2 kg)   SpO2 97%   BMI 35.84 kg/m  Wt Readings from Last 3 Encounters:  03/20/18 183 lb 8 oz (83.2 kg)  03/17/18 189 lb 13.1 oz (86.1 kg)  03/01/16 182 lb (82.6 kg)       Assessment & Plan:  1. Hospital discharge follow-up - Reviewed hospital course, current medications, expectations for recovery, RTC/ER precautions  2. Essential hypertension - well controlled today - amLODipine (NORVASC) 5 MG tablet; Take 1 tablet (5 mg total) by mouth daily. Pt needs appt prior to further  refills 02/2017  Dispense: 90 tablet; Refill: 3 - CBC with Differential - Basic Metabolic Panel - TSH  3. Age-related osteoporosis without current pathological fracture - she is interested in restarting Fosamax, will check bone density and Vitamin D - DG Bone Density; Future - VITAMIN D 25 Hydroxy (Vit-D Deficiency, Fractures)  4. Need for pneumococcal vaccination - Pneumococcal polysaccharide vaccine 23-valent greater than or equal to 2yo subcutaneous/IM  5. Obesity (BMI 35.0-39.9 without comorbidity) - Reviewed her current diet and made suggestions for increasing dietary protein, increasing vegetables and fruits and decreasing processed carbs.  - TSH - VITAMIN D 25 Hydroxy (Vit-D Deficiency, Fractures)   Clarene Reamer, FNP-BC  [facility], [facility]  03/20/2018 12:59 PM

## 2018-03-20 NOTE — Patient Instructions (Signed)
It was a pleasure to meet you today! I look forward to partnering with you for your health care needs  Please stop at the front desk and schedule your bone density test and 3 month follow up for complete physical exam  Stop at the lab  I will notify you next week of lab results

## 2018-03-21 LAB — CULTURE, BLOOD (ROUTINE X 2)
CULTURE: NO GROWTH
CULTURE: NO GROWTH
SPECIAL REQUESTS: ADEQUATE
Special Requests: ADEQUATE

## 2018-03-26 LAB — HM DEXA SCAN

## 2018-03-30 ENCOUNTER — Encounter: Payer: Self-pay | Admitting: Family Medicine

## 2018-04-14 ENCOUNTER — Encounter: Payer: Self-pay | Admitting: Family Medicine

## 2018-04-15 ENCOUNTER — Ambulatory Visit: Payer: Medicare Other | Admitting: Family Medicine

## 2018-04-22 ENCOUNTER — Encounter: Payer: Self-pay | Admitting: Family Medicine

## 2018-05-05 ENCOUNTER — Telehealth: Payer: Self-pay

## 2018-05-05 NOTE — Telephone Encounter (Signed)
Please call patient's daughter and let her know that I recommend the Fluzone high dose flu vaccine for patients 72 and older.

## 2018-05-05 NOTE — Telephone Encounter (Signed)
Copied from [place]. Topic: General - Other >> May 05, 2018 10:13 AM Yvette Rack wrote: Reason for CRM: Pt daughter Mardene Celeste wants to know which of flu vaccines is recommended for pt age flu block or flu zone. Cb# [phone]

## 2018-05-08 NOTE — Telephone Encounter (Signed)
Called and spoke with patients daughter informing her of which flu shot was needed. Understanding verbalized nothing further needed at this time.

## 2018-06-23 ENCOUNTER — Other Ambulatory Visit: Payer: Self-pay | Admitting: Family Medicine

## 2018-06-23 DIAGNOSIS — E669 Obesity, unspecified: Secondary | ICD-10-CM

## 2018-06-23 DIAGNOSIS — N289 Disorder of kidney and ureter, unspecified: Secondary | ICD-10-CM

## 2018-06-24 ENCOUNTER — Other Ambulatory Visit (INDEPENDENT_AMBULATORY_CARE_PROVIDER_SITE_OTHER): Payer: Medicare Other

## 2018-06-24 DIAGNOSIS — N289 Disorder of kidney and ureter, unspecified: Secondary | ICD-10-CM

## 2018-06-24 DIAGNOSIS — E669 Obesity, unspecified: Secondary | ICD-10-CM | POA: Diagnosis not present

## 2018-06-24 LAB — BASIC METABOLIC PANEL
BUN: 21 mg/dL (ref 6–23)
CHLORIDE: 106 meq/L (ref 96–112)
CO2: 27 mEq/L (ref 19–32)
Calcium: 9.5 mg/dL (ref 8.4–10.5)
Creatinine, Ser: 1.37 mg/dL — ABNORMAL HIGH (ref 0.40–1.20)
GFR: 39.38 mL/min — ABNORMAL LOW (ref >60.00)
GLUCOSE: 101 mg/dL — AB (ref 70–99)
POTASSIUM: 4.7 meq/L (ref 3.5–5.1)
Sodium: 139 mEq/L (ref 135–145)

## 2018-06-24 LAB — LIPID PANEL
Cholesterol: 191 mg/dL (ref 0–200)
HDL: 60.5 mg/dL (ref >39.00)
LDL CALC: 116 mg/dL — AB (ref 0–99)
NonHDL: 130.45
Total CHOL/HDL Ratio: 3
Triglycerides: 71 mg/dL (ref 0.0–149.0)
VLDL: 14.2 mg/dL (ref 0.0–40.0)

## 2018-06-24 LAB — HEMOGLOBIN A1C: Hgb A1c MFr Bld: 5.6 % (ref 4.6–6.5)

## 2018-07-01 ENCOUNTER — Ambulatory Visit (INDEPENDENT_AMBULATORY_CARE_PROVIDER_SITE_OTHER): Payer: Medicare Other | Admitting: Family Medicine

## 2018-07-01 ENCOUNTER — Encounter: Payer: Self-pay | Admitting: Family Medicine

## 2018-07-01 VITALS — BP 140/70 | HR 68 | Ht 60.05 in | Wt 177.8 lb

## 2018-07-01 DIAGNOSIS — E559 Vitamin D deficiency, unspecified: Secondary | ICD-10-CM

## 2018-07-01 DIAGNOSIS — E669 Obesity, unspecified: Secondary | ICD-10-CM | POA: Diagnosis not present

## 2018-07-01 DIAGNOSIS — M810 Age-related osteoporosis without current pathological fracture: Secondary | ICD-10-CM | POA: Diagnosis not present

## 2018-07-01 DIAGNOSIS — Z0000 Encounter for general adult medical examination without abnormal findings: Secondary | ICD-10-CM

## 2018-07-01 DIAGNOSIS — R7989 Other specified abnormal findings of blood chemistry: Secondary | ICD-10-CM

## 2018-07-01 NOTE — Progress Notes (Signed)
Subjective:    Katherine Brooks is a 82 y.o. female who presents for Medicare Annual/Subsequent preventive examination.  Preventive Screening-Counseling & Management  Tobacco Social History   Tobacco Use  Smoking Status Never Smoker  Smokeless Tobacco Never Used     Problems Prior to Visit 1.   Current Problems (verified) Patient Active Problem List   Diagnosis Date Noted  . Obesity (BMI 35.0-39.9 without comorbidity) 03/20/2018  . Sepsis secondary to UTI ([place]) 03/15/2018  . AKI (acute kidney injury) ([place]) 03/15/2018  . Hypokalemia 03/15/2018  . Acute lower UTI 03/15/2018  . Sepsis ([place]) 03/15/2018  . Renal insufficiency, mild 02/18/2011  . Osteoporosis 02/06/2011  . HTN (hypertension) 10/17/2010  . Hyperlipidemia 10/17/2010    Medications Prior to Visit Current Outpatient Medications on File Prior to Visit  Medication Sig Dispense Refill  . amLODipine (NORVASC) 5 MG tablet Take 1 tablet (5 mg total) by mouth daily. Pt needs appt prior to further refills 02/2017 90 tablet 3   No current facility-administered medications on file prior to visit.     Current Medications (verified) Current Outpatient Medications  Medication Sig Dispense Refill  . amLODipine (NORVASC) 5 MG tablet Take 1 tablet (5 mg total) by mouth daily. Pt needs appt prior to further refills 02/2017 90 tablet 3   No current facility-administered medications for this visit.      Allergies (verified) Lisinopril   PAST HISTORY  Family History Family History  Problem Relation Age of Onset  . Heart disease Mother     Social History Social History   Tobacco Use  . Smoking status: Never Smoker  . Smokeless tobacco: Never Used  Substance Use Topics  . Alcohol use: No     Are there smokers in your home (other than you)? No  Risk Factors Current exercise habits: The patient does not participate in regular exercise at present.  Dietary issues discussed: patient watching diet but unable to lose  weight. Drinks 2 big glasses of whole milk daily   Cardiac risk factors: advanced age (older than 54 for men, 35 for women).  Depression Screen (Note: if answer to either of the following is "Yes", a more complete depression screening is indicated)   Over the past two weeks, have you felt down, depressed or hopeless? No  Over the past two weeks, have you felt little interest or pleasure in doing things? No  Have you lost interest or pleasure in daily life? No  Do you often feel hopeless? No  Do you cry easily over simple problems? No  Activities of Daily Living In your present state of health, do you have any difficulty performing the following activities?:  Driving? No Managing money?  No Feeding yourself? No Getting from bed to chair? NoBP 140/70 (BP Location: Right Arm, Patient Position: Sitting)   Pulse 68   Ht 5' 0.05" (1.525 m)   Wt 177 lb 12.8 oz (80.6 kg)   SpO2 98%   BMI 34.67 kg/m  General appearance: alert, cooperative, appears stated age, no distress and moderately obese Head: Normocephalic, without obvious abnormality, atraumatic Eyes: negative Ears: normal TM's and external ear canals both ears Nose: Nares normal. Septum midline. Mucosa normal. No drainage or sinus tenderness. Throat: lips, mucosa, and tongue normal; teeth and gums normal Neck: no adenopathy, no carotid bruit, no JVD, supple, symmetrical, trachea midline and thyroid not enlarged, symmetric, no tenderness/mass/nodules Back: symmetric, no curvature. ROM normal. No CVA tenderness. Lungs: clear to auscultation bilaterally Breasts: normal appearance, no  masses or tenderness, Inspection negative, No nipple retraction or dimpling, No nipple discharge or bleeding, No axillary or supraclavicular adenopathy, Normal to palpation without dominant masses Heart: regular rate and rhythm, S1, S2 normal, no murmur, click, rub or gallop Abdomen: soft, non-tender; bowel sounds normal; no masses,  no  organomegaly Extremities: extremities normal, atraumatic, no cyanosis or edema Skin: Skin color, texture, turgor normal. No rashes or lesions Climbing a flight of stairs? No Preparing food and eating?: No Bathing or showering? No Getting dressed: No Getting to the toilet? No Using the toilet:No Moving around from place to place: No In the past year have you fallen or had a near fall?:No   Are you sexually active?  No  Do you have more than one partner?  No  Hearing Difficulties: No Do you often ask people to speak up or repeat themselves? No Do you experience ringing or noises in your ears? No Do you have difficulty understanding soft or whispered voices? No   Do you feel that you have a problem with memory? No  Do you often misplace items? No  Do you feel safe at home?  Yes  Cognitive Testing  Alert? Yes  Normal Appearance?Yes  Oriented to person? Yes  Place? Yes   Time? Yes  Recall of three objects?  Yes  Can perform simple calculations? Yes  Displays appropriate judgment?Yes  Can read the correct time from a watch face?Yes   Advanced Directives have been discussed with the patient? Yes  List the Names of Other Physician/Practitioners you currently use: 1.    Indicate any recent Medical Services you may have received from other than [facility] providers in the past year (date may be approximate).  Immunization History  Administered Date(s) Administered  . Influenza Split 06/25/2017  . Influenza, High Dose Seasonal PF 06/13/2016, 05/09/2018  . Influenza-Unspecified 07/10/2013, 06/05/2015, 06/13/2016  . Pneumococcal Conjugate-13 03/01/2016  . Pneumococcal Polysaccharide-23 03/20/2018  . Zoster Recombinat (Shingrix) 05/09/2018    Screening Tests Health Maintenance  Topic Date Due  . TETANUS/TDAP  01/03/1955  . INFLUENZA VACCINE  Completed  . DEXA SCAN  Completed  . PNA vac Low Risk Adult  Completed    All answers were reviewed with the patient and necessary  referrals were made:  Elby Beck, FNP   07/01/2018   History reviewed: allergies, current medications, past family history, past medical history, past social history, past surgical history and problem list  Review of Systems A comprehensive review of systems was negative.    Objective:     Vision by Snellen chart: right eye:20/20, left eye:20/20  Body mass index is 34.67 kg/m. Ht 5' 0.05" (1.525 m)   Wt 177 lb 12.8 oz (80.6 kg)   BMI 34.67 kg/m   BP 140/70 (BP Location: Right Arm, Patient Position: Sitting)   Pulse 68   Ht 5' 0.05" (1.525 m)   Wt 177 lb 12.8 oz (80.6 kg)   SpO2 98%   BMI 34.67 kg/m   General Appearance:    Alert, cooperative, no distress, appears stated age  Head:    Normocephalic, without obvious abnormality, atraumatic  Eyes:    PERRL, conjunctiva/corneas clear, EOM's intact, fundi    benign, both eyes  Ears:    Normal TM's and external ear canals, both ears  Nose:   Nares normal, septum midline, mucosa normal, no drainage    or sinus tenderness  Throat:   Lips, mucosa, and tongue normal; teeth and gums normal  Neck:  Supple, symmetrical, trachea midline, no adenopathy;    thyroid:  no enlargement/tenderness/nodules; no carotid   bruit or JVD  Back:     Symmetric, no curvature, ROM normal, no CVA tenderness  Lungs:     Clear to auscultation bilaterally, respirations unlabored  Chest Wall:    No tenderness or deformity   Heart:    Regular rate and rhythm, S1 and S2 normal, no murmur, rub   or gallop  Breast Exam:    No tenderness, masses, or nipple abnormality  Abdomen:     Soft, non-tender, bowel sounds active all four quadrants,    no masses, no organomegaly  Genitalia:    Normal female without lesion, discharge or tenderness  Rectal:    Normal tone, normal prostate, no masses or tenderness;   guaiac negative stool  Extremities:   Extremities normal, atraumatic, no cyanosis or edema  Pulses:   2+ and symmetric all extremities  Skin:    Skin color, texture, turgor normal, no rashes or lesions  Lymph nodes:   Cervical, supraclavicular, and axillary nodes normal  Neurologic:   CNII-XII intact, normal strength, sensation and reflexes    throughout       Assessment:     1. Encounter for Medicare annual wellness exam - discussed labs and follow up in 6 months, labs in 3  2. Obesity (BMI 35.0-39.9 without comorbidity) - discussed increasing walking, changing whole milk to 2%  3. Age-related osteoporosis without current pathological fracture - patient instructed on Vit D supplementation and when normal, will restart her alendronate - VITAMIN D 25 Hydroxy (Vit-D Deficiency, Fractures); Future  4. Vitamin D deficiency - VITAMIN D 25 Hydroxy (Vit-D Deficiency, Fractures); Future  5. Elevated serum creatinine - Basic Metabolic Panel; Future - Microalbumin / creatinine urine ratio; Future   Clarene Reamer, FNP-BC  [facility] [place] [facility]  07/03/2018 8:46 AM      Plan:     During the course of the visit the patient was educated and counseled about appropriate screening and preventive services including:    see below  Diet review for nutrition referral? Yes ____  Not Indicated ___X_   Patient Instructions (the written plan) was given to the patient.  Medicare Attestation I have personally reviewed: The patient's medical and social history Their use of alcohol, tobacco or illicit drugs Their current medications and supplements The patient's functional ability including ADLs,fall risks, home safety risks, cognitive, and hearing and visual impairment Diet and physical activities Evidence for depression or mood disorders  The patient's weight, height, BMI, and visual acuity have been recorded in the chart.  I have made referrals, counseling, and provided education to the patient based on review of the above and I have provided the patient with a written personalized  care plan for preventive services.     Elby Beck, FNP   07/01/2018

## 2018-07-01 NOTE — Patient Instructions (Addendum)
Take vitamin D3- two tablets daily  Schedule a lab only visit for 3 month and a follow up in 6 months   Katherine Brooks , Thank you for taking time to come for your Medicare Wellness Visit. I appreciate your ongoing commitment to your health goals. Please review the following plan we discussed and let me know if I can assist you in the future.   These are the goals we discussed: Goals - walk 15 minutes 5 times a week, go to 2% mild       This is a list of the screening recommended for you and due dates:  Health Maintenance  Topic Date Due  . Tetanus Vaccine  01/03/1955  . Flu Shot  Completed  . DEXA scan (bone density measurement)  Completed  . Pneumonia vaccines  Completed

## 2018-07-03 ENCOUNTER — Encounter: Payer: Self-pay | Admitting: Family Medicine

## 2018-07-16 ENCOUNTER — Encounter: Payer: Self-pay | Admitting: Family Medicine

## 2018-10-01 ENCOUNTER — Other Ambulatory Visit (INDEPENDENT_AMBULATORY_CARE_PROVIDER_SITE_OTHER): Payer: Medicare Other

## 2018-10-01 DIAGNOSIS — M810 Age-related osteoporosis without current pathological fracture: Secondary | ICD-10-CM

## 2018-10-01 DIAGNOSIS — R7989 Other specified abnormal findings of blood chemistry: Secondary | ICD-10-CM

## 2018-10-01 DIAGNOSIS — E559 Vitamin D deficiency, unspecified: Secondary | ICD-10-CM | POA: Diagnosis not present

## 2018-10-01 LAB — BASIC METABOLIC PANEL
BUN: 21 mg/dL (ref 6–23)
CALCIUM: 9.5 mg/dL (ref 8.4–10.5)
CO2: 25 mEq/L (ref 19–32)
CREATININE: 1.45 mg/dL — AB (ref 0.40–1.20)
Chloride: 106 mEq/L (ref 96–112)
GFR: 34.68 mL/min — AB (ref >60.00)
GLUCOSE: 89 mg/dL (ref 70–99)
Potassium: 4.5 mEq/L (ref 3.5–5.1)
Sodium: 139 mEq/L (ref 135–145)

## 2018-10-01 LAB — MICROALBUMIN / CREATININE URINE RATIO
CREATININE, U: 125.4 mg/dL
MICROALB UR: 3.9 mg/dL — AB (ref 0.0–1.9)
MICROALB/CREAT RATIO: 3.1 mg/g (ref 0.0–30.0)

## 2018-10-01 LAB — VITAMIN D 25 HYDROXY (VIT D DEFICIENCY, FRACTURES): VITD: 48.22 ng/mL (ref 30.00–100.00)

## 2018-10-22 ENCOUNTER — Encounter: Payer: Self-pay | Admitting: Family Medicine

## 2018-10-26 ENCOUNTER — Other Ambulatory Visit: Payer: Self-pay | Admitting: Family Medicine

## 2018-10-26 DIAGNOSIS — R7989 Other specified abnormal findings of blood chemistry: Secondary | ICD-10-CM

## 2018-11-10 ENCOUNTER — Encounter: Payer: Self-pay | Admitting: Family Medicine

## 2018-11-10 ENCOUNTER — Other Ambulatory Visit (INDEPENDENT_AMBULATORY_CARE_PROVIDER_SITE_OTHER): Payer: Medicare Other

## 2018-11-10 DIAGNOSIS — R7989 Other specified abnormal findings of blood chemistry: Secondary | ICD-10-CM | POA: Diagnosis not present

## 2018-11-10 DIAGNOSIS — Z113 Encounter for screening for infections with a predominantly sexual mode of transmission: Secondary | ICD-10-CM

## 2018-11-10 LAB — BASIC METABOLIC PANEL
BUN: 19 mg/dL (ref 6–23)
CHLORIDE: 104 meq/L (ref 96–112)
CO2: 26 meq/L (ref 19–32)
Calcium: 9.8 mg/dL (ref 8.4–10.5)
Creatinine, Ser: 1.24 mg/dL — ABNORMAL HIGH (ref 0.40–1.20)
GFR: 41.53 mL/min — ABNORMAL LOW (ref >60.00)
GLUCOSE: 91 mg/dL (ref 70–99)
POTASSIUM: 4.2 meq/L (ref 3.5–5.1)
SODIUM: 138 meq/L (ref 135–145)

## 2018-12-24 NOTE — Telephone Encounter (Signed)
I spoke with daughter Mardene Celeste. We set up labs for Tues 4/21. She is requesting vit D labs if you are agreeable.

## 2018-12-29 ENCOUNTER — Other Ambulatory Visit: Payer: Medicare Other

## 2019-01-01 ENCOUNTER — Encounter: Payer: Self-pay | Admitting: Family Medicine

## 2019-01-01 ENCOUNTER — Ambulatory Visit (INDEPENDENT_AMBULATORY_CARE_PROVIDER_SITE_OTHER): Payer: Medicare Other | Admitting: Family Medicine

## 2019-01-01 ENCOUNTER — Other Ambulatory Visit: Payer: Self-pay

## 2019-01-01 VITALS — Ht 60.05 in

## 2019-01-01 DIAGNOSIS — I10 Essential (primary) hypertension: Secondary | ICD-10-CM | POA: Diagnosis not present

## 2019-01-01 DIAGNOSIS — M810 Age-related osteoporosis without current pathological fracture: Secondary | ICD-10-CM | POA: Diagnosis not present

## 2019-01-01 DIAGNOSIS — E669 Obesity, unspecified: Secondary | ICD-10-CM

## 2019-01-01 MED ORDER — AMLODIPINE BESYLATE 5 MG PO TABS: 5.0000 mg | ORAL_TABLET | Freq: Every day | ORAL | 3 refills | Status: DC

## 2019-01-01 MED ORDER — ALENDRONATE SODIUM 70 MG PO TABS: 70.0000 mg | ORAL_TABLET | ORAL | 3 refills | Status: DC

## 2019-01-01 NOTE — Progress Notes (Signed)
Virtual Visit via Video Note  I connected with Katherine Brooks on 01/01/19 at  9:37 AM EDT by a video enabled telemedicine application and verified that I am speaking with the correct person using two identifiers. The patient was at her home and I was at my office.    I discussed the limitations of evaluation and management by telemedicine and the availability of in person appointments. The patient expressed understanding and agreed to proceed.  History of Present Illness: This is an 83 year old female who agrees to virtual visit today for follow-up of chronic medical conditions.  Osteoporosis- she had very low vitamin D level and we have been replacing this in order to start bisphosphonates.  Follow-up vitamin D level was 48.  She was previously on alendronate for several years which she tolerated well.  When she ran out of her prescription she was not having regular primary care at that time so came off of it.  She had a DEXA scan 03/26/2018 which showed osteoporosis.  Essential hypertension-she continues to take amlodipine and denies any side effects.  She does not have a like to check her blood pressure at home.  Obesity- she would like to lose 10 pounds and is frustrated because she feels that she is unable to get the weight off.  She asked about the possibility of insulin resistance.  I reviewed her labs with her and normal blood sugars suggest that insulin resistance is not the problem.  Reviewed her diet with her.  She eats mostly carbohydrates including white bread, potatoes.  She eats sandwiches frequently for breakfast and lunch, frequent processed meats such as hot dogs, bacon and sausage.  She does not like most meats.  She does not eat fish.  She does grow a lot of vegetables.  Good water intake and she has stopped drinking sodas.  She does like knots.  ROS- she denies chest pain, shortness of breath, lower extremity edema, fever, cough  Past Medical History:  Diagnosis Date  . Allergy    . Cancer ([place])    skin  . Hypertension    Past Surgical History:  Procedure Laterality Date  . salivary gland N/A 12/2012   Family History  Problem Relation Age of Onset  . Heart disease Mother    Social History   Tobacco Use  . Smoking status: Never Smoker  . Smokeless tobacco: Never Used  Substance Use Topics  . Alcohol use: No  . Drug use: No      Observations/Objective: The patient is alert and answers questions appropriately.  Visible skin is unremarkable.  She is normally conversive without shortness of breath.  Mood and affect are appropriate. Ht 5' 0.05" (1.525 m)   BMI 34.67 kg/m  Wt Readings from Last 3 Encounters:  07/01/18 177 lb 12.8 oz (80.6 kg)  03/20/18 183 lb 8 oz (83.2 kg)  03/17/18 189 lb 13.1 oz (86.1 kg)   BP Readings from Last 3 Encounters:  07/01/18 140/70  03/20/18 136/72  03/17/18 (!) 142/72    Assessment and Plan: 1. Essential hypertension -Follow-up in 6 months for complete physical - amLODipine (NORVASC) 5 MG tablet; Take 1 tablet (5 mg total) by mouth daily.  Dispense: 90 tablet; Refill: 3  2. Age-related osteoporosis without current pathological fracture -Reviewed instructions for taking and potential side effects - alendronate (FOSAMAX) 70 MG tablet; Take 1 tablet (70 mg total) by mouth once a week. Take with a full glass of water on an empty stomach.  Dispense:  12 tablet; Refill: 3  3. Obesity (BMI 35.0-39.9 without comorbidity) -Encouraged her to eat fewer processed carbohydrates and increase her protein and vegetable intake.  Sent recommendations via my chart as well.   Clarene Reamer, FNP-BC  [facility] [place] [facility]  01/01/2019 10:17 AM   Follow Up Instructions: Recap of visit sent to patient via MyChart   I discussed the assessment and treatment plan with the patient. The patient was provided an opportunity to ask questions and all were answered. The patient agreed with the plan  and demonstrated an understanding of the instructions.   The patient was advised to call back or seek an in-person evaluation if the symptoms worsen or if the condition fails to improve as anticipated.  I provided virtual visit today for 36-month follow-up of chronic medical conditions.  Minutes of non-face-to-face time during this encounter.   Elby Beck, FNP

## 2019-07-02 ENCOUNTER — Other Ambulatory Visit: Payer: Self-pay | Admitting: Family Medicine

## 2019-07-02 ENCOUNTER — Encounter: Payer: Self-pay | Admitting: Family Medicine

## 2019-07-02 DIAGNOSIS — M810 Age-related osteoporosis without current pathological fracture: Secondary | ICD-10-CM

## 2019-07-02 DIAGNOSIS — N289 Disorder of kidney and ureter, unspecified: Secondary | ICD-10-CM

## 2019-07-05 ENCOUNTER — Ambulatory Visit: Payer: Medicare Other

## 2019-07-05 ENCOUNTER — Other Ambulatory Visit (INDEPENDENT_AMBULATORY_CARE_PROVIDER_SITE_OTHER): Payer: Medicare Other

## 2019-07-05 ENCOUNTER — Ambulatory Visit (INDEPENDENT_AMBULATORY_CARE_PROVIDER_SITE_OTHER): Payer: Medicare Other

## 2019-07-05 DIAGNOSIS — M810 Age-related osteoporosis without current pathological fracture: Secondary | ICD-10-CM | POA: Diagnosis not present

## 2019-07-05 DIAGNOSIS — Z0000 Encounter for general adult medical examination without abnormal findings: Secondary | ICD-10-CM | POA: Diagnosis not present

## 2019-07-05 DIAGNOSIS — N289 Disorder of kidney and ureter, unspecified: Secondary | ICD-10-CM | POA: Diagnosis not present

## 2019-07-05 LAB — COMPREHENSIVE METABOLIC PANEL
ALT: 9 U/L (ref 0–35)
AST: 15 U/L (ref 0–37)
Albumin: 4.1 g/dL (ref 3.5–5.2)
Alkaline Phosphatase: 65 U/L (ref 39–117)
BUN: 20 mg/dL (ref 6–23)
CO2: 27 mEq/L (ref 19–32)
Calcium: 9.5 mg/dL (ref 8.4–10.5)
Chloride: 103 mEq/L (ref 96–112)
Creatinine, Ser: 1.3 mg/dL — ABNORMAL HIGH (ref 0.40–1.20)
GFR: 39.26 mL/min — ABNORMAL LOW (ref >60.00)
Glucose, Bld: 94 mg/dL (ref 70–99)
Potassium: 4.3 mEq/L (ref 3.5–5.1)
Sodium: 137 mEq/L (ref 135–145)
Total Bilirubin: 0.4 mg/dL (ref 0.2–1.2)
Total Protein: 7.1 g/dL (ref 6.0–8.3)

## 2019-07-05 NOTE — Patient Instructions (Signed)
Katherine Brooks , Thank you for taking time to come for your Medicare Wellness Visit. I appreciate your ongoing commitment to your health goals. Please review the following plan we discussed and let me know if I can assist you in the future.   Screening recommendations/referrals: Colonoscopy: no longer required Mammogram: no longer required Bone Density: up to date, completed 03/26/2018 Recommended yearly ophthalmology/optometry visit for glaucoma screening and checkup Recommended yearly dental visit for hygiene and checkup  Vaccinations: Influenza vaccine: will get at next office visit Pneumococcal vaccine: Completed series Tdap vaccine: declined Shingles vaccine: Completed series    Advanced directives: Please bring a copy of your POA (Power of Attorney) and/or Living Will to your next appointment.   Conditions/risks identified: hypertension  Next appointment: 07/07/2019 @ 10:30 am    Preventive Care 65 Years and Older, Female Preventive care refers to lifestyle choices and visits with your health care provider that can promote health and wellness. What does preventive care include?  A yearly physical exam. This is also called an annual well check.  Dental exams once or twice a year.  Routine eye exams. Ask your health care provider how often you should have your eyes checked.  Personal lifestyle choices, including:  Daily care of your teeth and gums.  Regular physical activity.  Eating a healthy diet.  Avoiding tobacco and drug use.  Limiting alcohol use.  Practicing safe sex.  Taking low-dose aspirin every day.  Taking vitamin and mineral supplements as recommended by your health care provider. What happens during an annual well check? The services and screenings done by your health care provider during your annual well check will depend on your age, overall health, lifestyle risk factors, and family history of disease. Counseling  Your health care provider may ask  you questions about your:  Alcohol use.  Tobacco use.  Drug use.  Emotional well-being.  Home and relationship well-being.  Sexual activity.  Eating habits.  History of falls.  Memory and ability to understand (cognition).  Work and work Statistician.  Reproductive health. Screening  You may have the following tests or measurements:  Height, weight, and BMI.  Blood pressure.  Lipid and cholesterol levels. These may be checked every 5 years, or more frequently if you are over 27 years old.  Skin check.  Lung cancer screening. You may have this screening every year starting at age 108 if you have a 30-pack-year history of smoking and currently smoke or have quit within the past 15 years.  Fecal occult blood test (FOBT) of the stool. You may have this test every year starting at age 45.  Flexible sigmoidoscopy or colonoscopy. You may have a sigmoidoscopy every 5 years or a colonoscopy every 10 years starting at age 78.  Hepatitis C blood test.  Hepatitis B blood test.  Sexually transmitted disease (STD) testing.  Diabetes screening. This is done by checking your blood sugar (glucose) after you have not eaten for a while (fasting). You may have this done every 1-3 years.  Bone density scan. This is done to screen for osteoporosis. You may have this done starting at age 70.  Mammogram. This may be done every 1-2 years. Talk to your health care provider about how often you should have regular mammograms. Talk with your health care provider about your test results, treatment options, and if necessary, the need for more tests. Vaccines  Your health care provider may recommend certain vaccines, such as:  Influenza vaccine. This is recommended every  year.  Tetanus, diphtheria, and acellular pertussis (Tdap, Td) vaccine. You may need a Td booster every 10 years.  Zoster vaccine. You may need this after age 37.  Pneumococcal 13-valent conjugate (PCV13) vaccine. One dose  is recommended after age 67.  Pneumococcal polysaccharide (PPSV23) vaccine. One dose is recommended after age 71. Talk to your health care provider about which screenings and vaccines you need and how often you need them. This information is not intended to replace advice given to you by your health care provider. Make sure you discuss any questions you have with your health care provider. Document Released: 09/22/2015 Document Revised: 05/15/2016 Document Reviewed: 06/27/2015 Elsevier Interactive Patient Education  2017 [employer] Prevention in the Home Falls can cause injuries. They can happen to people of all ages. There are many things you can do to make your home safe and to help prevent falls. What can I do on the outside of my home?  Regularly fix the edges of walkways and driveways and fix any cracks.  Remove anything that might make you trip as you walk through a door, such as a raised step or threshold.  Trim any bushes or trees on the path to your home.  Use bright outdoor lighting.  Clear any walking paths of anything that might make someone trip, such as rocks or tools.  Regularly check to see if handrails are loose or broken. Make sure that both sides of any steps have handrails.  Any raised decks and porches should have guardrails on the edges.  Have any leaves, snow, or ice cleared regularly.  Use sand or salt on walking paths during winter.  Clean up any spills in your garage right away. This includes oil or grease spills. What can I do in the bathroom?  Use night lights.  Install grab bars by the toilet and in the tub and shower. Do not use towel bars as grab bars.  Use non-skid mats or decals in the tub or shower.  If you need to sit down in the shower, use a plastic, non-slip stool.  Keep the floor dry. Clean up any water that spills on the floor as soon as it happens.  Remove soap buildup in the tub or shower regularly.  Attach bath mats  securely with double-sided non-slip rug tape.  Do not have throw rugs and other things on the floor that can make you trip. What can I do in the bedroom?  Use night lights.  Make sure that you have a light by your bed that is easy to reach.  Do not use any sheets or blankets that are too big for your bed. They should not hang down onto the floor.  Have a firm chair that has side arms. You can use this for support while you get dressed.  Do not have throw rugs and other things on the floor that can make you trip. What can I do in the kitchen?  Clean up any spills right away.  Avoid walking on wet floors.  Keep items that you use a lot in easy-to-reach places.  If you need to reach something above you, use a strong step stool that has a grab bar.  Keep electrical cords out of the way.  Do not use floor polish or wax that makes floors slippery. If you must use wax, use non-skid floor wax.  Do not have throw rugs and other things on the floor that can make you trip. What can  I do with my stairs?  Do not leave any items on the stairs.  Make sure that there are handrails on both sides of the stairs and use them. Fix handrails that are broken or loose. Make sure that handrails are as long as the stairways.  Check any carpeting to make sure that it is firmly attached to the stairs. Fix any carpet that is loose or worn.  Avoid having throw rugs at the top or bottom of the stairs. If you do have throw rugs, attach them to the floor with carpet tape.  Make sure that you have a light switch at the top of the stairs and the bottom of the stairs. If you do not have them, ask someone to add them for you. What else can I do to help prevent falls?  Wear shoes that:  Do not have high heels.  Have rubber bottoms.  Are comfortable and fit you well.  Are closed at the toe. Do not wear sandals.  If you use a stepladder:  Make sure that it is fully opened. Do not climb a closed  stepladder.  Make sure that both sides of the stepladder are locked into place.  Ask someone to hold it for you, if possible.  Clearly mark and make sure that you can see:  Any grab bars or handrails.  First and last steps.  Where the edge of each step is.  Use tools that help you move around (mobility aids) if they are needed. These include:  Canes.  Walkers.  Scooters.  Crutches.  Turn on the lights when you go into a dark area. Replace any light bulbs as soon as they burn out.  Set up your furniture so you have a clear path. Avoid moving your furniture around.  If any of your floors are uneven, fix them.  If there are any pets around you, be aware of where they are.  Review your medicines with your doctor. Some medicines can make you feel dizzy. This can increase your chance of falling. Ask your doctor what other things that you can do to help prevent falls. This information is not intended to replace advice given to you by your health care provider. Make sure you discuss any questions you have with your health care provider. Document Released: 06/22/2009 Document Revised: 02/01/2016 Document Reviewed: 09/30/2014 Elsevier Interactive Patient Education  2017 [employer].

## 2019-07-05 NOTE — Progress Notes (Signed)
PCP notes:  Health Maintenance: needs flu vaccine    Abnormal Screenings: none    Patient concerns: none    Nurse concerns: none    Next PCP appt.: 07/07/2019 @ 10:30 am

## 2019-07-05 NOTE — Progress Notes (Signed)
Subjective:   Katherine Brooks is a 83 y.o. female who presents for Medicare Annual (Subsequent) preventive examination.  Review of Systems:    This visit is being conducted through telemedicine via telephone at the nurse health advisor's home address due to the COVID-19 pandemic. This patient has given me verbal consent via doximity to conduct this visit, patient states they are participating from their home address. Patient and myself are on the telephone call. There is no referral for this visit. Some vital signs may be absent or patient reported.    Patient identification: identified by name, DOB, and current address   Cardiac Risk Factors include: advanced age (>86men, >79 women);hypertension     Objective:     Vitals: There were no vitals taken for this visit.  There is no height or weight on file to calculate BMI.  Advanced Directives 07/05/2019 03/16/2018 03/15/2018  Does Patient Have a Medical Advance Directive? Yes Yes No  Type of Paramedic of [place];Living will Living will;Healthcare Power of Attorney -  Does patient want to make changes to medical advance directive? - No - Patient declined -  Copy of [place] in Chart? No - copy requested No - copy requested -  Would patient like information on creating a medical advance directive? - No - Patient declined No - Patient declined    Tobacco Social History   Tobacco Use  Smoking Status Never Smoker  Smokeless Tobacco Never Used     Counseling given: Not Answered   Clinical Intake:  Pre-visit preparation completed: Yes  Pain : No/denies pain     Nutritional Risks: None Diabetes: No  How often do you need to have someone help you when you read instructions, pamphlets, or other written materials from your doctor or pharmacy?: 1 - Never What is the last grade level you completed in school?: some college  Interpreter Needed?: No  Information entered by :: CJohnson, LPN   Past Medical History:  Diagnosis Date  . Allergy   . Cancer ([place])    skin  . Hypertension    Past Surgical History:  Procedure Laterality Date  . salivary gland N/A 12/2012   Family History  Problem Relation Age of Onset  . Heart disease Mother    Social History   Socioeconomic History  . Marital status: Married    Spouse name: Not on file  . Number of children: Not on file  . Years of education: Not on file  . Highest education level: Not on file  Occupational History  . Not on file  Social Needs  . Financial resource strain: Not hard at all  . Food insecurity    Worry: Never true    Inability: Never true  . Transportation needs    Medical: No    Non-medical: No  Tobacco Use  . Smoking status: Never Smoker  . Smokeless tobacco: Never Used  Substance and Sexual Activity  . Alcohol use: No  . Drug use: No  . Sexual activity: Yes  Lifestyle  . Physical activity    Days per week: 0 days    Minutes per session: 0 min  . Stress: Not at all  Relationships  . Social Herbalist on phone: Not on file    Gets together: Not on file    Attends religious service: Not on file    Active member of club or organization: Not on file    Attends meetings of  clubs or organizations: Not on file    Relationship status: Not on file  Other Topics Concern  . Not on file  Social History Narrative  . Not on file    Outpatient Encounter Medications as of 07/05/2019  Medication Sig  . alendronate (FOSAMAX) 70 MG tablet Take 1 tablet (70 mg total) by mouth once a week. Take with a full glass of water on an empty stomach.  Marland Kitchen amLODipine (NORVASC) 5 MG tablet Take 1 tablet (5 mg total) by mouth daily.   No facility-administered encounter medications on file as of 07/05/2019.     Activities of Daily Living In your present state of health, do you have any difficulty performing the following activities: 07/05/2019  Hearing? N  Vision? N  Difficulty concentrating or  making decisions? N  Walking or climbing stairs? N  Dressing or bathing? N  Doing errands, shopping? N  Preparing Food and eating ? N  Using the Toilet? N  In the past six months, have you accidently leaked urine? N  Do you have problems with loss of bowel control? N  Managing your Medications? N  Managing your Finances? N  Housekeeping or managing your Housekeeping? N  Some recent data might be hidden    Patient Care Team: Elby Beck, FNP as PCP - General (Nurse Practitioner)    Assessment:   This is a routine wellness examination for Janette.  Exercise Activities and Dietary recommendations Current Exercise Habits: The patient does not participate in regular exercise at present, Exercise limited by: None identified  Goals    . Patient Stated     07/05/2019, I will try lose some weight in the future.        Fall Risk Fall Risk  07/05/2019 07/01/2018 03/20/2018 03/01/2016  Falls in the past year? 0 No No No  Number falls in past yr: 0 - - -  Injury with Fall? 0 - - -  Risk for fall due to : Medication side effect - - -  Follow up Falls prevention discussed;Falls evaluation completed - - -   Is the patient's home free of loose throw rugs in walkways, pet beds, electrical cords, etc?   yes      Grab bars in the bathroom? yes      Handrails on the stairs?   yes      Adequate lighting?   yes  Timed Get Up and Go performed: N/A  Depression Screen PHQ 2/9 Scores 07/05/2019 07/01/2018 03/20/2018 03/01/2016  PHQ - 2 Score 0 0 0 0  PHQ- 9 Score 0 - - -     Cognitive Function MMSE - Mini Mental State Exam 07/05/2019  Orientation to time 5  Orientation to Place 5  Registration 3  Attention/ Calculation 5  Recall 3  Language- repeat 1       Mini Cog  Mini-Cog screen was completed. Maximum score is 22. A value of 0 denotes this part of the MMSE was not completed or the patient failed this part of the Mini-Cog screening.  Immunization History  Administered  Date(s) Administered  . Influenza Split 06/25/2017  . Influenza, High Dose Seasonal PF 06/13/2016, 05/09/2018  . Influenza-Unspecified 07/10/2013, 06/05/2015, 06/13/2016  . Pneumococcal Conjugate-13 03/01/2016  . Pneumococcal Polysaccharide-23 03/20/2018  . Zoster Recombinat (Shingrix) 05/09/2018, 07/15/2018    Qualifies for Shingles Vaccine? Completed series   Screening Tests Health Maintenance  Topic Date Due  . INFLUENZA VACCINE  04/10/2019  . TETANUS/TDAP  07/04/2020 (Originally 01/03/1955)  .  DEXA SCAN  Completed  . PNA vac Low Risk Adult  Completed    Cancer Screenings: Lung: Low Dose CT Chest recommended if Age 58-80 years, 30 pack-year currently smoking OR have quit w/in 15years. Patient does not qualify. Breast:  Up to date on Mammogram? Yes, no longer required   Up to date of Bone Density/Dexa? Yes, completed 03/26/2018 Colorectal: no longer required  Additional Screenings:  Hepatitis C Screening: N/A     Plan:   Patient will try to lose some weight  In the future.  I have personally reviewed and noted the following in the patient's chart:   . Medical and social history . Use of alcohol, tobacco or illicit drugs  . Current medications and supplements . Functional ability and status . Nutritional status . Physical activity . Advanced directives . List of other physicians . Hospitalizations, surgeries, and ER visits in previous 12 months . Vitals . Screenings to include cognitive, depression, and falls . Referrals and appointments  In addition, I have reviewed and discussed with patient certain preventive protocols, quality metrics, and best practice recommendations. A written personalized care plan for preventive services as well as general preventive health recommendations were provided to patient.     Andrez Grime, LPN  01/41/0301

## 2019-07-07 ENCOUNTER — Encounter: Payer: Self-pay | Admitting: Family Medicine

## 2019-07-07 ENCOUNTER — Ambulatory Visit (INDEPENDENT_AMBULATORY_CARE_PROVIDER_SITE_OTHER): Payer: Medicare Other | Admitting: Family Medicine

## 2019-07-07 ENCOUNTER — Other Ambulatory Visit: Payer: Self-pay

## 2019-07-07 VITALS — BP 138/62 | HR 75 | Temp 98.2°F | Ht 61.0 in | Wt 186.6 lb

## 2019-07-07 DIAGNOSIS — E669 Obesity, unspecified: Secondary | ICD-10-CM | POA: Diagnosis not present

## 2019-07-07 DIAGNOSIS — I10 Essential (primary) hypertension: Secondary | ICD-10-CM

## 2019-07-07 DIAGNOSIS — N289 Disorder of kidney and ureter, unspecified: Secondary | ICD-10-CM

## 2019-07-07 DIAGNOSIS — Z0000 Encounter for general adult medical examination without abnormal findings: Secondary | ICD-10-CM | POA: Diagnosis not present

## 2019-07-07 NOTE — Progress Notes (Signed)
Subjective:    Patient ID: Katherine Brooks, female    DOB: 1936-06-19, 83 y.o.   MRN: [id number]  HPI This is an 83 yo female who presents today for CPE. Has been doing well. Lives with her husband. They have been very busy with gardening and yard work. When weather doesn't allow her to be outside, she catches up on indoor projects. Has not been very affected by pandemic.    Last CPE- annual Mammo- no longer desires screening Tdap- declines, discussed having at her pharmacy [facility]- regular Dental-regular Exercise- very active with gardening, upkeep of house  Elevated creatinine/decreased GFR- stable since 2014.   Past Medical History:  Diagnosis Date  . Allergy   . Cancer ([place])    skin  . Hypertension    Past Surgical History:  Procedure Laterality Date  . salivary gland N/A 12/2012   Family History  Problem Relation Age of Onset  . Heart disease Mother    Social History   Tobacco Use  . Smoking status: Never Smoker  . Smokeless tobacco: Never Used  Substance Use Topics  . Alcohol use: No  . Drug use: No      Review of Systems  Constitutional: Negative.   HENT: Positive for rhinorrhea (occasional, relieved with otc antihistamines).   Eyes: Negative.   Respiratory: Positive for wheezing (2 episodes over the sumer with allergy symptoms, resolved ).   Cardiovascular: Negative.   Gastrointestinal: Negative.   Endocrine: Negative.   Genitourinary: Negative.   Musculoskeletal: Negative.   Skin: Negative.   Allergic/Immunologic: Positive for environmental allergies.  Neurological: Negative.   Hematological: Negative.   Psychiatric/Behavioral: Negative.        Objective:   Physical Exam Vitals signs reviewed.  Constitutional:      General: She is not in acute distress.    Appearance: Normal appearance. She is obese. She is not ill-appearing, toxic-appearing or diaphoretic.  HENT:     Head: Normocephalic and atraumatic.     Right Ear: External ear  normal.     Left Ear: External ear normal.  Eyes:     Conjunctiva/sclera: Conjunctivae normal.  Neck:     Musculoskeletal: Normal range of motion and neck supple.  Cardiovascular:     Rate and Rhythm: Normal rate and regular rhythm.     Heart sounds: Normal heart sounds.  Pulmonary:     Effort: Pulmonary effort is normal.     Breath sounds: Normal breath sounds.  Musculoskeletal:     Right lower leg: No edema.     Left lower leg: No edema.  Skin:    General: Skin is warm and dry.  Neurological:     Mental Status: She is alert and oriented to person, place, and time.  Psychiatric:        Mood and Affect: Mood normal.        Behavior: Behavior normal.        Thought Content: Thought content normal.        Judgment: Judgment normal.       BP 138/62   Pulse 75   Temp 98.2 F (36.8 C)   Ht 5\' 1"  (1.549 m)   Wt 186 lb 9.6 oz (84.6 kg)   SpO2 98%   BMI 35.26 kg/m  Wt Readings from Last 3 Encounters:  07/07/19 186 lb 9.6 oz (84.6 kg)  07/01/18 177 lb 12.8 oz (80.6 kg)  03/20/18 183 lb 8 oz (83.2 kg)  Assessment & Plan:  1. Annual physical exam - Discussed and encouraged healthy lifestyle choices- adequate sleep, regular exercise, stress management and healthy food choices.    2. Obesity (BMI 35.0-39.9 without comorbidity) - discussed nutrition, activity  3. Renal insufficiency, mild - stable since 2014, she was reminded to continue to avoid NSAIDs  4. Essential hypertension - well controlled  - follow up in 1 year Clarene Reamer, FNP-BC  [facility], [place]  07/07/2019 10:43 PM

## 2019-07-07 NOTE — Patient Instructions (Signed)
Health Maintenance After Age 81 After age 81, you are at a higher risk for certain long-term diseases and infections as well as injuries from falls. Falls are a major cause of broken bones and head injuries in people who are older than age 81. Getting regular preventive care can help to keep you healthy and well. Preventive care includes getting regular testing and making lifestyle changes as recommended by your health care provider. Talk with your health care provider about:  Which screenings and tests you should have. A screening is a test that checks for a disease when you have no symptoms.  A diet and exercise plan that is right for you. What should I know about screenings and tests to prevent falls? Screening and testing are the best ways to find a health problem early. Early diagnosis and treatment give you the best chance of managing medical conditions that are common after age 81. Certain conditions and lifestyle choices may make you more likely to have a fall. Your health care provider may recommend:  Regular vision checks. Poor vision and conditions such as cataracts can make you more likely to have a fall. If you wear glasses, make sure to get your prescription updated if your vision changes.  Medicine review. Work with your health care provider to regularly review all of the medicines you are taking, including over-the-counter medicines. Ask your health care provider about any side effects that may make you more likely to have a fall. Tell your health care provider if any medicines that you take make you feel dizzy or sleepy.  Osteoporosis screening. Osteoporosis is a condition that causes the bones to get weaker. This can make the bones weak and cause them to break more easily.  Blood pressure screening. Blood pressure changes and medicines to control blood pressure can make you feel dizzy.  Strength and balance checks. Your health care provider may recommend certain tests to check your  strength and balance while standing, walking, or changing positions.  Foot health exam. Foot pain and numbness, as well as not wearing proper footwear, can make you more likely to have a fall.  Depression screening. You may be more likely to have a fall if you have a fear of falling, feel emotionally low, or feel unable to do activities that you used to do.  Alcohol use screening. Using too much alcohol can affect your balance and may make you more likely to have a fall. What actions can I take to lower my risk of falls? General instructions  Talk with your health care provider about your risks for falling. Tell your health care provider if: ? You fall. Be sure to tell your health care provider about all falls, even ones that seem minor. ? You feel dizzy, sleepy, or off-balance.  Take over-the-counter and prescription medicines only as told by your health care provider. These include any supplements.  Eat a healthy diet and maintain a healthy weight. A healthy diet includes low-fat dairy products, low-fat (lean) meats, and fiber from whole grains, beans, and lots of fruits and vegetables. Home safety  Remove any tripping hazards, such as rugs, cords, and clutter.  Install safety equipment such as grab bars in bathrooms and safety rails on stairs.  Keep rooms and walkways well-lit. Activity   Follow a regular exercise program to stay fit. This will help you maintain your balance. Ask your health care provider what types of exercise are appropriate for you.  If you need a cane or   walker, use it as recommended by your health care provider.  Wear supportive shoes that have nonskid soles. Lifestyle  Do not drink alcohol if your health care provider tells you not to drink.  If you drink alcohol, limit how much you have: ? 0-1 drink a day for women. ? 0-2 drinks a day for men.  Be aware of how much alcohol is in your drink. In the [place] one drink equals one typical bottle of beer (12  oz), one-half glass of wine (5 oz), or one shot of hard liquor (1 oz).  Do not use any products that contain nicotine or tobacco, such as cigarettes and e-cigarettes. If you need help quitting, ask your health care provider. Summary  Having a healthy lifestyle and getting preventive care can help to protect your health and wellness after age 81.  Screening and testing are the best way to find a health problem early and help you avoid having a fall. Early diagnosis and treatment give you the best chance for managing medical conditions that are more common for people who are older than age 81.  Falls are a major cause of broken bones and head injuries in people who are older than age 81. Take precautions to prevent a fall at home.  Work with your health care provider to learn what changes you can make to improve your health and wellness and to prevent falls. This information is not intended to replace advice given to you by your health care provider. Make sure you discuss any questions you have with your health care provider. Document Released: 07/09/2017 Document Revised: 12/17/2018 Document Reviewed: 07/09/2017 Elsevier Patient Education  2020 Elsevier Inc.  

## 2019-07-07 NOTE — Progress Notes (Signed)
Subjective:    Patient ID: Katherine Brooks, female    DOB: 10-17-1935, 83 y.o.   MRN: [id number]  Chief Complaint  Patient presents with  . Annual Exam    annual wellness exam     HPI  Pt had a wheezing episode while she was outside gardening. The last time episode happened about a month ago.  Flu-10/28 Dental- twice a year  Eye-regulare Exercise- working in the garden  Diet- lots of carbohydrates  Past Medical History:  Diagnosis Date  . Allergy   . Cancer ([place])    skin  . Hypertension    Past Surgical History:  Procedure Laterality Date  . salivary gland N/A 12/2012   Family History  Problem Relation Age of Onset  . Heart disease Mother     Review of Systems  Constitutional: Negative for activity change, fatigue and fever.  HENT: Negative for congestion, ear pain, mouth sores, rhinorrhea, sinus pressure, sinus pain and sore throat.   Eyes: Negative for pain, discharge, itching and visual disturbance.  Respiratory: Negative for cough, chest tightness, shortness of breath, wheezing and stridor.   Cardiovascular: Negative for palpitations and leg swelling.  Gastrointestinal: Negative for abdominal distention, abdominal pain, blood in stool, constipation, diarrhea, nausea and vomiting.  Genitourinary: Negative for difficulty urinating and hematuria.  Musculoskeletal: Negative for gait problem.  Skin: Negative for color change, pallor, rash and wound.  Allergic/Immunologic: Positive for environmental allergies.  Neurological: Negative for dizziness, facial asymmetry, weakness and light-headedness.  Psychiatric/Behavioral: Negative for confusion.       Objective:   Physical Exam Constitutional:      General: She is not in acute distress.    Appearance: She is normal weight.  HENT:     Head: Normocephalic and atraumatic.     Right Ear: Tympanic membrane, ear canal and external ear normal. There is no impacted cerumen.     Left Ear: Tympanic membrane, ear canal and  external ear normal. There is no impacted cerumen.     Mouth/Throat:     Pharynx: Oropharynx is clear. No oropharyngeal exudate or posterior oropharyngeal erythema.  Eyes:     General: No scleral icterus.       Right eye: No discharge.        Left eye: No discharge.     Extraocular Movements: Extraocular movements intact.     Conjunctiva/sclera: Conjunctivae normal.     Pupils: Pupils are equal, round, and reactive to light.  Neck:     Musculoskeletal: No neck rigidity or muscular tenderness.  Cardiovascular:     Rate and Rhythm: Normal rate and regular rhythm.     Pulses: Normal pulses.     Heart sounds: Normal heart sounds. No murmur. No friction rub. No gallop.   Pulmonary:     Effort: Pulmonary effort is normal. No respiratory distress.     Breath sounds: Normal breath sounds. No rhonchi or rales.  Abdominal:     General: Abdomen is flat. Bowel sounds are normal. There is no distension.     Tenderness: There is no abdominal tenderness.  Musculoskeletal: Normal range of motion.     Right lower leg: No edema.     Left lower leg: No edema.  Skin:    General: Skin is warm and dry.     Findings: No rash.  Neurological:     Mental Status: She is alert and oriented to person, place, and time. Mental status is at baseline.     Motor: No  weakness.  Psychiatric:        Mood and Affect: Mood normal.        Behavior: Behavior normal.        Thought Content: Thought content normal.        Judgment: Judgment normal.       Assessment & Plan:

## 2019-09-17 ENCOUNTER — Encounter: Payer: Self-pay | Admitting: Family Medicine

## 2019-09-17 DIAGNOSIS — I10 Essential (primary) hypertension: Secondary | ICD-10-CM

## 2019-09-17 DIAGNOSIS — M810 Age-related osteoporosis without current pathological fracture: Secondary | ICD-10-CM

## 2019-09-21 ENCOUNTER — Ambulatory Visit: Payer: Medicare Other | Attending: Internal Medicine

## 2019-09-21 DIAGNOSIS — Z23 Encounter for immunization: Secondary | ICD-10-CM

## 2019-09-21 MED ORDER — AMLODIPINE BESYLATE 5 MG PO TABS: 5.0000 mg | ORAL_TABLET | Freq: Every day | ORAL | 2 refills | Status: DC

## 2019-09-21 MED ORDER — ALENDRONATE SODIUM 70 MG PO TABS: 70.0000 mg | ORAL_TABLET | ORAL | 2 refills | Status: DC

## 2019-09-21 NOTE — Progress Notes (Signed)
   Covid-19 Vaccination Clinic  Name:  Katherine Brooks    MRN: [id number] DOB: 1936-03-20  09/21/2019  [name] was observed post Covid-19 immunization for 15 minutes without incidence. She was provided with Vaccine Information Sheet and instruction to access the V-Safe system.   [name] was instructed to call 911 with any severe reactions post vaccine: Marland Kitchen Difficulty breathing  . Swelling of your face and throat  . A fast heartbeat  . A bad rash all over your body  . Dizziness and weakness    Immunizations Administered    Name Date Dose VIS Date Route   Pfizer COVID-19 Vaccine 09/21/2019  9:50 AM 0.3 mL 08/20/2019 Intramuscular   Manufacturer: [employer]   Lot: [id number]   [place]: [id number]

## 2019-10-01 ENCOUNTER — Other Ambulatory Visit: Payer: Self-pay | Admitting: Family Medicine

## 2019-10-01 DIAGNOSIS — M810 Age-related osteoporosis without current pathological fracture: Secondary | ICD-10-CM

## 2019-10-11 ENCOUNTER — Ambulatory Visit: Payer: Medicare PPO | Attending: Internal Medicine

## 2019-10-11 DIAGNOSIS — Z23 Encounter for immunization: Secondary | ICD-10-CM

## 2019-10-11 NOTE — Progress Notes (Signed)
   Covid-19 Vaccination Clinic  Name:  CHRYSA RAMPY    MRN: [id number] DOB: Jan 31, 1936  10/11/2019  Ms. Muma was observed post Covid-19 immunization for 15 minutes without incidence. She was provided with Vaccine Information Sheet and instruction to access the V-Safe system.   Ms. Emrich was instructed to call 911 with any severe reactions post vaccine: Marland Kitchen Difficulty breathing  . Swelling of your face and throat  . A fast heartbeat  . A bad rash all over your body  . Dizziness and weakness    Immunizations Administered    Name Date Dose VIS Date Route   Pfizer COVID-19 Vaccine 10/11/2019  9:25 AM 0.3 mL 08/20/2019 Intramuscular   Manufacturer: [place]   Lot: [id number]   [place]: [id number]

## 2020-03-27 ENCOUNTER — Ambulatory Visit: Payer: Medicare PPO | Admitting: Family Medicine

## 2020-03-27 ENCOUNTER — Encounter: Payer: Self-pay | Admitting: Family Medicine

## 2020-03-27 ENCOUNTER — Other Ambulatory Visit: Payer: Self-pay

## 2020-03-27 VITALS — BP 118/64 | HR 70 | Temp 97.6°F | Ht 61.0 in | Wt 186.0 lb

## 2020-03-27 DIAGNOSIS — R7982 Elevated C-reactive protein (CRP): Secondary | ICD-10-CM

## 2020-03-27 DIAGNOSIS — R519 Headache, unspecified: Secondary | ICD-10-CM

## 2020-03-27 MED ORDER — GABAPENTIN 100 MG PO CAPS: 100.0000 mg | ORAL_CAPSULE | Freq: Three times a day (TID) | ORAL | 0 refills | Status: DC

## 2020-03-27 NOTE — Patient Instructions (Signed)
Good to see you  Please go to the lab, I am checking for blood counts, inflammation  Will notify you of results in am and plan

## 2020-03-27 NOTE — Progress Notes (Signed)
   Subjective:    Patient ID: Katherine Brooks, female    DOB: 07/22/1936, 84 y.o.   MRN: [id number]  HPI Chief Complaint  Patient presents with  . Left Side Facial Pain    Started around 03-23-20 with pain around the cheekbone. Very tender to touch. No injury. No fever. No know insect bites. No mouth pain. Not able to breathe through her nose easity.  This is an 84 yo female, accompanied by her daughter, who presents today for above cc.  She was in her usual state of health, feeling well with normal activities, when she had a sudden onset of pain of her left cheek near her nose. Pain with any pressure to area. Has become more sensitive over last several days. No rash, fever, myalgias. No headache, no ear pain, no pain inside of mouth, no scalp pain. No tic/ spasm.    Took 1 benadryl and 1 alleve last night without relief.   Review of Systems Per HPI    Objective:   Physical Exam Vitals reviewed.  Constitutional:      General: She is not in acute distress.    Appearance: Normal appearance. She is obese. She is not ill-appearing, toxic-appearing or diaphoretic.  HENT:     Head: Normocephalic and atraumatic.     Comments: ? Mild swelling over distal end left cheek bone. Tender to palpation. No erythema, rash. No facial asymmetry.     Right Ear: Tympanic membrane, ear canal and external ear normal.     Left Ear: Tympanic membrane, ear canal and external ear normal.     Nose: Nose normal.     Comments: Bilateral nares patent.     Mouth/Throat:     Mouth: Mucous membranes are moist.     Pharynx: Oropharynx is clear.  Eyes:     Conjunctiva/sclera: Conjunctivae normal.     Comments: Slight tearing bilaterally.   Pulmonary:     Effort: Pulmonary effort is normal.  Skin:    General: Skin is warm and dry.  Neurological:     Mental Status: She is alert and oriented to person, place, and time.       BP 118/64 (BP Location: Left Arm, Patient Position: Sitting, Cuff Size: Large)   Pulse  70   Temp 97.6 F (36.4 C)   Ht 5\' 1"  (1.549 m)   Wt 186 lb (84.4 kg)   SpO2 98%   BMI 35.14 kg/m  Wt Readings from Last 3 Encounters:  03/27/20 186 lb (84.4 kg)  07/07/19 186 lb 9.6 oz (84.6 kg)  07/01/18 177 lb 12.8 oz (80.6 kg)       Assessment & Plan:  1. Facial pain - discussed with Dr. Damita Dunnings - unknown cause, will check labs, consider MRI/MRA to work up possible trigeminal neuralgia, will notify patient of labs tomorrow and further instructions - CBC with Differential - Basic metabolic panel - High sensitivity CRP - Sedimentation rate  This visit occurred during the SARS-CoV-2 public health emergency.  Safety protocols were in place, including screening questions prior to the visit, additional usage of staff PPE, and extensive cleaning of exam room while observing appropriate contact time as indicated for disinfecting solutions.    Clarene Reamer, FNP-BC  [facility]  03/27/2020 2:50 PM

## 2020-03-28 LAB — CBC WITH DIFFERENTIAL/PLATELET
Basophils Absolute: 0.1 10*3/uL (ref 0.0–0.1)
Basophils Relative: 1.1 % (ref 0.0–3.0)
Eosinophils Absolute: 0.3 10*3/uL (ref 0.0–0.7)
Eosinophils Relative: 3.2 % (ref 0.0–5.0)
HCT: 39.4 % (ref 36.0–46.0)
Hemoglobin: 13 g/dL (ref 12.0–15.0)
Lymphocytes Relative: 30.4 % (ref 12.0–46.0)
Lymphs Abs: 3.2 10*3/uL (ref 0.7–4.0)
MCHC: 33.1 g/dL (ref 30.0–36.0)
MCV: 86.3 fl (ref 78.0–100.0)
Monocytes Absolute: 0.8 10*3/uL (ref 0.1–1.0)
Monocytes Relative: 8.1 % (ref 3.0–12.0)
Neutro Abs: 6 10*3/uL (ref 1.4–7.7)
Neutrophils Relative %: 57.2 % (ref 43.0–77.0)
Platelets: 259 10*3/uL (ref 150.0–400.0)
RBC: 4.57 Mil/uL (ref 3.87–5.11)
RDW: 14.7 % (ref 11.5–15.5)
WBC: 10.4 10*3/uL (ref 4.0–10.5)

## 2020-03-28 LAB — HIGH SENSITIVITY CRP: CRP, High Sensitivity: 12.43 mg/L — ABNORMAL HIGH (ref 0.000–5.000)

## 2020-03-28 LAB — BASIC METABOLIC PANEL
BUN: 22 mg/dL (ref 6–23)
CO2: 25 mEq/L (ref 19–32)
Calcium: 9.6 mg/dL (ref 8.4–10.5)
Chloride: 104 mEq/L (ref 96–112)
Creatinine, Ser: 1.41 mg/dL — ABNORMAL HIGH (ref 0.40–1.20)
GFR: 35.69 mL/min — ABNORMAL LOW (ref >60.00)
Glucose, Bld: 82 mg/dL (ref 70–99)
Potassium: 5.2 mEq/L — ABNORMAL HIGH (ref 3.5–5.1)
Sodium: 138 mEq/L (ref 135–145)

## 2020-03-28 LAB — SEDIMENTATION RATE: Sed Rate: 24 mm/hr (ref 0–30)

## 2020-03-28 NOTE — Addendum Note (Signed)
Addended by: Clarene Reamer B on: 03/28/2020 03:34 PM   Modules accepted: Orders

## 2020-03-29 ENCOUNTER — Other Ambulatory Visit: Payer: Self-pay | Admitting: Family Medicine

## 2020-03-29 ENCOUNTER — Encounter: Payer: Self-pay | Admitting: Family Medicine

## 2020-03-29 DIAGNOSIS — N289 Disorder of kidney and ureter, unspecified: Secondary | ICD-10-CM

## 2020-03-29 DIAGNOSIS — G500 Trigeminal neuralgia: Secondary | ICD-10-CM

## 2020-03-29 NOTE — Progress Notes (Signed)
Called and spoke with Dr. Carlis Abbott in radiology regarding appropriate test for patient with trigeminal neuralgia and decreased GFR. Reviewed test to order and his opinion that patient ok to have contrast.

## 2020-03-30 ENCOUNTER — Other Ambulatory Visit: Payer: Self-pay

## 2020-03-30 ENCOUNTER — Ambulatory Visit
Admission: RE | Admit: 2020-03-30 | Discharge: 2020-03-30 | Disposition: A | Discharge status: Home / Self Care | Payer: Medicare PPO | Source: Ambulatory Visit | Attending: Family Medicine | Admitting: Family Medicine

## 2020-03-30 DIAGNOSIS — G500 Trigeminal neuralgia: Secondary | ICD-10-CM

## 2020-03-30 MED ORDER — GADOBENATE DIMEGLUMINE 529 MG/ML IV SOLN
17.0000 mL | Freq: Once | INTRAVENOUS | Status: AC | PRN
  Administered 2020-03-30: 17 mL via INTRAVENOUS

## 2020-04-04 ENCOUNTER — Encounter: Payer: Self-pay | Admitting: Family Medicine

## 2020-04-04 DIAGNOSIS — G500 Trigeminal neuralgia: Secondary | ICD-10-CM

## 2020-04-04 MED ORDER — CARBAMAZEPINE 200 MG PO TABS: 100.0000 mg | ORAL_TABLET | Freq: Two times a day (BID) | ORAL | 0 refills | Status: DC

## 2020-04-07 ENCOUNTER — Telehealth: Payer: Self-pay | Admitting: *Deleted

## 2020-04-07 NOTE — Telephone Encounter (Signed)
"  Rejection Reason - Patient Declined" [facility]

## 2020-04-10 NOTE — Telephone Encounter (Signed)
Noted  

## 2020-04-12 ENCOUNTER — Other Ambulatory Visit: Payer: Medicare PPO

## 2020-04-20 ENCOUNTER — Other Ambulatory Visit: Payer: Self-pay

## 2020-04-20 ENCOUNTER — Other Ambulatory Visit (INDEPENDENT_AMBULATORY_CARE_PROVIDER_SITE_OTHER): Payer: Medicare PPO

## 2020-04-20 DIAGNOSIS — N289 Disorder of kidney and ureter, unspecified: Secondary | ICD-10-CM | POA: Diagnosis not present

## 2020-04-20 DIAGNOSIS — R519 Headache, unspecified: Secondary | ICD-10-CM

## 2020-04-20 DIAGNOSIS — R7982 Elevated C-reactive protein (CRP): Secondary | ICD-10-CM

## 2020-04-20 LAB — BASIC METABOLIC PANEL
BUN: 21 mg/dL (ref 6–23)
CO2: 27 mEq/L (ref 19–32)
Calcium: 8.9 mg/dL (ref 8.4–10.5)
Chloride: 104 mEq/L (ref 96–112)
Creatinine, Ser: 1.39 mg/dL — ABNORMAL HIGH (ref 0.40–1.20)
GFR: 36.27 mL/min — ABNORMAL LOW (ref >60.00)
Glucose, Bld: 98 mg/dL (ref 70–99)
Potassium: 4.6 mEq/L (ref 3.5–5.1)
Sodium: 136 mEq/L (ref 135–145)

## 2020-04-20 LAB — HIGH SENSITIVITY CRP: CRP, High Sensitivity: 31.72 mg/L — ABNORMAL HIGH (ref 0.000–5.000)

## 2020-04-23 ENCOUNTER — Encounter: Payer: Self-pay | Admitting: Family Medicine

## 2020-04-26 ENCOUNTER — Other Ambulatory Visit: Payer: Self-pay | Admitting: Family Medicine

## 2020-04-26 DIAGNOSIS — G500 Trigeminal neuralgia: Secondary | ICD-10-CM

## 2020-04-26 DIAGNOSIS — R7982 Elevated C-reactive protein (CRP): Secondary | ICD-10-CM

## 2020-04-26 DIAGNOSIS — M810 Age-related osteoporosis without current pathological fracture: Secondary | ICD-10-CM

## 2020-04-27 ENCOUNTER — Other Ambulatory Visit: Payer: Self-pay | Admitting: Family Medicine

## 2020-04-27 DIAGNOSIS — G500 Trigeminal neuralgia: Secondary | ICD-10-CM

## 2020-04-27 MED ORDER — CARBAMAZEPINE 200 MG PO TABS: 200.0000 mg | ORAL_TABLET | Freq: Two times a day (BID) | ORAL | 1 refills | Status: DC

## 2020-05-02 ENCOUNTER — Encounter: Payer: Self-pay | Admitting: Family Medicine

## 2020-05-04 ENCOUNTER — Encounter: Payer: Self-pay | Admitting: *Deleted

## 2020-05-08 ENCOUNTER — Encounter: Payer: Self-pay | Admitting: Diagnostic Neuroimaging

## 2020-05-08 ENCOUNTER — Ambulatory Visit: Payer: Medicare PPO | Admitting: Diagnostic Neuroimaging

## 2020-05-08 ENCOUNTER — Other Ambulatory Visit: Payer: Self-pay

## 2020-05-08 VITALS — BP 160/78 | HR 81 | Ht 60.0 in | Wt 188.2 lb

## 2020-05-08 DIAGNOSIS — G500 Trigeminal neuralgia: Secondary | ICD-10-CM | POA: Diagnosis not present

## 2020-05-08 NOTE — Progress Notes (Signed)
[facility]  PATIENT: Katherine Brooks DOB: 1936/06/11  REFERRING CLINICIAN: Elby Beck, FNP HISTORY FROM: patient REASON FOR VISIT: new consult   HISTORICAL  CHIEF COMPLAINT:  Chief Complaint  Patient presents with  . Trigeminal neuralgia, elevated CRP    rm 6 New Pt dgtr- Pat  MRI brain done, "problem began July 19th, Gabapentin didn't help, now on carbamazepine which has helped but it makes me feel tired, unsteady"     HISTORY OF PRESENT ILLNESS:   84 year old female here for evaluation of trigeminal neuralgia.  March 27, 2020 patient had onset of left maxillary sharp electrical pain sensations.  Symptoms aggravated by touching or brushing her teeth.  Pain was quite severe.  No other associated symptoms.  No triggering aggravating factors otherwise noted.  Patient was tried on gabapentin which did not help.  She was changed to carbamazepine which significantly improved symptoms.  Now patient on 200 mg twice a day which is have resolved symptoms.  However patient does have some fatigue and jittery sensations.  Patient has been on this dose for the past 11 days.    REVIEW OF SYSTEMS: Full 14 system review of systems performed and negative with exception of: As per HPI.  ALLERGIES: Allergies  Allergen Reactions  . Lisinopril Other (See Comments)    "passed out"    HOME MEDICATIONS: Outpatient Medications Prior to Visit  Medication Sig Dispense Refill  . alendronate (FOSAMAX) 70 MG tablet Take 1 tablet (70 mg total) by mouth once a week. Take with a full glass of water on an empty stomach. 12 tablet 2  . amLODipine (NORVASC) 5 MG tablet Take 1 tablet (5 mg total) by mouth daily. 90 tablet 2  . carbamazepine (TEGRETOL) 200 MG tablet Take 1 tablet (200 mg total) by mouth in the morning and at bedtime. 180 tablet 1   No facility-administered medications prior to visit.    PAST MEDICAL HISTORY: Past Medical History:  Diagnosis Date  . Allergy   .  Cancer ([place])    skin  . Facial pain   . Hypertension     PAST SURGICAL HISTORY: Past Surgical History:  Procedure Laterality Date  . salivary gland N/A 12/2012    FAMILY HISTORY: Family History  Problem Relation Age of Onset  . Heart disease Mother   . Cancer Sister   . COPD Brother     SOCIAL HISTORY: Social History   Socioeconomic History  . Marital status: Married    Spouse name: Joneen Boers  . Number of children: 2  . Years of education: Not on file  . Highest education level: Some college, no degree  Occupational History    Comment: Network engineer, school system  Tobacco Use  . Smoking status: Never Smoker  . Smokeless tobacco: Never Used  Substance and Sexual Activity  . Alcohol use: No  . Drug use: No  . Sexual activity: Yes  Other Topics Concern  . Not on file  Social History Narrative   Lives with husband   Social Determinants of Health   Financial Resource Strain: Low Risk   . Difficulty of Paying Living Expenses: Not hard at all  Food Insecurity: No Food Insecurity  . Worried About Charity fundraiser in the Last Year: Never true  . Ran Out of Food in the Last Year: Never true  Transportation Needs: No Transportation Needs  . Lack of Transportation (Medical): No  . Lack of Transportation (Non-Medical): No  Physical Activity: Inactive  .  Days of Exercise per Week: 0 days  . Minutes of Exercise per Session: 0 min  Stress: No Stress Concern Present  . Feeling of Stress : Not at all  Social Connections:   . Frequency of Communication with Friends and Family: Not on file  . Frequency of Social Gatherings with Friends and Family: Not on file  . Attends Religious Services: Not on file  . Active Member of Clubs or Organizations: Not on file  . Attends Archivist Meetings: Not on file  . Marital Status: Not on file  Intimate Partner Violence: Not At Risk  . Fear of Current or Ex-Partner: No  . Emotionally Abused: No  . Physically Abused: No  .  Sexually Abused: No     PHYSICAL EXAM  GENERAL EXAM/CONSTITUTIONAL: Vitals:  Vitals:   05/08/20 1028  BP: (!) 160/78  Pulse: 81  Weight: 188 lb 3.2 oz (85.4 kg)  Height: 5' (1.524 m)     Body mass index is 36.76 kg/m. Wt Readings from Last 3 Encounters:  05/08/20 188 lb 3.2 oz (85.4 kg)  03/27/20 186 lb (84.4 kg)  07/07/19 186 lb 9.6 oz (84.6 kg)     Patient is in no distress; well developed, nourished and groomed; neck is supple  CARDIOVASCULAR:  Examination of carotid arteries is normal; no carotid bruits  Regular rate and rhythm, no murmurs  Examination of peripheral vascular system by observation and palpation is normal  EYES:  Ophthalmoscopic exam of optic discs and posterior segments is normal; no papilledema or hemorrhages  No exam data present  MUSCULOSKELETAL:  Gait, strength, tone, movements noted in Neurologic exam below  NEUROLOGIC: MENTAL STATUS:  MMSE - Mini Mental State Exam 07/05/2019  Orientation to time 5  Orientation to Place 5  Registration 3  Attention/ Calculation 5  Recall 3  Language- repeat 1    awake, alert, oriented to person, place and time  recent and remote memory intact  normal attention and concentration  language fluent, comprehension intact, naming intact  fund of knowledge appropriate  CRANIAL NERVE:   2nd - no papilledema on fundoscopic exam  2nd, 3rd, 4th, 6th - pupils equal and reactive to light, visual fields full to confrontation, extraocular muscles intact, no nystagmus  5th - facial sensation symmetric  7th - facial strength symmetric  8th - hearing intact  9th - palate elevates symmetrically, uvula midline  11th - shoulder shrug symmetric  12th - tongue protrusion midline  MOTOR:   normal bulk and tone, full strength in the BUE, BLE  SENSORY:   normal and symmetric to light touch, pinprick, temperature, vibration  COORDINATION:   finger-nose-finger, fine finger movements  normal  REFLEXES:   deep tendon reflexes present and symmetric  GAIT/STATION:   narrow based gait; able to walk on toes, heels and tandem; romberg is negative     DIAGNOSTIC DATA (LABS, IMAGING, TESTING) - I reviewed patient records, labs, notes, testing and imaging myself where available.  Lab Results  Component Value Date   WBC 10.4 03/27/2020   HGB 13.0 03/27/2020   HCT 39.4 03/27/2020   MCV 86.3 03/27/2020   PLT 259.0 03/27/2020      Component Value Date/Time   NA 136 04/20/2020 0923   K 4.6 04/20/2020 0923   CL 104 04/20/2020 0923   CO2 27 04/20/2020 0923   GLUCOSE 98 04/20/2020 0923   BUN 21 04/20/2020 0923   CREATININE 1.39 (H) 04/20/2020 0923   CREATININE 1.29 (H) 03/01/2016  9233   CALCIUM 8.9 04/20/2020 0923   PROT 7.1 07/05/2019 0848   ALBUMIN 4.1 07/05/2019 0848   AST 15 07/05/2019 0848   ALT 9 07/05/2019 0848   ALKPHOS 65 07/05/2019 0848   BILITOT 0.4 07/05/2019 0848   GFRNONAA 36 (L) 03/17/2018 0416   GFRNONAA 40 (L) 03/01/2016 0924   GFRAA 42 (L) 03/17/2018 0416   GFRAA 46 (L) 03/01/2016 0924   Lab Results  Component Value Date   CHOL 191 06/24/2018   HDL 60.50 06/24/2018   LDLCALC 116 (H) 06/24/2018   TRIG 71.0 06/24/2018   CHOLHDL 3 06/24/2018   Lab Results  Component Value Date   HGBA1C 5.6 06/24/2018   No results found for: VITAMINB12 Lab Results  Component Value Date   TSH 2.44 03/20/2018    03/30/20 MRI brain (with and without) [I reviewed images myself and agree with interpretation. -VRP]  - No mass or abnormal enhancement. The left superior cerebellar artery may have mass effect on the left trigeminal nerve root entry zone.     ASSESSMENT AND PLAN  84 y.o. year old female here with:  Dx:  1. Left-sided trigeminal neuralgia     PLAN:  LEFT V2 trigeminal neuralgia (idiopathic; possible microvascular compression) - continue carbamazepine 200mg  twice a day x 2-4 more weeks; then may consider to reduce / taper off -  monitor WBC and sodium levels (every 3-6 months) -If symptoms significant worsen or become intractable, then could consider gamma knife for microvascular decompression referral  ELEVATED CRP - non-specific finding; unclear significance in relation to idiopathic trigeminal neuralgia and unremarkable brain MRI; monitor per PCP  Return for pending if symptoms worsen or fail to improve, return to PCP.    Penni Bombard, MD 0/03/6225, 33:35 AM Certified in Neurology, Neurophysiology and Neuroimaging  [facility] [address] [place], [place] [address] [phone]

## 2020-05-08 NOTE — Patient Instructions (Signed)
LEFT V2 trigeminal neuralgia (idiopathic; possible microvascular compression) - continue carbamazepine 200mg  twice a day x 2-4 more weeks; then may consider to reduce / taper off - monitor WBC and sodium levels (every 3-6 months)

## 2020-06-05 ENCOUNTER — Other Ambulatory Visit: Payer: Self-pay | Admitting: Family Medicine

## 2020-06-05 DIAGNOSIS — I10 Essential (primary) hypertension: Secondary | ICD-10-CM

## 2020-06-05 DIAGNOSIS — M810 Age-related osteoporosis without current pathological fracture: Secondary | ICD-10-CM

## 2020-07-10 ENCOUNTER — Telehealth: Payer: Self-pay | Admitting: Diagnostic Neuroimaging

## 2020-07-10 NOTE — Telephone Encounter (Signed)
Pt's daughter, Orlean Bradford (on [place]) called, month and half ago allergy reaction to carbamazepine (TEGRETOL) 200 MG tablet. Tried going off the medication, rash and itching cleared up. After restarting medication allergy comes back a week or so. Last 3 weeks have lost sense of taste is not good, gives her ensure. Would like a call from the nurse.

## 2020-07-10 NOTE — Telephone Encounter (Signed)
Called daughter,Katherine Brooks who stated that her mother went off tegretol a while back after getting rash, itching but restarted it when her trigeminal neuralgia pain returned. She stopped it again a week ago due to symptoms returning. Katherine Brooks stated she is not currently having trigeminal pain, but wanted to know what to do if it returns. I advised she monitor and call us if pain returns. Do not restart tegretol. Re: loss of taste Katherine Brooks stated she doesn't even want to eat much due to "awful taste". I advised she call PCP to have mom evaluated for possible infection in mouth such as yeast. Katherine Brooks  verbalized understanding, appreciation.

## 2020-07-10 NOTE — Telephone Encounter (Signed)
Agree. -VRP 

## 2020-07-12 ENCOUNTER — Ambulatory Visit (INDEPENDENT_AMBULATORY_CARE_PROVIDER_SITE_OTHER): Payer: Medicare PPO | Admitting: Family Medicine

## 2020-07-12 ENCOUNTER — Other Ambulatory Visit: Payer: Self-pay

## 2020-07-12 ENCOUNTER — Encounter: Payer: Self-pay | Admitting: Family Medicine

## 2020-07-12 VITALS — BP 140/82 | HR 81 | Temp 97.8°F | Ht 60.0 in | Wt 171.0 lb

## 2020-07-12 DIAGNOSIS — Z23 Encounter for immunization: Secondary | ICD-10-CM

## 2020-07-12 DIAGNOSIS — B370 Candidal stomatitis: Secondary | ICD-10-CM | POA: Diagnosis not present

## 2020-07-12 DIAGNOSIS — R432 Parageusia: Secondary | ICD-10-CM | POA: Diagnosis not present

## 2020-07-12 MED ORDER — NYSTATIN 100000 UNIT/ML MT SUSP: 5.0000 mL | Freq: Four times a day (QID) | OROMUCOSAL | 1 refills | Status: DC

## 2020-07-12 NOTE — Progress Notes (Signed)
Subjective:    Patient ID: Katherine Brooks, female    DOB: 1936-04-26, 84 y.o.   MRN: [id number]  HPI Chief Complaint  Patient presents with  . Thrush    started with bad taste over summer increased in last 4 weeks.    This is an 84 yo female who presents today with above cc. Things have been crazy and increased stress at her home, major renovations, husband with recent surgery and wound.  Over the summer, started with aversion to cucumbers, tomatoes- had bad taste. Now all foods with bad taste, living on Ensure and water.  Using a special mouthwash. Can swallow ok. Doesn't taste Ensure, good energy. She has been very physically active. Food feels like it is expanding in her mouth, no pain. Saw white coating on mouth. Not hungry. Mouth is dry in am.  Decreased sense of smell for a long time (years).  Trigeminal neuralgia- was taking prn carbamazepine but is doing better. Had a rash and quit medication. Saw neurology. MRI 03/30/20- without mass or abnormal enhancement, left cerebellar artery may have mass effect on the left trigeminal nerve root entry zone.   Review of Systems Per HPI    Objective:   Physical Exam Constitutional:      General: She is not in acute distress.    Appearance: Normal appearance. She is obese. She is not ill-appearing, toxic-appearing or diaphoretic.  HENT:     Head: Normocephalic and atraumatic.     Nose: Nose normal.     Mouth/Throat:     Mouth: Mucous membranes are moist.     Comments: Slight white coating on tongue, able to scrape.  Eyes:     Conjunctiva/sclera: Conjunctivae normal.  Cardiovascular:     Rate and Rhythm: Normal rate and regular rhythm.     Heart sounds: Normal heart sounds.  Pulmonary:     Effort: Pulmonary effort is normal.     Breath sounds: Normal breath sounds.  Skin:    General: Skin is warm and dry.  Neurological:     Mental Status: She is alert and oriented to person, place, and time.  Psychiatric:        Mood and Affect:  Mood normal.        Behavior: Behavior normal.        Thought Content: Thought content normal.        Judgment: Judgment normal.       BP 140/82   Pulse 81   Temp 97.8 F (36.6 C) (Temporal)   Ht 5' (1.524 m)   Wt 171 lb (77.6 kg)   SpO2 97%   BMI 33.40 kg/m  Wt Readings from Last 3 Encounters:  07/12/20 171 lb (77.6 kg)  05/08/20 188 lb 3.2 oz (85.4 kg)  03/27/20 186 lb (84.4 kg)       Assessment & Plan:  1. Dysgeusia - unclear etiology, will treat for candida given white coating of tongue, if no improvement, will need follow up with dentist, ENT, possible labs (CBC, CRP, CMET, ANA, TSH)  2. Oral candida - nystatin (MYCOSTATIN) 100000 UNIT/ML suspension; Take 5 mLs (500,000 Units total) by mouth 4 (four) times daily.  Dispense: 200 mL; Refill: 1  3. Need for immunization against influenza - Flu Vaccine QUAD High Dose(Fluad)  This visit occurred during the SARS-CoV-2 public health emergency.  Safety protocols were in place, including screening questions prior to the visit, additional usage of staff PPE, and extensive cleaning of exam room while observing  appropriate contact time as indicated for disinfecting solutions.    Clarene Reamer, FNP-BC  [facility]  07/16/2020 2:07 PM

## 2020-07-12 NOTE — Patient Instructions (Addendum)
Influenza (Flu) Vaccine (Inactivated or Recombinant): What You Need to Know 1. Why get vaccinated? Influenza vaccine can prevent influenza (flu). Flu is a contagious disease that spreads around the [place] every year, usually between October and May. Anyone can get the flu, but it is more dangerous for some people. Infants and young children, people 82 years of age and older, pregnant women, and people with certain health conditions or a weakened immune system are at greatest risk of flu complications. Pneumonia, bronchitis, sinus infections and ear infections are examples of flu-related complications. If you have a medical condition, such as heart disease, cancer or diabetes, flu can make it worse. Flu can cause fever and chills, sore throat, muscle aches, fatigue, cough, headache, and runny or stuffy nose. Some people may have vomiting and diarrhea, though this is more common in children than adults. Each year thousands of people in the [place] die from flu, and many more are hospitalized. Flu vaccine prevents millions of illnesses and flu-related visits to the doctor each year. 2. Influenza vaccine CDC recommends everyone 22 months of age and older get vaccinated every flu season. Children 6 months through 38 years of age may need 2 doses during a single flu season. Everyone else needs only 1 dose each flu season. It takes about 2 weeks for protection to develop after vaccination. There are many flu viruses, and they are always changing. Each year a new flu vaccine is made to protect against three or four viruses that are likely to cause disease in the upcoming flu season. Even when the vaccine doesn't exactly match these viruses, it may still provide some protection. Influenza vaccine does not cause flu. Influenza vaccine may be given at the same time as other vaccines. 3. Talk with your health care provider Tell your vaccine provider if the person getting the vaccine:  Has  had an allergic reaction after a previous dose of influenza vaccine, or has any severe, life-threatening allergies.  Has ever had Guillain-Barr Syndrome (also called GBS). In some cases, your health care provider may decide to postpone influenza vaccination to a future visit. People with minor illnesses, such as a cold, may be vaccinated. People who are moderately or severely ill should usually wait until they recover before getting influenza vaccine. Your health care provider can give you more information. 4. Risks of a vaccine reaction  Soreness, redness, and swelling where shot is given, fever, muscle aches, and headache can happen after influenza vaccine.  There may be a very small increased risk of Guillain-Barr Syndrome (GBS) after inactivated influenza vaccine (the flu shot). Young children who get the flu shot along with pneumococcal vaccine (PCV13), and/or DTaP vaccine at the same time might be slightly more likely to have a seizure caused by fever. Tell your health care provider if a child who is getting flu vaccine has ever had a seizure. People sometimes faint after medical procedures, including vaccination. Tell your provider if you feel dizzy or have vision changes or ringing in the ears. As with any medicine, there is a very remote chance of a vaccine causing a severe allergic reaction, other serious injury, or death. 5. What if there is a serious problem? An allergic reaction could occur after the vaccinated person leaves the clinic. If you see signs of a severe allergic reaction (hives, swelling of the face and throat, difficulty breathing, a fast heartbeat, dizziness, or weakness), call 9-1-1 and get the person to the nearest hospital.  For other signs that concern you, call your health care provider. Adverse reactions should be reported to the Vaccine Adverse Event Reporting System (VAERS). Your health care provider will usually file this report, or you can do it yourself. Visit  the VAERS website at www.vaers.SamedayNews.es or call 612 350 1301.VAERS is only for reporting reactions, and VAERS staff do not give medical advice. 6. The National Vaccine Injury Compensation Program The [employer] Vaccine Injury Compensation Program (VICP) is a federal program that was created to compensate people who may have been injured by certain vaccines. Visit the VICP website at GoldCloset.com.ee or call (858)065-7832 to learn about the program and about filing a claim. There is a time limit to file a claim for compensation. 7. How can I learn more?  Ask your healthcare provider.  Call your local or state health department.  Contact the Centers for Disease Control and Prevention (CDC): ? Call (785)016-8524 (1-800-CDC-INFO) or ? Visit CDC's https://gibson.com/ Vaccine Information Statement (Interim) Inactivated Influenza Vaccine (04/23/2018) This information is not intended to replace advice given to you by your health care provider. Make sure you discuss any questions you have with your health care provider. Document Revised: 12/15/2018 Document Reviewed: 04/27/2018 Elsevier Patient Education  2020 [place], Adult  Oral thrush is an infection in your mouth and throat. It causes white patches on your tongue and in your mouth. Follow these instructions at home: Helping with soreness   To lessen your pain: ? Drink cold liquids, like water and iced tea. ? Eat frozen ice pops or frozen juices. ? Eat foods that are easy to swallow, like gelatin and ice cream. ? Drink from a straw if the patches in your mouth are painful. General instructions  Take or use over-the-counter and prescription medicines only as told by your doctor. Medicine for oral thrush may be something to swallow, or it may be something to put on the infected area.  Eat plain yogurt that has live cultures in it. Read the label to make sure.  If you wear dentures: ? Take out your dentures  before you go to bed. ? Brush them well. ? Soak them in a denture cleaner.  Rinse your mouth with warm salt-water many times a day. To make the salt-water mixture, completely dissolve 1/2-1 teaspoon of salt in 1 cup of warm water. Contact a doctor if:  Your problems are getting worse.  Your problems do not get better in less than 7 days with treatment.  Your infection is spreading. This may show as white patches on the skin outside of your mouth.  You are nursing your baby and you have redness and pain in the nipples. This information is not intended to replace advice given to you by your health care provider. Make sure you discuss any questions you have with your health care provider. Document Revised: 11/28/2017 Document Reviewed: 05/20/2016 Elsevier Patient Education  [place].

## 2020-07-16 ENCOUNTER — Encounter: Payer: Self-pay | Admitting: Family Medicine

## 2020-08-14 ENCOUNTER — Other Ambulatory Visit: Payer: Self-pay | Admitting: Family Medicine

## 2020-08-14 DIAGNOSIS — I10 Essential (primary) hypertension: Secondary | ICD-10-CM

## 2020-08-14 NOTE — Telephone Encounter (Signed)
Pharmacy requests refill on: Amlodipine 5 mg   LAST REFILL: 06/06/2020 (Q-90, R-0) LAST OV: 07/12/2020 NEXT OV: 09/06/2020 PHARMACY: [place], [place]

## 2020-08-21 ENCOUNTER — Other Ambulatory Visit: Payer: Self-pay | Admitting: Family Medicine

## 2020-08-21 ENCOUNTER — Encounter: Payer: Self-pay | Admitting: Family Medicine

## 2020-08-21 ENCOUNTER — Other Ambulatory Visit: Payer: Medicare PPO

## 2020-08-21 ENCOUNTER — Other Ambulatory Visit (INDEPENDENT_AMBULATORY_CARE_PROVIDER_SITE_OTHER): Payer: Medicare PPO

## 2020-08-21 ENCOUNTER — Other Ambulatory Visit: Payer: Self-pay

## 2020-08-21 DIAGNOSIS — I10 Essential (primary) hypertension: Secondary | ICD-10-CM | POA: Diagnosis not present

## 2020-08-21 DIAGNOSIS — N289 Disorder of kidney and ureter, unspecified: Secondary | ICD-10-CM

## 2020-08-21 DIAGNOSIS — R7982 Elevated C-reactive protein (CRP): Secondary | ICD-10-CM

## 2020-08-21 DIAGNOSIS — E669 Obesity, unspecified: Secondary | ICD-10-CM | POA: Diagnosis not present

## 2020-08-21 LAB — COMPREHENSIVE METABOLIC PANEL
ALT: 10 U/L (ref 0–35)
AST: 15 U/L (ref 0–37)
Albumin: 4.1 g/dL (ref 3.5–5.2)
Alkaline Phosphatase: 55 U/L (ref 39–117)
BUN: 22 mg/dL (ref 6–23)
CO2: 25 mEq/L (ref 19–32)
Calcium: 9.5 mg/dL (ref 8.4–10.5)
Chloride: 105 mEq/L (ref 96–112)
Creatinine, Ser: 1.74 mg/dL — ABNORMAL HIGH (ref 0.40–1.20)
GFR: 26.97 mL/min — ABNORMAL LOW (ref >60.00)
Glucose, Bld: 91 mg/dL (ref 70–99)
Potassium: 4.5 mEq/L (ref 3.5–5.1)
Sodium: 140 mEq/L (ref 135–145)
Total Bilirubin: 0.5 mg/dL (ref 0.2–1.2)
Total Protein: 7.2 g/dL (ref 6.0–8.3)

## 2020-08-21 LAB — LIPID PANEL
Cholesterol: 187 mg/dL (ref 0–200)
HDL: 57.2 mg/dL (ref >39.00)
LDL Cholesterol: 111 mg/dL — ABNORMAL HIGH (ref 0–99)
NonHDL: 130.23
Total CHOL/HDL Ratio: 3
Triglycerides: 95 mg/dL (ref 0.0–149.0)
VLDL: 19 mg/dL (ref 0.0–40.0)

## 2020-08-21 LAB — HIGH SENSITIVITY CRP: CRP, High Sensitivity: 8.33 mg/L — ABNORMAL HIGH (ref 0.000–5.000)

## 2020-08-23 DIAGNOSIS — H524 Presbyopia: Secondary | ICD-10-CM | POA: Diagnosis not present

## 2020-08-23 DIAGNOSIS — H16221 Keratoconjunctivitis sicca, not specified as Sjogren's, right eye: Secondary | ICD-10-CM | POA: Diagnosis not present

## 2020-08-23 DIAGNOSIS — H2513 Age-related nuclear cataract, bilateral: Secondary | ICD-10-CM | POA: Diagnosis not present

## 2020-08-23 DIAGNOSIS — H25013 Cortical age-related cataract, bilateral: Secondary | ICD-10-CM | POA: Diagnosis not present

## 2020-08-24 NOTE — Telephone Encounter (Signed)
Will you please call patient and put her on my schedule for Monday, December 20?

## 2020-08-28 ENCOUNTER — Ambulatory Visit: Payer: Medicare PPO | Admitting: Family Medicine

## 2020-08-28 ENCOUNTER — Encounter: Payer: Self-pay | Admitting: Family Medicine

## 2020-08-28 ENCOUNTER — Other Ambulatory Visit: Payer: Self-pay

## 2020-08-28 VITALS — BP 126/66 | HR 80 | Temp 97.4°F | Ht 60.0 in | Wt 165.5 lb

## 2020-08-28 DIAGNOSIS — Z0000 Encounter for general adult medical examination without abnormal findings: Secondary | ICD-10-CM | POA: Diagnosis not present

## 2020-08-28 DIAGNOSIS — E6609 Other obesity due to excess calories: Secondary | ICD-10-CM | POA: Diagnosis not present

## 2020-08-28 DIAGNOSIS — I10 Essential (primary) hypertension: Secondary | ICD-10-CM

## 2020-08-28 DIAGNOSIS — R944 Abnormal results of kidney function studies: Secondary | ICD-10-CM

## 2020-08-28 DIAGNOSIS — R432 Parageusia: Secondary | ICD-10-CM | POA: Diagnosis not present

## 2020-08-28 DIAGNOSIS — Z6832 Body mass index (BMI) 32.0-32.9, adult: Secondary | ICD-10-CM

## 2020-08-28 NOTE — Patient Instructions (Signed)
Good to see you today  Please schedule a follow up/ transfer of care iwht Dr. Glori Bickers

## 2020-08-28 NOTE — Progress Notes (Signed)
Subjective:    Patient ID: Katherine Brooks, female    DOB: [birth year], 84 y.o.   MRN: [id number]  HPI Chief Complaint  Patient presents with  . Annual Exam   This is an 84 yo female who presents today for annual exam.   Last CPE-07/07/2019 Mammo- no longer screening Pap- no longer screening Flu-annual Covid 19 vaccine-fully vaccinated Eye- [facility]- regular Exercise- very active around her house and with yard work  Trigeminal neuralgia-was seen by neurology.  She was not able to tolerate carbamazepine, she had itching.  Thankfully, symptoms have significantly improved.  She has very rare brief episodes of stabbing pain but improved skin sensitivity.  Dysgeusia-she was seen for this a couple of months ago, at that time also treated her for some oral Candida.  She has had increased ability to taste some flavors.  She feels that she is getting adequate nutrition.  She is drinking 2 Ensure shakes a day that she combines with milk as well as eating at least 2 meals a day.  She reports excellent energy and that she feels well.  Previous sensation of having food feel like cotton in her mouth has significantly improved.   Increased creatinine-has not been taking any NSAIDs.  Good water intake.  Review of Systems  Constitutional: Negative.   HENT:       Per HPI.  Eyes: Negative.   Respiratory: Negative.   Cardiovascular: Negative.   Gastrointestinal: Negative.   Endocrine: Negative.   Genitourinary: Negative.   Musculoskeletal: Negative.   Skin: Negative.   Allergic/Immunologic: Negative.   Neurological: Negative.   Hematological: Negative.   Psychiatric/Behavioral: Negative.        Objective:   Physical Exam Physical Exam  Constitutional: She is oriented to person, place, and time. She appears well-developed and well-nourished. No distress.  HENT:  Head: Normocephalic and atraumatic.  Right Ear: External ear normal. TM normal.  Left Ear: External ear normal. TM normal.   Nose: Nose normal.  Mouth/Throat: Oropharynx is clear and moist. No oropharyngeal exudate.  Eyes: Conjunctivae are normal.   Neck: Normal range of motion. Neck supple. No JVD present. No thyromegaly present.  Cardiovascular: Normal rate, regular rhythm, normal heart sounds and intact distal pulses.   Pulmonary/Chest: Effort normal and breath sounds normal.  Abdominal: Soft. Bowel sounds are normal. She exhibits no distension and no mass. There is no tenderness. There is no rebound and no guarding.  GMusculoskeletal: Normal range of motion. She exhibits no edema or tenderness.  Lymphadenopathy:    She has no cervical adenopathy.  Neurological: She is alert and oriented to person, place, and time.   Skin: Skin is warm and dry. She is not diaphoretic.  Psychiatric: She has a normal mood and affect. Her behavior is normal. Judgment and thought content normal.  Vitals reviewed.     BP 126/66   Pulse 80   Temp (!) 97.4 F (36.3 C) (Temporal)   Ht 5' (1.524 m)   Wt 165 lb 8 oz (75.1 kg)   SpO2 96%   BMI 32.32 kg/m  Wt Readings from Last 3 Encounters:  08/28/20 165 lb 8 oz (75.1 kg)  07/12/20 171 lb (77.6 kg)  05/08/20 188 lb 3.2 oz (85.4 kg)       Assessment & Plan:  1. Annual physical exam - Discussed and encouraged healthy lifestyle choices- adequate sleep, regular exercise, stress management and healthy food choices.    2. Decreased GFR -Follow-up labs in 4 weeks,  continue good fluid intake - Microalbumin / creatinine urine ratio; Future  3. Primary hypertension -Well-controlled on low-dose amlodipine 5 mg.  With her continued weight loss, may be able to come off this.  4. Dysgeusia -Has improved, hopefully will return to normal within the next couple of months  5. Class 1 obesity due to excess calories with serious comorbidity and body mass index (BMI) of 32.0 to 32.9 in adult -She has had weight loss with #4.  Discussed importance of adequate nutrition.  She is  starting home weights and advised her to notify her new PCP if she is not stable with her weight or has any sudden weight loss that is not expected  -Follow-up in 6 months with Dr. Glori Bickers who has agreed to take patient in transfer upon my departure  This visit occurred during the SARS-CoV-2 public health emergency.  Safety protocols were in place, including screening questions prior to the visit, additional usage of staff PPE, and extensive cleaning of exam room while observing appropriate contact time as indicated for disinfecting solutions.      Clarene Reamer, FNP-BC  [facility], [facility]  08/30/2020 10:33 AM

## 2020-08-30 ENCOUNTER — Ambulatory Visit: Payer: Medicare PPO

## 2020-08-30 ENCOUNTER — Encounter: Payer: Self-pay | Admitting: Family Medicine

## 2020-08-30 DIAGNOSIS — R432 Parageusia: Secondary | ICD-10-CM | POA: Insufficient documentation

## 2020-09-06 ENCOUNTER — Encounter: Payer: Medicare PPO | Admitting: Family Medicine

## 2020-09-18 ENCOUNTER — Other Ambulatory Visit: Payer: Medicare PPO

## 2020-09-18 ENCOUNTER — Other Ambulatory Visit: Payer: Self-pay

## 2020-09-18 ENCOUNTER — Telehealth: Payer: Self-pay | Admitting: Family Medicine

## 2020-09-18 ENCOUNTER — Telehealth (INDEPENDENT_AMBULATORY_CARE_PROVIDER_SITE_OTHER): Payer: Medicare PPO | Admitting: Family Medicine

## 2020-09-18 DIAGNOSIS — R944 Abnormal results of kidney function studies: Secondary | ICD-10-CM

## 2020-09-18 DIAGNOSIS — N289 Disorder of kidney and ureter, unspecified: Secondary | ICD-10-CM

## 2020-09-18 LAB — BASIC METABOLIC PANEL
BUN: 24 mg/dL — ABNORMAL HIGH (ref 6–23)
CO2: 26 mEq/L (ref 19–32)
Calcium: 9.4 mg/dL (ref 8.4–10.5)
Chloride: 106 mEq/L (ref 96–112)
Creatinine, Ser: 1.67 mg/dL — ABNORMAL HIGH (ref 0.40–1.20)
GFR: 28.31 mL/min — ABNORMAL LOW (ref >60.00)
Glucose, Bld: 97 mg/dL (ref 70–99)
Potassium: 4.2 mEq/L (ref 3.5–5.1)
Sodium: 138 mEq/L (ref 135–145)

## 2020-09-18 NOTE — Telephone Encounter (Signed)
Patient came in for lab work.  Patient is transferring from [place] to [place] in June.  Patient dropped off a list of her medications.  List is in the rx tower.

## 2020-09-18 NOTE — Addendum Note (Signed)
Addended by: Cloyd Stagers on: 09/18/2020 10:20 AM   Modules accepted: Orders

## 2020-09-18 NOTE — Telephone Encounter (Signed)
-----   Message from Ellamae Sia sent at 09/18/2020  7:58 AM EST ----- Regarding: lab orders for now Need lab orders, looks like kidney repeat??

## 2020-09-18 NOTE — Telephone Encounter (Signed)
Med list updated and lab orders are already in

## 2020-09-19 LAB — MICROALBUMIN / CREATININE URINE RATIO
Creatinine,U: 64.5 mg/dL
Microalb Creat Ratio: 5 mg/g (ref 0.0–30.0)
Microalb, Ur: 3.2 mg/dL — ABNORMAL HIGH (ref 0.0–1.9)

## 2020-09-27 ENCOUNTER — Ambulatory Visit: Payer: Medicare PPO | Admitting: Family Medicine

## 2020-09-28 ENCOUNTER — Ambulatory Visit: Payer: Medicare PPO | Admitting: Family Medicine

## 2020-10-04 ENCOUNTER — Other Ambulatory Visit: Payer: Self-pay

## 2020-10-04 ENCOUNTER — Ambulatory Visit: Payer: Medicare PPO | Admitting: Family Medicine

## 2020-10-04 ENCOUNTER — Encounter: Payer: Self-pay | Admitting: Family Medicine

## 2020-10-04 VITALS — BP 130/62 | HR 80 | Temp 97.3°F | Ht 60.0 in | Wt 169.0 lb

## 2020-10-04 DIAGNOSIS — E669 Obesity, unspecified: Secondary | ICD-10-CM

## 2020-10-04 DIAGNOSIS — N179 Acute kidney failure, unspecified: Secondary | ICD-10-CM

## 2020-10-04 DIAGNOSIS — I10 Essential (primary) hypertension: Secondary | ICD-10-CM | POA: Diagnosis not present

## 2020-10-04 DIAGNOSIS — N289 Disorder of kidney and ureter, unspecified: Secondary | ICD-10-CM | POA: Diagnosis not present

## 2020-10-04 DIAGNOSIS — M810 Age-related osteoporosis without current pathological fracture: Secondary | ICD-10-CM | POA: Diagnosis not present

## 2020-10-04 LAB — POC URINALSYSI DIPSTICK (AUTOMATED)
Bilirubin, UA: NEGATIVE
Blood, UA: NEGATIVE
Glucose, UA: NEGATIVE
Ketones, UA: NEGATIVE
Leukocytes, UA: NEGATIVE
Nitrite, UA: NEGATIVE
Protein, UA: NEGATIVE
Spec Grav, UA: 1.025 (ref 1.010–1.025)
Urobilinogen, UA: NEGATIVE E.U./dL — AB
pH, UA: 5.5 (ref 5.0–8.0)

## 2020-10-04 NOTE — Assessment & Plan Note (Signed)
Poss multifactorial Past lisinopril/ age/dehydration in past with aki, h/o uti  ua and cx today  Consider renal US and nephrol consult   inst to inc water intake and avoid nsaids

## 2020-10-04 NOTE — Assessment & Plan Note (Signed)
Per pt -on alendronate for more than 5 y  Time for a drug holiday and also with CKD will want to hold anyway

## 2020-10-04 NOTE — Patient Instructions (Addendum)
Goal - at least 64 oz of fluid daily (mostly water)    Stop fosamax (the weekly medicines)   Let's check urine today  After that we will make a plan   Try to get most of your carbohydrates from produce (with the exception of white potatoes)  Eat less bread/pasta/rice/snack foods/cereals/sweets and other items from the middle of the grocery store (processed carbs)  Stay active

## 2020-10-04 NOTE — Assessment & Plan Note (Signed)
Discussed how this problem influences overall health and the risks it imposes  Reviewed plan for weight loss with lower calorie diet (via better food choices and also portion control or program like weight watchers) and exercise building up to or more than 30 minutes 5 days per week including some aerobic activity    

## 2020-10-04 NOTE — Assessment & Plan Note (Signed)
bp in fair control at this time  BP Readings from Last 1 Encounters:  10/04/20 130/62   No changes needed Most recent labs reviewed  Disc lifstyle change with low sodium diet and exercise  Taking amlodipine 5 mg daily  Watching renal status closely

## 2020-10-04 NOTE — Assessment & Plan Note (Signed)
Past from dehydration -per pt several y ago

## 2020-10-04 NOTE — Progress Notes (Signed)
Subjective:    Patient ID: Katherine Brooks, female    DOB: 1935/12/28, 85 y.o.   MRN: [id number] This visit occurred during the SARS-CoV-2 public health emergency.  Safety protocols were in place, including screening questions prior to the visit, additional usage of staff PPE, and extensive cleaning of exam room while observing appropriate contact time as indicated for disinfecting solutions.     HPI Pt presents for follow up of chronic health problems  Transfer of care from NP [facility] Readings from Last 3 Encounters:  10/04/20 169 lb (76.7 kg)  08/28/20 165 lb 8 oz (75.1 kg)  07/12/20 171 lb (77.6 kg)   33.01 kg/m Wants to loose weight  Trying to eat more salads   For protein-drinks ensure bid  occ meat (not a lot)   Eats nuts  Lots of dried beans   HTN Takes amlodipine 5 mg daily   bp is stable today  No cp or palpitations or headaches or edema  No side effects to medicines  BP Readings from Last 3 Encounters:  10/04/20 130/62  08/28/20 126/66  07/12/20 140/82     Possible uncontrolled bp in the past    H/o low GFR   Lab Results  Component Value Date   CREATININE 1.67 (H) 09/18/2020   BUN 24 (H) 09/18/2020   NA 138 09/18/2020   K 4.2 09/18/2020   CL 106 09/18/2020   CO2 26 09/18/2020   GFR 28.3 Was 9 in august   Pt states she had no idea she had a kidney problem until a year ago  No symptoms   She had dehydration several years ago -was in the hospital  ? Acute renal injury   Some tylenol but no nsaids  Probably not enough fluid    Lab Results  Component Value Date   MICROALBUR 3.2 (H) 09/18/2020   MICROALBUR 3.9 (H) 10/01/2018   Has never been diabetic    Takes fosamax weekly -more than 5 years  OP dexa 7/19   No h/o kidney stones   ua today Results for orders placed or performed in visit on 10/04/20  POCT Urinalysis Dipstick (Automated)  Result Value Ref Range   Color, UA yellow    Clarity, UA clear    Glucose, UA Negative  Negative   Bilirubin, UA neg    Ketones, UA neg    Spec Grav, UA 1.025 1.010 - 1.025   Blood, UA neg    pH, UA 5.5 5.0 - 8.0   Protein, UA Negative Negative   Urobilinogen, UA negative (A) 0.2 or 1.0 E.U./dL   Nitrite, UA neg    Leukocytes, UA Negative Negative    Patient Active Problem List   Diagnosis Date Noted  . Dysgeusia 08/30/2020  . Obesity (BMI 35.0-39.9 without comorbidity) 03/20/2018  . Sepsis secondary to UTI ([place]) 03/15/2018  . AKI (acute kidney injury) ([place]) 03/15/2018  . Hypokalemia 03/15/2018  . Acute lower UTI 03/15/2018  . Sepsis ([place]) 03/15/2018  . Renal insufficiency, mild 02/18/2011  . Osteoporosis 02/06/2011  . Primary hypertension 10/17/2010  . Hyperlipidemia 10/17/2010   Past Medical History:  Diagnosis Date  . Allergy   . Cancer ([place])    skin  . Facial pain   . Hypertension    Past Surgical History:  Procedure Laterality Date  . salivary gland N/A 12/2012   Social History   Tobacco Use  . Smoking status: Never Smoker  . Smokeless tobacco: Never Used  Substance  Use Topics  . Alcohol use: No  . Drug use: No   Family History  Problem Relation Age of Onset  . Heart disease Mother   . Cancer Sister   . COPD Brother    Allergies  Allergen Reactions  . Lisinopril Other (See Comments)    "passed out"  . Carbamazepine Rash   Current Outpatient Medications on File Prior to Visit  Medication Sig Dispense Refill  . amLODipine (NORVASC) 5 MG tablet TAKE 1 TABLET EVERY DAY 90 tablet 1  . Ascorbic Acid (VITAMIN C) 1000 MG tablet Take 1,000 mg by mouth daily.    . Cholecalciferol (VITAMIN D3) 50 MCG (2000 UT) TABS Take 2,000 Units by mouth daily.    Marland Kitchen MAGNESIUM PO Take 5 mg by mouth daily.    . Multiple Vitamins-Minerals (WOMENS 50+ MULTI VITAMIN/MIN PO) Take 1 capsule by mouth daily.     No current facility-administered medications on file prior to visit.       Review of Systems  Constitutional: Negative for activity change, appetite  change, fatigue, fever and unexpected weight change.  HENT: Negative for congestion, ear pain, rhinorrhea, sinus pressure and sore throat.   Eyes: Negative for pain, redness and visual disturbance.  Respiratory: Negative for cough, shortness of breath and wheezing.   Cardiovascular: Negative for chest pain and palpitations.  Gastrointestinal: Negative for abdominal pain, blood in stool, constipation and diarrhea.  Endocrine: Negative for polydipsia and polyuria.  Genitourinary: Negative for dysuria, frequency and urgency.  Musculoskeletal: Negative for arthralgias, back pain and myalgias.  Skin: Negative for pallor and rash.  Allergic/Immunologic: Negative for environmental allergies.  Neurological: Negative for dizziness, syncope and headaches.  Hematological: Negative for adenopathy. Does not bruise/bleed easily.  Psychiatric/Behavioral: Negative for decreased concentration and dysphoric mood. The patient is not nervous/anxious.        Objective:   Physical Exam Constitutional:      General: She is not in acute distress.    Appearance: Normal appearance. She is obese. She is not ill-appearing.  HENT:     Mouth/Throat:     Mouth: Mucous membranes are moist.  Eyes:     General: No scleral icterus.       Right eye: No discharge.        Left eye: No discharge.     Conjunctiva/sclera: Conjunctivae normal.     Pupils: Pupils are equal, round, and reactive to light.  Neck:     Vascular: No carotid bruit.  Cardiovascular:     Rate and Rhythm: Normal rate and regular rhythm.     Pulses: Normal pulses.     Heart sounds: Normal heart sounds.  Pulmonary:     Effort: Pulmonary effort is normal.     Breath sounds: Normal breath sounds.  Abdominal:     Tenderness: There is no right CVA tenderness or left CVA tenderness.     Comments: No suprapubic tenderness or fullness  No renal bruits  Musculoskeletal:        General: No swelling.     Cervical back: Normal range of motion and neck  supple.     Right lower leg: No edema.     Left lower leg: No edema.  Lymphadenopathy:     Cervical: No cervical adenopathy.  Skin:    Coloration: Skin is not jaundiced or pale.     Findings: No erythema or rash.  Neurological:     Mental Status: She is alert.  Psychiatric:  Mood and Affect: Mood normal.           Assessment & Plan:   Problem List Items Addressed This Visit      Cardiovascular and Mediastinum   Primary hypertension    bp in fair control at this time  BP Readings from Last 1 Encounters:  10/04/20 130/62   No changes needed Most recent labs reviewed  Disc lifstyle change with low sodium diet and exercise  Taking amlodipine 5 mg daily  Watching renal status closely      Relevant Orders   POCT Urinalysis Dipstick (Automated) (Completed)     Musculoskeletal and Integument   Osteoporosis (Chronic)    Per pt -on alendronate for more than 5 y  Time for a drug holiday and also with CKD will want to hold anyway        Genitourinary   Renal insufficiency, mild - Primary (Chronic)    Poss multifactorial Past lisinopril/ age/dehydration in past with aki, h/o uti  ua and cx today  Consider renal US and nephrol consult   inst to inc water intake and avoid nsaids      Relevant Orders   POCT Urinalysis Dipstick (Automated) (Completed)   Urine Culture   AKI (acute kidney injury) ([place])    Past from dehydration -per pt several y ago        Other   Obesity (BMI 35.0-39.9 without comorbidity)    Discussed how this problem influences overall health and the risks it imposes  Reviewed plan for weight loss with lower calorie diet (via better food choices and also portion control or program like weight watchers) and exercise building up to or more than 30 minutes 5 days per week including some aerobic activity

## 2020-10-05 LAB — URINE CULTURE
MICRO NUMBER:: 11459838
Result:: NO GROWTH
SPECIMEN QUALITY:: ADEQUATE

## 2020-10-09 ENCOUNTER — Telehealth: Payer: Self-pay | Admitting: Family Medicine

## 2020-10-09 DIAGNOSIS — I10 Essential (primary) hypertension: Secondary | ICD-10-CM

## 2020-10-09 DIAGNOSIS — N289 Disorder of kidney and ureter, unspecified: Secondary | ICD-10-CM

## 2020-10-09 NOTE — Telephone Encounter (Signed)
-----   Message from Allyn sent at 10/09/2020  3:43 PM EST ----- Patient advised. Patient would like to go to [place] address. Patient states she has particular availability due to her husband's appointments.

## 2020-10-09 NOTE — Telephone Encounter (Signed)
Order done, thanks

## 2020-10-26 ENCOUNTER — Other Ambulatory Visit: Payer: Self-pay

## 2020-10-26 ENCOUNTER — Ambulatory Visit
Admission: RE | Admit: 2020-10-26 | Discharge: 2020-10-26 | Disposition: A | Discharge status: Home / Self Care | Payer: Medicare PPO | Source: Ambulatory Visit | Attending: Family Medicine | Admitting: Family Medicine

## 2020-10-26 DIAGNOSIS — I10 Essential (primary) hypertension: Secondary | ICD-10-CM

## 2020-10-26 DIAGNOSIS — N289 Disorder of kidney and ureter, unspecified: Secondary | ICD-10-CM

## 2020-10-30 ENCOUNTER — Telehealth: Payer: Self-pay | Admitting: *Deleted

## 2020-10-30 DIAGNOSIS — N184 Chronic kidney disease, stage 4 (severe): Secondary | ICD-10-CM | POA: Insufficient documentation

## 2020-10-30 NOTE — Telephone Encounter (Signed)
Pt notified of [place] results and Dr. Marliss Coots recommendations. Pt said we can put referral in but she only wants to see Dr. Edrick Oh with [facility], she said her husband went to him for years and that's the only one she trust. If she can't go to him she's not sure if she wants to proceed with referral, I advise pt PCP will put referral in and if there is an issue with getting an appt with him our [facility] will f/u with her.

## 2020-10-30 NOTE — Telephone Encounter (Signed)
Left VM requesting pt to call the office back 

## 2020-10-30 NOTE — Telephone Encounter (Signed)
Patient returned call.  Patient said she can be reached at [phone].

## 2020-10-30 NOTE — Telephone Encounter (Signed)
-----   Message from Abner Greenspan, MD sent at 10/29/2020  9:15 AM EST ----- Renal ultrasound shows evidence of chronic renal disease (nothing acute)  I want to refer to nephrology for this -let me know if agreeable and what location  Keep up a good fluid intake

## 2020-10-30 NOTE — Telephone Encounter (Signed)
I put the referral through

## 2020-11-08 ENCOUNTER — Ambulatory Visit: Payer: Medicare PPO

## 2020-11-08 ENCOUNTER — Ambulatory Visit (INDEPENDENT_AMBULATORY_CARE_PROVIDER_SITE_OTHER): Payer: Medicare PPO

## 2020-11-08 ENCOUNTER — Other Ambulatory Visit: Payer: Self-pay

## 2020-11-08 DIAGNOSIS — Z0000 Encounter for general adult medical examination without abnormal findings: Secondary | ICD-10-CM

## 2020-11-08 NOTE — Progress Notes (Signed)
Subjective:   Katherine Brooks is a 85 y.o. female who presents for Medicare Annual (Subsequent) preventive examination.  Review of Systems: N/A     I connected with the patient today by telephone and verified that I am speaking with the correct person using two identifiers. Location patient: home Location nurse: work Persons participating in the telephone visit: patient, nurse.   I discussed the limitations, risks, security and privacy concerns of performing an evaluation and management service by telephone and the availability of in person appointments. I also discussed with the patient that there may be a patient responsible charge related to this service. The patient expressed understanding and verbally consented to this telephonic visit.        Cardiac Risk Factors include: advanced age (>77men, >72 women);hypertension     Objective:    Today's Vitals   There is no height or weight on file to calculate BMI.  Advanced Directives 11/08/2020 07/05/2019 03/16/2018 03/15/2018  Does Patient Have a Medical Advance Directive? Yes Yes Yes No  Type of Paramedic of [place];Living will [place];Living will Living will;Healthcare Power of Attorney -  Does patient want to make changes to medical advance directive? - - No - Patient declined -  Copy of [place] in Chart? No - copy requested No - copy requested No - copy requested -  Would patient like information on creating a medical advance directive? - - No - Patient declined No - Patient declined    Current Medications (verified) Outpatient Encounter Medications as of 11/08/2020  Medication Sig  . amLODipine (NORVASC) 5 MG tablet TAKE 1 TABLET EVERY DAY  . Ascorbic Acid (VITAMIN C) 1000 MG tablet Take 1,000 mg by mouth daily.  . Cholecalciferol (VITAMIN D3) 50 MCG (2000 UT) TABS Take 2,000 Units by mouth daily.  Marland Kitchen MAGNESIUM PO Take 5 mg by mouth daily.  . Multiple  Vitamins-Minerals (WOMENS 50+ MULTI VITAMIN/MIN PO) Take 1 capsule by mouth daily.   No facility-administered encounter medications on file as of 11/08/2020.    Allergies (verified) Lisinopril and Carbamazepine   History: Past Medical History:  Diagnosis Date  . Allergy   . Cancer ([place])    skin  . Facial pain   . Hypertension    Past Surgical History:  Procedure Laterality Date  . salivary gland N/A 12/2012   Family History  Problem Relation Age of Onset  . Heart disease Mother   . Cancer Sister   . COPD Brother    Social History   Socioeconomic History  . Marital status: Married    Spouse name: Joneen Boers  . Number of children: 2  . Years of education: Not on file  . Highest education level: Some college, no degree  Occupational History    Comment: Network engineer, school system  Tobacco Use  . Smoking status: Never Smoker  . Smokeless tobacco: Never Used  Substance and Sexual Activity  . Alcohol use: No  . Drug use: No  . Sexual activity: Yes  Other Topics Concern  . Not on file  Social History Narrative   Lives with husband   Social Determinants of Health   Financial Resource Strain: Low Risk   . Difficulty of Paying Living Expenses: Not hard at all  Food Insecurity: No Food Insecurity  . Worried About Charity fundraiser in the Last Year: Never true  . Ran Out of Food in the Last Year: Never true  Transportation Needs: No  Transportation Needs  . Lack of Transportation (Medical): No  . Lack of Transportation (Non-Medical): No  Physical Activity: Inactive  . Days of Exercise per Week: 0 days  . Minutes of Exercise per Session: 0 min  Stress: No Stress Concern Present  . Feeling of Stress : Not at all  Social Connections: Not on file    Tobacco Counseling Counseling given: Not Answered   Clinical Intake:  Pre-visit preparation completed: Yes  Pain : No/denies pain     Nutritional Risks: None Diabetes: No  How often do you need to have someone help  you when you read instructions, pamphlets, or other written materials from your doctor or pharmacy?: 1 - Never What is the last grade level you completed in school?: 1 year of college  Diabetic; No Nutrition Risk Assessment:  Has the patient had any N/V/D within the last 2 months?  No  Does the patient have any non-healing wounds?  No  Has the patient had any unintentional weight loss or weight gain?  No   Diabetes:  Is the patient diabetic?  No  If diabetic, was a CBG obtained today?  N/A Did the patient bring in their glucometer from home?  N/A How often do you monitor your CBG's? N/A.   Financial Strains and Diabetes Management:  Are you having any financial strains with the device, your supplies or your medication? N/A.  Does the patient want to be seen by Chronic Care Management for management of their diabetes?  N/A Would the patient like to be referred to a Nutritionist or for Diabetic Management?  N/A   Interpreter Needed?: No  Information entered by :: CJohnson, LPN   Activities of Daily Living In your present state of health, do you have any difficulty performing the following activities: 11/08/2020 07/12/2020  Hearing? N N  Vision? N N  Difficulty concentrating or making decisions? N N  Walking or climbing stairs? N N  Dressing or bathing? N N  Doing errands, shopping? N N  Preparing Food and eating ? N -  Using the Toilet? N -  In the past six months, have you accidently leaked urine? N -  Do you have problems with loss of bowel control? N -  Managing your Medications? N -  Managing your Finances? N -  Housekeeping or managing your Housekeeping? N -  Some recent data might be hidden    Patient Care Team: Tower, Wynelle Fanny, MD as PCP - General (Family Medicine)  Indicate any recent Medical Services you may have received from other than [facility] providers in the past year (date may be approximate).     Assessment:   This is a routine wellness examination for  [place].  Hearing/Vision screen  Hearing Screening   125Hz  250Hz  500Hz  1000Hz  2000Hz  3000Hz  4000Hz  6000Hz  8000Hz   Right ear:           Left ear:           Vision Screening Comments: Patient gets annual eye exams   Dietary issues and exercise activities discussed: Current Exercise Habits: The patient does not participate in regular exercise at present, Exercise limited by: None identified  Goals    . Patient Stated     07/05/2019, I will try lose some weight in the future.     . Patient Stated     11/08/2020, I will maintain and continue medications as prescribed.      Depression Screen PHQ 2/9 Scores 11/08/2020 07/12/2020 07/07/2019 07/05/2019 07/01/2018 03/20/2018  03/01/2016  PHQ - 2 Score 0 0 0 0 0 0 0  PHQ- 9 Score 0 3 0 0 - - -    Fall Risk Fall Risk  11/08/2020 07/12/2020 07/07/2019 07/05/2019 07/01/2018  Falls in the past year? 0 1 0 0 No  Number falls in past yr: 0 0 - 0 -  Injury with Fall? 0 0 - 0 -  Risk for fall due to : Medication side effect - - Medication side effect -  Follow up Falls evaluation completed;Falls prevention discussed - Falls evaluation completed Falls prevention discussed;Falls evaluation completed -    FALL RISK PREVENTION PERTAINING TO THE HOME:  Any stairs in or around the home? Yes  If so, are there any without handrails? No  Home free of loose throw rugs in walkways, pet beds, electrical cords, etc? Yes  Adequate lighting in your home to reduce risk of falls? Yes   ASSISTIVE DEVICES UTILIZED TO PREVENT FALLS:  Life alert? No  Use of a cane, walker or w/c? No  Grab bars in the bathroom? No  Shower chair or bench in shower? No  Elevated toilet seat or a handicapped toilet? No   TIMED UP AND GO:  Was the test performed? N/A telephone visit .    Cognitive Function: MMSE - Mini Mental State Exam 11/08/2020 07/05/2019  Not completed: Refused -  Orientation to time - 5  Orientation to Place - 5  Registration - 3  Attention/ Calculation - 5   Recall - 3  Language- repeat - 1  Mini Cog  Mini-Cog screen was not completed. Patient declined at this time. Maximum score is 22. A value of 0 denotes this part of the MMSE was not completed or the patient failed this part of the Mini-Cog screening.       Immunizations Immunization History  Administered Date(s) Administered  . Fluad Quad(high Dose 65+) 07/12/2020  . Influenza Split 06/25/2017  . Influenza, High Dose Seasonal PF 06/13/2016, 05/09/2018  . Influenza-Unspecified 07/10/2013, 06/05/2015, 06/13/2016  . PFIZER(Purple Top)SARS-COV-2 Vaccination 09/21/2019, 10/11/2019  . Pneumococcal Conjugate-13 03/01/2016  . Pneumococcal Polysaccharide-23 03/20/2018  . Td 01/14/2002  . Zoster Recombinat (Shingrix) 05/09/2018, 07/15/2018    TDAP status: Due, Education has been provided regarding the importance of this vaccine. Advised may receive this vaccine at local pharmacy or Health Dept. Aware to provide a copy of the vaccination record if obtained from local pharmacy or Health Dept. Verbalized acceptance and understanding.  Flu Vaccine status: Up to date  Pneumococcal vaccine status: Up to date  Covid-19 vaccine status: 2 doses completed, booster due. Patient is aware.   Qualifies for Shingles Vaccine? Yes   Zostavax completed No   Shingrix Completed?: Yes  Screening Tests Health Maintenance  Topic Date Due  . COVID-19 Vaccine (3 - Booster for Pfizer series) 04/09/2020  . TETANUS/TDAP  11/09/2023 (Originally 01/15/2012)  . INFLUENZA VACCINE  Completed  . DEXA SCAN  Completed  . PNA vac Low Risk Adult  Completed  . HPV VACCINES  Aged Out    Health Maintenance  Health Maintenance Due  Topic Date Due  . COVID-19 Vaccine (3 - Booster for Pfizer series) 04/09/2020    Colorectal cancer screening: No longer required.   Mammogram status: No longer required due to age.  Bone Density status: due, Patient will call and have this scheduled.   Lung Cancer Screening: (Low  Dose CT Chest recommended if Age 64-80 years, 30 pack-year currently smoking OR have quit w/in 15years.)  does not qualify.    Additional Screening:  Hepatitis C Screening: does not qualify; Completed N/A  Vision Screening: Recommended annual ophthalmology exams for early detection of glaucoma and other disorders of the eye. Is the patient up to date with their annual eye exam?  Yes  Who is the provider or what is the name of the office in which the patient attends annual eye exams? [facility], Dr. Prudencio Burly If pt is not established with a provider, would they like to be referred to a provider to establish care? No .   Dental Screening: Recommended annual dental exams for proper oral hygiene  Community Resource Referral / Chronic Care Management: CRR required this visit?  No   CCM required this visit?  No      Plan:     I have personally reviewed and noted the following in the patient's chart:   . Medical and social history . Use of alcohol, tobacco or illicit drugs  . Current medications and supplements . Functional ability and status . Nutritional status . Physical activity . Advanced directives . List of other physicians . Hospitalizations, surgeries, and ER visits in previous 12 months . Vitals . Screenings to include cognitive, depression, and falls . Referrals and appointments  In addition, I have reviewed and discussed with patient certain preventive protocols, quality metrics, and best practice recommendations. A written personalized care plan for preventive services as well as general preventive health recommendations were provided to patient.   Due to this being a telephonic visit, the after visit summary with patients personalized plan was offered to patient via office or my-chart. Patient preferred to pick up at office at next visit or via mychart.   Andrez Grime, LPN   [id number]

## 2020-11-08 NOTE — Progress Notes (Signed)
PCP notes:  Health Maintenance: Tdap- insurance  Dexa- due  Covid booster- due    Abnormal Screenings: none   Patient concerns: none   Nurse concerns: none   Next PCP appt.: 02/26/2021 @ 11 am

## 2020-11-08 NOTE — Patient Instructions (Signed)
Katherine Brooks , Thank you for taking time to come for your Medicare Wellness Visit. I appreciate your ongoing commitment to your health goals. Please review the following plan we discussed and let me know if I can assist you in the future.   Screening recommendations/referrals: Colonoscopy: no longer required  Mammogram: no longer required  Bone Density: due, Please call and schedule appointment  Recommended yearly ophthalmology/optometry visit for glaucoma screening and checkup Recommended yearly dental visit for hygiene and checkup  Vaccinations: Influenza vaccine: Up to date, completed 07/12/2020, due 04/2021 Pneumococcal vaccine: Completed series Tdap vaccine: decline-insurance Shingles vaccine: Completed series   Covid-19:2 doses completed, booster due. Patient is aware.  Advanced directives: Please bring a copy of your POA (Power of Attorney) and/or Living Will to your next appointment.  Conditions/risks identified: hypertension  Next appointment: Follow up in one year for your annual wellness visit    Preventive Care 8 Years and Older, Female Preventive care refers to lifestyle choices and visits with your health care provider that can promote health and wellness. What does preventive care include?  A yearly physical exam. This is also called an annual well check.  Dental exams once or twice a year.  Routine eye exams. Ask your health care provider how often you should have your eyes checked.  Personal lifestyle choices, including:  Daily care of your teeth and gums.  Regular physical activity.  Eating a healthy diet.  Avoiding tobacco and drug use.  Limiting alcohol use.  Practicing safe sex.  Taking low-dose aspirin every day.  Taking vitamin and mineral supplements as recommended by your health care provider. What happens during an annual well check? The services and screenings done by your health care provider during your annual well check will depend on  your age, overall health, lifestyle risk factors, and family history of disease. Counseling  Your health care provider may ask you questions about your:  Alcohol use.  Tobacco use.  Drug use.  Emotional well-being.  Home and relationship well-being.  Sexual activity.  Eating habits.  History of falls.  Memory and ability to understand (cognition).  Work and work Statistician.  Reproductive health. Screening  You may have the following tests or measurements:  Height, weight, and BMI.  Blood pressure.  Lipid and cholesterol levels. These may be checked every 5 years, or more frequently if you are over 64 years old.  Skin check.  Lung cancer screening. You may have this screening every year starting at age 52 if you have a 30-pack-year history of smoking and currently smoke or have quit within the past 15 years.  Fecal occult blood test (FOBT) of the stool. You may have this test every year starting at age 75.  Flexible sigmoidoscopy or colonoscopy. You may have a sigmoidoscopy every 5 years or a colonoscopy every 10 years starting at age 67.  Hepatitis C blood test.  Hepatitis B blood test.  Sexually transmitted disease (STD) testing.  Diabetes screening. This is done by checking your blood sugar (glucose) after you have not eaten for a while (fasting). You may have this done every 1-3 years.  Bone density scan. This is done to screen for osteoporosis. You may have this done starting at age 23.  Mammogram. This may be done every 1-2 years. Talk to your health care provider about how often you should have regular mammograms. Talk with your health care provider about your test results, treatment options, and if necessary, the need for more tests. Vaccines  Your health care provider may recommend certain vaccines, such as:  Influenza vaccine. This is recommended every year.  Tetanus, diphtheria, and acellular pertussis (Tdap, Td) vaccine. You may need a Td booster  every 10 years.  Zoster vaccine. You may need this after age 31.  Pneumococcal 13-valent conjugate (PCV13) vaccine. One dose is recommended after age 47.  Pneumococcal polysaccharide (PPSV23) vaccine. One dose is recommended after age 60. Talk to your health care provider about which screenings and vaccines you need and how often you need them. This information is not intended to replace advice given to you by your health care provider. Make sure you discuss any questions you have with your health care provider. Document Released: 09/22/2015 Document Revised: 05/15/2016 Document Reviewed: 06/27/2015 Elsevier Interactive Patient Education  2017 [place] Prevention in the Home Falls can cause injuries. They can happen to people of all ages. There are many things you can do to make your home safe and to help prevent falls. What can I do on the outside of my home?  Regularly fix the edges of walkways and driveways and fix any cracks.  Remove anything that might make you trip as you walk through a door, such as a raised step or threshold.  Trim any bushes or trees on the path to your home.  Use bright outdoor lighting.  Clear any walking paths of anything that might make someone trip, such as rocks or tools.  Regularly check to see if handrails are loose or broken. Make sure that both sides of any steps have handrails.  Any raised decks and porches should have guardrails on the edges.  Have any leaves, snow, or ice cleared regularly.  Use sand or salt on walking paths during winter.  Clean up any spills in your garage right away. This includes oil or grease spills. What can I do in the bathroom?  Use night lights.  Install grab bars by the toilet and in the tub and shower. Do not use towel bars as grab bars.  Use non-skid mats or decals in the tub or shower.  If you need to sit down in the shower, use a plastic, non-slip stool.  Keep the floor dry. Clean up any  water that spills on the floor as soon as it happens.  Remove soap buildup in the tub or shower regularly.  Attach bath mats securely with double-sided non-slip rug tape.  Do not have throw rugs and other things on the floor that can make you trip. What can I do in the bedroom?  Use night lights.  Make sure that you have a light by your bed that is easy to reach.  Do not use any sheets or blankets that are too big for your bed. They should not hang down onto the floor.  Have a firm chair that has side arms. You can use this for support while you get dressed.  Do not have throw rugs and other things on the floor that can make you trip. What can I do in the kitchen?  Clean up any spills right away.  Avoid walking on wet floors.  Keep items that you use a lot in easy-to-reach places.  If you need to reach something above you, use a strong step stool that has a grab bar.  Keep electrical cords out of the way.  Do not use floor polish or wax that makes floors slippery. If you must use wax, use non-skid floor wax.  Do  not have throw rugs and other things on the floor that can make you trip. What can I do with my stairs?  Do not leave any items on the stairs.  Make sure that there are handrails on both sides of the stairs and use them. Fix handrails that are broken or loose. Make sure that handrails are as long as the stairways.  Check any carpeting to make sure that it is firmly attached to the stairs. Fix any carpet that is loose or worn.  Avoid having throw rugs at the top or bottom of the stairs. If you do have throw rugs, attach them to the floor with carpet tape.  Make sure that you have a light switch at the top of the stairs and the bottom of the stairs. If you do not have them, ask someone to add them for you. What else can I do to help prevent falls?  Wear shoes that:  Do not have high heels.  Have rubber bottoms.  Are comfortable and fit you well.  Are closed  at the toe. Do not wear sandals.  If you use a stepladder:  Make sure that it is fully opened. Do not climb a closed stepladder.  Make sure that both sides of the stepladder are locked into place.  Ask someone to hold it for you, if possible.  Clearly mark and make sure that you can see:  Any grab bars or handrails.  First and last steps.  Where the edge of each step is.  Use tools that help you move around (mobility aids) if they are needed. These include:  Canes.  Walkers.  Scooters.  Crutches.  Turn on the lights when you go into a dark area. Replace any light bulbs as soon as they burn out.  Set up your furniture so you have a clear path. Avoid moving your furniture around.  If any of your floors are uneven, fix them.  If there are any pets around you, be aware of where they are.  Review your medicines with your doctor. Some medicines can make you feel dizzy. This can increase your chance of falling. Ask your doctor what other things that you can do to help prevent falls. This information is not intended to replace advice given to you by your health care provider. Make sure you discuss any questions you have with your health care provider. Document Released: 06/22/2009 Document Revised: 02/01/2016 Document Reviewed: 09/30/2014 Elsevier Interactive Patient Education  2017 [employer].

## 2021-02-26 ENCOUNTER — Encounter: Payer: Self-pay | Admitting: Family Medicine

## 2021-02-26 ENCOUNTER — Ambulatory Visit: Payer: Medicare PPO | Admitting: Family Medicine

## 2021-02-26 ENCOUNTER — Other Ambulatory Visit: Payer: Self-pay

## 2021-02-26 VITALS — BP 132/60 | HR 70 | Temp 97.2°F | Ht 60.0 in | Wt 178.0 lb

## 2021-02-26 DIAGNOSIS — M810 Age-related osteoporosis without current pathological fracture: Secondary | ICD-10-CM

## 2021-02-26 DIAGNOSIS — I10 Essential (primary) hypertension: Secondary | ICD-10-CM

## 2021-02-26 DIAGNOSIS — E2839 Other primary ovarian failure: Secondary | ICD-10-CM | POA: Insufficient documentation

## 2021-02-26 DIAGNOSIS — N184 Chronic kidney disease, stage 4 (severe): Secondary | ICD-10-CM

## 2021-02-26 DIAGNOSIS — E7800 Pure hypercholesterolemia, unspecified: Secondary | ICD-10-CM

## 2021-02-26 DIAGNOSIS — E669 Obesity, unspecified: Secondary | ICD-10-CM

## 2021-02-26 LAB — CBC WITH DIFFERENTIAL/PLATELET
Basophils Absolute: 0.1 10*3/uL (ref 0.0–0.1)
Basophils Relative: 1.2 % (ref 0.0–3.0)
Eosinophils Absolute: 0.3 10*3/uL (ref 0.0–0.7)
Eosinophils Relative: 3.9 % (ref 0.0–5.0)
HCT: 35.4 % — ABNORMAL LOW (ref 36.0–46.0)
Hemoglobin: 11.8 g/dL — ABNORMAL LOW (ref 12.0–15.0)
Lymphocytes Relative: 36.9 % (ref 12.0–46.0)
Lymphs Abs: 3.1 10*3/uL (ref 0.7–4.0)
MCHC: 33.2 g/dL (ref 30.0–36.0)
MCV: 85.8 fl (ref 78.0–100.0)
Monocytes Absolute: 0.9 10*3/uL (ref 0.1–1.0)
Monocytes Relative: 11 % (ref 3.0–12.0)
Neutro Abs: 3.9 10*3/uL (ref 1.4–7.7)
Neutrophils Relative %: 47 % (ref 43.0–77.0)
Platelets: 240 10*3/uL (ref 150.0–400.0)
RBC: 4.13 Mil/uL (ref 3.87–5.11)
RDW: 14.9 % (ref 11.5–15.5)
WBC: 8.4 10*3/uL (ref 4.0–10.5)

## 2021-02-26 LAB — COMPREHENSIVE METABOLIC PANEL
ALT: 11 U/L (ref 0–35)
AST: 18 U/L (ref 0–37)
Albumin: 4.1 g/dL (ref 3.5–5.2)
Alkaline Phosphatase: 58 U/L (ref 39–117)
BUN: 33 mg/dL — ABNORMAL HIGH (ref 6–23)
CO2: 25 mEq/L (ref 19–32)
Calcium: 9.4 mg/dL (ref 8.4–10.5)
Chloride: 105 mEq/L (ref 96–112)
Creatinine, Ser: 1.78 mg/dL — ABNORMAL HIGH (ref 0.40–1.20)
GFR: 26.15 mL/min — ABNORMAL LOW (ref >60.00)
Glucose, Bld: 84 mg/dL (ref 70–99)
Potassium: 4.8 mEq/L (ref 3.5–5.1)
Sodium: 139 mEq/L (ref 135–145)
Total Bilirubin: 0.5 mg/dL (ref 0.2–1.2)
Total Protein: 6.6 g/dL (ref 6.0–8.3)

## 2021-02-26 LAB — LIPID PANEL
Cholesterol: 197 mg/dL (ref 0–200)
HDL: 63.2 mg/dL (ref >39.00)
LDL Cholesterol: 119 mg/dL — ABNORMAL HIGH (ref 0–99)
NonHDL: 133.95
Total CHOL/HDL Ratio: 3
Triglycerides: 76 mg/dL (ref 0.0–149.0)
VLDL: 15.2 mg/dL (ref 0.0–40.0)

## 2021-02-26 LAB — TSH: TSH: 3.77 u[IU]/mL (ref 0.35–4.50)

## 2021-02-26 MED ORDER — AMLODIPINE BESYLATE 5 MG PO TABS: 1.0000 | ORAL_TABLET | Freq: Every day | ORAL | 3 refills | Status: DC

## 2021-02-26 NOTE — Progress Notes (Signed)
Subjective:    Patient ID: Katherine Brooks, female    DOB: [birth year], 85 y.o.   MRN: [id number]  This visit occurred during the SARS-CoV-2 public health emergency.  Safety protocols were in place, including screening questions prior to the visit, additional usage of staff PPE, and extensive cleaning of exam room while observing appropriate contact time as indicated for disinfecting solutions.   HPI 85 yo pt of NP Carlean Purl presents to transfer care and address chronic health problems incl HTN and CKD and OP and hyperlipidemia and obesity  Wt Readings from Last 3 Encounters:  02/26/21 178 lb (80.7 kg)  10/04/20 169 lb (76.7 kg)  08/28/20 165 lb 8 oz (75.1 kg)   34.76 kg/m  Feeling good  Taking care of herself  Works outdoors a lot and keeps up with fluids   HTN  bp is stable today  No cp or palpitations or headaches or edema  No side effects to medicines  BP Readings from Last 3 Encounters:  02/26/21 132/60  10/04/20 130/62  08/28/20 126/66    Takes amlodipine 5 mg daily  Note unable to take ace in the past - syncope (? Also renal effects)   Pulse Readings from Last 3 Encounters:  02/26/21 70  10/04/20 80  08/28/20 80     OP Took alendronate and now on drug holiday  Falls-none  Fractures -none Supplements - D3 2000 iu daily Exercise - working in the yard and garden   CKD with GFR of 28.3 Past h/o uti with sepsis Past h/o syncope with ace   Renal US from 2/22   US Renal (Accession [id number]) (Order [id number]) Imaging Date: 10/26/2020 Department: [facility] Released By: Robie Ridge Authorizing: Lenea Bywater, Wynelle Fanny, MD    Exam Status  Status  Final [99]   PACS Intelerad Image Link   Show images for US Renal  Study Result  Narrative & Impression  CLINICAL DATA:  85 year old with renal insufficiency, mild. Primary hypertension.   EXAM: RENAL / URINARY TRACT ULTRASOUND COMPLETE   COMPARISON:  None.    FINDINGS: Right Kidney:   Renal measurements: 8.4 x 4.4 x 3.0 cm = volume: 58 mL. Borderline increased parenchymal echogenicity. No mass or hydronephrosis visualized. No visualized renal calculi.   Left Kidney:   Renal measurements: 8.5 x 4.0 x 3.8 cm = volume: 67 mL. Borderline increased parenchymal echogenicity. 1.4 cm simple cyst in the lower pole. No solid mass or hydronephrosis visualized. No visualized renal calculi.   Bladder:   Appears normal for degree of bladder distention.   Other:   None.   IMPRESSION: 1. Borderline increased parenchymal echogenicity most consistent with chronic medical renal disease. 2. No obstructive uropathy. 3. Incidental small left renal cyst.     Whe was ref to nephrology /[facility]  ? Glomerular sclerosis / multifactorial medical renal dz Watching closely and last seen in May  Adv to hydrate and avoid nsaids and keep bp controlled   Has not had any more utis   Hyperlipidemia Lab Results  Component Value Date   CHOL 187 08/21/2020   HDL 57.20 08/21/2020   LDLCALC 111 (H) 08/21/2020   TRIG 95.0 08/21/2020   CHOLHDL 3 08/21/2020   Manages with diet  She does not eat much meat and no beef  Less fried foods  More vegetables  Berniece Salines occ-not often  No ice cream  Not a lot of cheese  No fast food    Had amw  in march   Patient Active Problem List   Diagnosis Date Noted   Estrogen deficiency 02/26/2021   Chronic kidney disease (CKD) stage G4/A1, severely decreased glomerular filtration rate (GFR) between 15-29 mL/min/1.73 square meter and albuminuria creatinine ratio less than 30 mg/g ([place]) 10/30/2020   Dysgeusia 08/30/2020   Obesity (BMI 35.0-39.9 without comorbidity) 03/20/2018   Hypokalemia 03/15/2018   Renal insufficiency, mild 02/18/2011   Osteoporosis 02/06/2011   Primary hypertension 10/17/2010   Hyperlipidemia 10/17/2010   Past Medical History:  Diagnosis Date   Allergy    Cancer ([place])    skin   Facial  pain    Hypertension    Past Surgical History:  Procedure Laterality Date   salivary gland N/A 12/2012   Social History   Tobacco Use   Smoking status: Never   Smokeless tobacco: Never  Substance Use Topics   Alcohol use: No   Drug use: No   Family History  Problem Relation Age of Onset   Heart disease Mother    Cancer Sister    COPD Brother    Allergies  Allergen Reactions   Lisinopril Other (See Comments)    "passed out"   Carbamazepine Rash   Current Outpatient Medications on File Prior to Visit  Medication Sig Dispense Refill   Ascorbic Acid (VITAMIN C) 1000 MG tablet Take 1,000 mg by mouth daily.     Cholecalciferol (VITAMIN D3) 50 MCG (2000 UT) TABS Take 2,000 Units by mouth daily.     MAGNESIUM PO Take 5 mg by mouth daily.     Multiple Vitamins-Minerals (WOMENS 50+ MULTI VITAMIN/MIN PO) Take 1 capsule by mouth daily.     No current facility-administered medications on file prior to visit.    Review of Systems  Constitutional:  Negative for activity change, appetite change, fatigue, fever and unexpected weight change.  HENT:  Negative for congestion, ear pain, rhinorrhea, sinus pressure and sore throat.   Eyes:  Negative for pain, redness and visual disturbance.  Respiratory:  Negative for cough, shortness of breath and wheezing.   Cardiovascular:  Negative for chest pain and palpitations.  Gastrointestinal:  Negative for abdominal pain, blood in stool, constipation and diarrhea.  Endocrine: Negative for polydipsia and polyuria.  Genitourinary:  Negative for dysuria, frequency and urgency.  Musculoskeletal:  Negative for arthralgias, back pain and myalgias.  Skin:  Negative for pallor and rash.  Allergic/Immunologic: Negative for environmental allergies.  Neurological:  Negative for dizziness, syncope and headaches.  Hematological:  Negative for adenopathy. Does not bruise/bleed easily.  Psychiatric/Behavioral:  Negative for decreased concentration and  dysphoric mood. The patient is not nervous/anxious.       Objective:   Physical Exam Constitutional:      General: She is not in acute distress.    Appearance: Normal appearance. She is well-developed. She is obese. She is not ill-appearing or diaphoretic.  HENT:     Head: Normocephalic and atraumatic.     Mouth/Throat:     Mouth: Mucous membranes are moist.  Eyes:     Conjunctiva/sclera: Conjunctivae normal.     Pupils: Pupils are equal, round, and reactive to light.  Neck:     Thyroid: No thyromegaly.     Vascular: No carotid bruit or JVD.  Cardiovascular:     Rate and Rhythm: Normal rate and regular rhythm.     Heart sounds: Normal heart sounds.    No gallop.  Pulmonary:     Effort: Pulmonary effort is normal. No respiratory distress.  Breath sounds: Normal breath sounds. No wheezing or rales.  Abdominal:     General: Bowel sounds are normal. There is no distension or abdominal bruit.     Palpations: Abdomen is soft. There is no mass.     Tenderness: There is no abdominal tenderness.  Musculoskeletal:     Cervical back: Normal range of motion and neck supple.     Right lower leg: No edema.     Left lower leg: No edema.     Comments: No kyphosis   Lymphadenopathy:     Cervical: No cervical adenopathy.  Skin:    General: Skin is warm and dry.     Coloration: Skin is not pale.     Findings: No rash.  Neurological:     Mental Status: She is alert.     Coordination: Coordination normal.     Deep Tendon Reflexes: Reflexes are normal and symmetric. Reflexes normal.  Psychiatric:        Mood and Affect: Mood normal.        Cognition and Memory: Cognition and memory normal.          Assessment & Plan:   Problem List Items Addressed This Visit       Cardiovascular and Mediastinum   Primary hypertension - Primary    bp in fair control at this time  BP Readings from Last 1 Encounters:  02/26/21 132/60  No changes needed Most recent labs reviewed  Disc  lifstyle change with low sodium diet and exercise  Plan to continue amlodipine 5 mg daily        Relevant Medications   amLODipine (NORVASC) 5 MG tablet   Other Relevant Orders   CBC with Differential/Platelet   Comprehensive metabolic panel   TSH   Lipid panel     Musculoskeletal and Integument   Osteoporosis (Chronic)    Prev took alendronate and now on a drug holiday  No falls or fx  Taking D3 Good exercise  dexa ordered          Genitourinary   Chronic kidney disease (CKD) stage G4/A1, severely decreased glomerular filtration rate (GFR) between 15-29 mL/min/1.73 square meter and albuminuria creatinine ratio less than 30 mg/g ([facility])    In setting of HTN  Followed by nephrology  Pt avoids nsaids and has inc her fluids  No s/s of uti  Renal US showed signs of medical renal dz but no obst uropathy (incidental small L renal cyst)         Relevant Orders   Comprehensive metabolic panel     Other   Hyperlipidemia (Chronic)    Disc goals for lipids and reasons to control them Rev last labs with pt Rev low sat fat diet in detail Diet has improved  Lab today       Relevant Medications   amLODipine (NORVASC) 5 MG tablet   Other Relevant Orders   Lipid panel   Obesity (BMI 35.0-39.9 without comorbidity)    Discussed how this problem influences overall health and the risks it imposes  Reviewed plan for weight loss with lower calorie diet (via better food choices and also portion control or program like weight watchers) and exercise building up to or more than 30 minutes 5 days per week including some aerobic activity   Pt is more active this summer        Estrogen deficiency   Relevant Orders   DG Bone Density   Other Visit Diagnoses  Essential hypertension       Relevant Medications   amLODipine (NORVASC) 5 MG tablet   Other Relevant Orders   CBC with Differential/Platelet   Comprehensive metabolic panel   TSH   Lipid panel

## 2021-02-26 NOTE — Assessment & Plan Note (Signed)
Prev took alendronate and now on a drug holiday  No falls or fx  Taking D3 Good exercise  dexa ordered

## 2021-02-26 NOTE — Assessment & Plan Note (Addendum)
In setting of HTN  Followed by nephrology  Pt avoids nsaids and has inc her fluids  No s/s of uti  Renal US showed signs of medical renal dz but no obst uropathy (incidental small L renal cyst)

## 2021-02-26 NOTE — Assessment & Plan Note (Signed)
Disc goals for lipids and reasons to control them Rev last labs with pt Rev low sat fat diet in detail Diet has improved  Lab today

## 2021-02-26 NOTE — Patient Instructions (Addendum)
For cholesterol Avoid red meat/ fried foods/ egg yolks/ fatty breakfast meats/ butter, cheese and high fat dairy/ and shellfish    Labs today   Please schedule your bone density test I put in the order   Take care of yourself   Keep up a good fluid intake

## 2021-02-26 NOTE — Assessment & Plan Note (Signed)
Discussed how this problem influences overall health and the risks it imposes  Reviewed plan for weight loss with lower calorie diet (via better food choices and also portion control or program like weight watchers) and exercise building up to or more than 30 minutes 5 days per week including some aerobic activity   Pt is more active this summer

## 2021-02-26 NOTE — Assessment & Plan Note (Signed)
bp in fair control at this time  BP Readings from Last 1 Encounters:  02/26/21 132/60   No changes needed Most recent labs reviewed  Disc lifstyle change with low sodium diet and exercise  Plan to continue amlodipine 5 mg daily

## 2021-04-13 DIAGNOSIS — I129 Hypertensive chronic kidney disease with stage 1 through stage 4 chronic kidney disease, or unspecified chronic kidney disease: Secondary | ICD-10-CM | POA: Diagnosis not present

## 2021-04-13 DIAGNOSIS — D631 Anemia in chronic kidney disease: Secondary | ICD-10-CM | POA: Diagnosis not present

## 2021-04-13 DIAGNOSIS — N184 Chronic kidney disease, stage 4 (severe): Secondary | ICD-10-CM | POA: Diagnosis not present

## 2021-04-13 DIAGNOSIS — N189 Chronic kidney disease, unspecified: Secondary | ICD-10-CM | POA: Diagnosis not present

## 2021-04-13 DIAGNOSIS — N2581 Secondary hyperparathyroidism of renal origin: Secondary | ICD-10-CM | POA: Diagnosis not present

## 2021-04-13 DIAGNOSIS — E785 Hyperlipidemia, unspecified: Secondary | ICD-10-CM | POA: Diagnosis not present

## 2021-05-15 DIAGNOSIS — H0012 Chalazion right lower eyelid: Secondary | ICD-10-CM | POA: Diagnosis not present

## 2021-05-16 DIAGNOSIS — H0012 Chalazion right lower eyelid: Secondary | ICD-10-CM | POA: Diagnosis not present

## 2021-06-12 ENCOUNTER — Other Ambulatory Visit: Payer: Self-pay

## 2021-06-12 ENCOUNTER — Emergency Department (HOSPITAL_BASED_OUTPATIENT_CLINIC_OR_DEPARTMENT_OTHER): Payer: Medicare PPO

## 2021-06-12 ENCOUNTER — Encounter (HOSPITAL_BASED_OUTPATIENT_CLINIC_OR_DEPARTMENT_OTHER): Payer: Self-pay | Admitting: *Deleted

## 2021-06-12 ENCOUNTER — Emergency Department (HOSPITAL_BASED_OUTPATIENT_CLINIC_OR_DEPARTMENT_OTHER)
Admission: EM | Admit: 2021-06-12 | Discharge: 2021-06-12 | Disposition: A | Discharge status: Home / Self Care | Payer: Medicare PPO | Attending: Emergency Medicine | Admitting: Emergency Medicine

## 2021-06-12 DIAGNOSIS — I129 Hypertensive chronic kidney disease with stage 1 through stage 4 chronic kidney disease, or unspecified chronic kidney disease: Secondary | ICD-10-CM | POA: Diagnosis not present

## 2021-06-12 DIAGNOSIS — Z23 Encounter for immunization: Secondary | ICD-10-CM | POA: Insufficient documentation

## 2021-06-12 DIAGNOSIS — W270XXA Contact with workbench tool, initial encounter: Secondary | ICD-10-CM | POA: Insufficient documentation

## 2021-06-12 DIAGNOSIS — Z85828 Personal history of other malignant neoplasm of skin: Secondary | ICD-10-CM | POA: Insufficient documentation

## 2021-06-12 DIAGNOSIS — S61112A Laceration without foreign body of left thumb with damage to nail, initial encounter: Secondary | ICD-10-CM | POA: Insufficient documentation

## 2021-06-12 DIAGNOSIS — S61012A Laceration without foreign body of left thumb without damage to nail, initial encounter: Secondary | ICD-10-CM | POA: Diagnosis not present

## 2021-06-12 DIAGNOSIS — N189 Chronic kidney disease, unspecified: Secondary | ICD-10-CM | POA: Diagnosis not present

## 2021-06-12 MED ORDER — TETANUS-DIPHTH-ACELL PERTUSSIS 5-2.5-18.5 LF-MCG/0.5 IM SUSY
0.5000 mL | PREFILLED_SYRINGE | Freq: Once | INTRAMUSCULAR | Status: AC
  Administered 2021-06-12: 0.5 mL via INTRAMUSCULAR
  Filled 2021-06-12: qty 0.5

## 2021-06-12 MED ORDER — CEPHALEXIN 250 MG PO CAPS
500.0000 mg | ORAL_CAPSULE | Freq: Once | ORAL | Status: AC
  Administered 2021-06-12: 500 mg via ORAL
  Filled 2021-06-12: qty 2

## 2021-06-12 MED ORDER — LIDOCAINE HCL (PF) 1 % IJ SOLN
10.0000 mL | Freq: Once | INTRAMUSCULAR | Status: AC
  Administered 2021-06-12: 10 mL
  Filled 2021-06-12: qty 10

## 2021-06-12 MED ORDER — BACITRACIN ZINC 500 UNIT/GM EX OINT: TOPICAL_OINTMENT | Freq: Two times a day (BID) | CUTANEOUS | Status: DC

## 2021-06-12 MED ORDER — CEPHALEXIN 500 MG PO CAPS: 500.0000 mg | ORAL_CAPSULE | Freq: Two times a day (BID) | ORAL | 0 refills | Status: DC

## 2021-06-12 NOTE — Discharge Instructions (Signed)
Please return in 10 to 14 days for suture removal.  Please watch for signs of infection including worsening pain, redness, pus draining from the wound.  Please watch for increasing pain in the nail, you may lose part of your nail.  I have attached the information to hand surgeon in the area if you do need to follow-up evaluation of the nail.

## 2021-06-12 NOTE — ED Triage Notes (Signed)
Laceration to her left thumb with a hand saw this am. Bleeding controlled on arrival.

## 2021-06-12 NOTE — ED Notes (Signed)
Pt present w/ 1.5cm laceration on medial/posterior L thumb.  Bleeding is controlled.  Pt reports cutting it w/ a table saw.  Reports last tetanus was "20 years ago."

## 2021-06-12 NOTE — ED Notes (Signed)
Lidocaine at bedside.  EDP made aware.

## 2021-06-12 NOTE — ED Notes (Signed)
ED Provider at bedside. 

## 2021-06-12 NOTE — ED Provider Notes (Signed)
[facility] EM[facility] DEPARTMENT Provider Note   CSN: [id number] Arrival date & time: 06/12/21  1237     History Chief Complaint  Patient presents with   Laceration    Katherine Brooks is a 85 y.o. female no significant past medical history who presents with a laceration on the posterior left thumb, extending into the nailbed.  Patient reports that she was trying to cut a tennis ball in half with a saw earlier today and it slipped and cut her thumb.  Patient reports that it bled for 45 minutes with pressure.  Patient is not taking any blood thinners at this time.  Patient reports minimal pain, only a dull throb at this time.  Patient does not know when her last tetanus shot was.   Laceration     Past Medical History:  Diagnosis Date   Allergy    Cancer [facility])    skin   Facial pain    Hypertension     Patient Active Problem List   Diagnosis Date Noted   Estrogen deficiency 02/26/2021   Chronic kidney disease (CKD) stage G4/A1, severely decreased glomerular filtration rate (GFR) between 15-29 mL/min/1.73 square meter and albuminuria creatinine ratio less than 30 mg/g ([place]) 10/30/2020   Dysgeusia 08/30/2020   Obesity (BMI 35.0-39.9 without comorbidity) 03/20/2018   Hypokalemia 03/15/2018   Renal insufficiency, mild 02/18/2011   Osteoporosis 02/06/2011   Primary hypertension 10/17/2010   Hyperlipidemia 10/17/2010    Past Surgical History:  Procedure Laterality Date   salivary gland N/A 12/2012     OB History   No obstetric history on file.     Family History  Problem Relation Age of Onset   Heart disease Mother    Cancer Sister    COPD Brother     Social History   Tobacco Use   Smoking status: Never   Smokeless tobacco: Never  Vaping Use   Vaping Use: Never used  Substance Use Topics   Alcohol use: No   Drug use: No    Home Medications Prior to Admission medications   Medication Sig Start Date End Date Taking? Authorizing Provider   amLODipine (NORVASC) 5 MG tablet Take 1 tablet (5 mg total) by mouth daily. 02/26/21  Yes Tower, Wynelle Fanny, MD  Ascorbic Acid (VITAMIN C) 1000 MG tablet Take 1,000 mg by mouth daily.   Yes [provider]  cephALEXin (KEFLEX) 500 MG capsule Take 1 capsule (500 mg total) by mouth 2 (two) times daily. 06/12/21  Yes Jamyra Zweig H, PA-C  Cholecalciferol (VITAMIN D3) 50 MCG (2000 UT) TABS Take 2,000 Units by mouth daily.   Yes [provider]  MAGNESIUM PO Take 5 mg by mouth daily.   Yes [provider]  Multiple Vitamins-Minerals (WOMENS 50+ MULTI VITAMIN/MIN PO) Take 1 capsule by mouth daily.   Yes [provider]    Allergies    Lisinopril and Carbamazepine  Review of Systems   Review of Systems  Skin:  Positive for wound.  All other systems reviewed and are negative.  Physical Exam Updated Vital Signs BP (!) 135/56   Pulse 63   Temp 98.8 F (37.1 C) (Oral)   Resp 16   Ht 5' (1.524 m)   Wt 80.7 kg   SpO2 100%   BMI 34.75 kg/m   Physical Exam Vitals and nursing note reviewed.  Constitutional:      General: She is not in acute distress.    Appearance: Normal appearance.  HENT:  Head: Normocephalic and atraumatic.  Eyes:     General:        Right eye: No discharge.        Left eye: No discharge.  Cardiovascular:     Rate and Rhythm: Normal rate and regular rhythm.  Pulmonary:     Effort: Pulmonary effort is normal. No respiratory distress.  Musculoskeletal:        General: No deformity.  Skin:    General: Skin is warm and dry.     Capillary Refill: Capillary refill takes less than 2 seconds.     Comments: Linear laceration extending from the posterior aspect of the distal phalanx of the left thumb into the proximal aspect of the nailbed.  There is some compression of the nailbed without any hematoma.  Wound bleeds easily with cleaning there is no foreign body.  The entirety of the laceration was visualized.  Neurological:      Mental Status: She is alert and oriented to person, place, and time.  Psychiatric:        Mood and Affect: Mood normal.        Behavior: Behavior normal.    ED Results / Procedures / Treatments   Labs (all labs ordered are listed, but only abnormal results are displayed) Labs Reviewed - No data to display  EKG None  Radiology DG Finger Thumb Left  Result Date: 06/12/2021 CLINICAL DATA:  Cut tip of left thumb today with saw, rule out open fracture EXAM: LEFT THUMB 2+V COMPARISON:  None. FINDINGS: There is no evidence of fracture or dislocation. Degenerative changes, most prominent at the first [facility] joint. No foreign body. IMPRESSION: Negative for fracture or foreign body. Electronically Signed   By: Merilyn Baba M.D.   On: 06/12/2021 14:04    Procedures .Marland KitchenLaceration Repair  Date/Time: 06/12/2021 5:57 PM Performed by: Anselmo Pickler, PA-C Authorized by: Anselmo Pickler, PA-C   Consent:    Consent obtained:  Verbal   Consent given by:  Patient   Risks discussed:  Infection, pain and poor wound healing Universal protocol:    Patient identity confirmed:  Verbally with patient Anesthesia:    Anesthesia method:  Nerve block and local infiltration   Local anesthetic:  Lidocaine 1% w/o epi   Block location:  Left thumb   Block needle gauge:  25 G   Block anesthetic:  Lidocaine 1% w/o epi   Block technique:  Ring block   Block injection procedure:  Anatomic landmarks identified, introduced needle and negative aspiration for blood   Block outcome:  Incomplete block Laceration details:    Length (cm):  3 (including nail bed)   Depth (mm):  3 Exploration:    Contaminated: no   Treatment:    Area cleansed with:  Shur-Clens   Amount of cleaning:  Standard Skin repair:    Repair method:  Sutures   Suture size:  5-0   Suture material:  Prolene   Suture technique:  Simple interrupted   Number of sutures:  3 Approximation:    Approximation:  Close Repair type:     Repair type:  Simple Post-procedure details:    Dressing:  Antibiotic ointment and non-adherent dressing   Procedure completion:  Tolerated   Medications Ordered in ED Medications  cephALEXin (KEFLEX) capsule 500 mg (has no administration in time range)  bacitracin ointment (has no administration in time range)  Tdap (BOOSTRIX) injection 0.5 mL (0.5 mLs Intramuscular Given 06/12/21 1704)  lidocaine (PF) (XYLOCAINE) 1 %  injection 10 mL (10 mLs Infiltration Given by Other 06/12/21 1751)    ED Course  I have reviewed the triage vital signs and the nursing notes.  Pertinent labs & imaging results that were available during my care of the patient were reviewed by me and considered in my medical decision making (see chart for details).    MDM Rules/Calculators/A&P                         I discussed this case with my attending physician who cosigned this note including patient's presenting symptoms, physical exam, and planned diagnostics and interventions. Attending physician stated agreement with plan or made changes to plan which were implemented.   Attending physician assessed patient at bedside.  Left thumb is neurovascularly intact.  Intact capillary refill distal to the injury.  Wound was repaired as described above.  No attempt to avulsed the affected nail bed was attempted.  Discussed that patient may lose some of her nail, or it may take several months to grow out.  Because we are unable to repair the nail with sutures, do recommend short course of antibiotics.  Patient prescribed Keflex.  We updated patient's tetanus.  Wound bandaged with bacitracin, nonadherent dressing.  Patient instructed to return for suture removal in 10 to 14 days.  Return precautions given.  Patient discharged in stable condition. Final Clinical Impression(s) / ED Diagnoses Final diagnoses:  Laceration of left thumb without foreign body with damage to nail, initial encounter    Rx / DC Orders ED Discharge  Orders          Ordered    cephALEXin (KEFLEX) 500 MG capsule  2 times daily        06/12/21 1746             Keidra Withers, Mayetta H, PA-C 06/12/21 1759    Malvin Johns, MD 06/12/21 2353

## 2021-07-06 ENCOUNTER — Telehealth: Payer: Self-pay | Admitting: Family Medicine

## 2021-07-06 DIAGNOSIS — E2839 Other primary ovarian failure: Secondary | ICD-10-CM

## 2021-07-06 NOTE — Telephone Encounter (Signed)
Pt daughter called stated pt needs orders fax to [facility] for bone density test that is schedule for 07/11/21 at 1:00 pm  fax # [phone]  . Would like a call back  # [phone]

## 2021-07-07 NOTE — Telephone Encounter (Signed)
Order is in to release and fax Thanks

## 2021-07-09 NOTE — Telephone Encounter (Signed)
Order printed but PCP has to sign it before I can fax it. Orders placed in PCP's inbox to sign when she returns tomorrow

## 2021-07-10 NOTE — Telephone Encounter (Signed)
Order faxed to [facility] and left VM letting pt's daughter know order was faxed

## 2021-07-10 NOTE — Telephone Encounter (Signed)
Signed and in IN box 

## 2021-07-11 DIAGNOSIS — M85852 Other specified disorders of bone density and structure, left thigh: Secondary | ICD-10-CM | POA: Diagnosis not present

## 2021-07-11 DIAGNOSIS — M810 Age-related osteoporosis without current pathological fracture: Secondary | ICD-10-CM | POA: Diagnosis not present

## 2021-07-11 DIAGNOSIS — M85851 Other specified disorders of bone density and structure, right thigh: Secondary | ICD-10-CM | POA: Diagnosis not present

## 2021-07-11 DIAGNOSIS — N184 Chronic kidney disease, stage 4 (severe): Secondary | ICD-10-CM | POA: Diagnosis not present

## 2021-07-11 LAB — HM DEXA SCAN

## 2021-07-18 ENCOUNTER — Encounter: Payer: Self-pay | Admitting: Family Medicine

## 2021-07-20 DIAGNOSIS — D631 Anemia in chronic kidney disease: Secondary | ICD-10-CM | POA: Diagnosis not present

## 2021-07-20 DIAGNOSIS — E785 Hyperlipidemia, unspecified: Secondary | ICD-10-CM | POA: Diagnosis not present

## 2021-07-20 DIAGNOSIS — N184 Chronic kidney disease, stage 4 (severe): Secondary | ICD-10-CM | POA: Diagnosis not present

## 2021-07-20 DIAGNOSIS — N2581 Secondary hyperparathyroidism of renal origin: Secondary | ICD-10-CM | POA: Diagnosis not present

## 2021-07-20 DIAGNOSIS — I129 Hypertensive chronic kidney disease with stage 1 through stage 4 chronic kidney disease, or unspecified chronic kidney disease: Secondary | ICD-10-CM | POA: Diagnosis not present

## 2021-07-25 ENCOUNTER — Encounter: Payer: Self-pay | Admitting: Family Medicine

## 2021-09-05 DIAGNOSIS — H0100A Unspecified blepharitis right eye, upper and lower eyelids: Secondary | ICD-10-CM | POA: Diagnosis not present

## 2021-09-05 DIAGNOSIS — H04123 Dry eye syndrome of bilateral lacrimal glands: Secondary | ICD-10-CM | POA: Diagnosis not present

## 2021-09-05 DIAGNOSIS — H0100B Unspecified blepharitis left eye, upper and lower eyelids: Secondary | ICD-10-CM | POA: Diagnosis not present

## 2021-09-09 DIAGNOSIS — U071 COVID-19: Secondary | ICD-10-CM

## 2021-09-09 HISTORY — DX: COVID-19: U07.1

## 2021-09-13 DIAGNOSIS — H25013 Cortical age-related cataract, bilateral: Secondary | ICD-10-CM | POA: Diagnosis not present

## 2021-09-13 DIAGNOSIS — H04123 Dry eye syndrome of bilateral lacrimal glands: Secondary | ICD-10-CM | POA: Diagnosis not present

## 2021-09-13 DIAGNOSIS — H524 Presbyopia: Secondary | ICD-10-CM | POA: Diagnosis not present

## 2021-09-13 DIAGNOSIS — H2513 Age-related nuclear cataract, bilateral: Secondary | ICD-10-CM | POA: Diagnosis not present

## 2021-09-14 ENCOUNTER — Telehealth: Payer: Medicare PPO | Admitting: Physician Assistant

## 2021-09-14 DIAGNOSIS — U071 COVID-19: Secondary | ICD-10-CM

## 2021-09-14 MED ORDER — MOLNUPIRAVIR EUA 200MG CAPSULE: 4.0000 | ORAL_CAPSULE | Freq: Two times a day (BID) | ORAL | 0 refills | Status: AC

## 2021-09-14 MED ORDER — BENZONATATE 100 MG PO CAPS
100.0000 mg | ORAL_CAPSULE | Freq: Three times a day (TID) | ORAL | 0 refills | Status: DC | PRN
Start: 2021-09-14 — End: 2021-11-05

## 2021-09-14 NOTE — Progress Notes (Signed)
Virtual Visit Consent   Katherine Brooks, you are scheduled for a virtual visit with a [facility] provider today.     Just as with appointments in the office, your consent must be obtained to participate.  Your consent will be active for this visit and any virtual visit you may have with one of our providers in the next 365 days.     If you have a MyChart account, a copy of this consent can be sent to you electronically.  All virtual visits are billed to your insurance company just like a traditional visit in the office.    As this is a virtual visit, video technology does not allow for your provider to perform a traditional examination.  This may limit your provider's ability to fully assess your condition.  If your provider identifies any concerns that need to be evaluated in person or the need to arrange testing (such as labs, EKG, etc.), we will make arrangements to do so.     Although advances in technology are sophisticated, we cannot ensure that it will always work on either your end or our end.  If the connection with a video visit is poor, the visit may have to be switched to a telephone visit.  With either a video or telephone visit, we are not always able to ensure that we have a secure connection.     I need to obtain your verbal consent now.   Are you willing to proceed with your visit today?    Katherine Brooks has provided verbal consent on 09/14/2021 for a virtual visit (video or telephone).   Mar Daring, PA-C   Date: 09/14/2021 1:51 PM   Virtual Visit via Video Note   I, Mar Daring, connected with  GARYN WAGUESPACK  ([id number], 1936/07/27) on 09/14/21 at  1:45 PM EST by a video-enabled telemedicine application and verified that I am speaking with the correct person using two identifiers.  Location: Patient: Virtual Visit Location Patient: Home Provider: Virtual Visit Location Provider: Home Office   I discussed the limitations of evaluation and management by  telemedicine and the availability of in person appointments. The patient expressed understanding and agreed to proceed.    History of Present Illness: Katherine Brooks is a 86 y.o. who identifies as a female who was assigned female at birth, and is being seen today for Covid 14.  HPI: URI  This is a new problem. Episode onset: Tested positive for Covid 19; symptoms started 2 days ago. The problem has been gradually worsening. There has been no fever. Associated symptoms include congestion, coughing, headaches and rhinorrhea. Pertinent negatives include no diarrhea, ear pain, nausea, plugged ear sensation, sinus pain, sore throat or vomiting. Associated symptoms comments: Myalgias, chills, post nasal drainage. She has tried acetaminophen, sleep and increased fluids (mucinex, zyrtec) for the symptoms. The treatment provided no relief.     Problems:  Patient Active Problem List   Diagnosis Date Noted   Estrogen deficiency 02/26/2021   Chronic kidney disease (CKD) stage G4/A1, severely decreased glomerular filtration rate (GFR) between 15-29 mL/min/1.73 square meter and albuminuria creatinine ratio less than 30 mg/g ([place]) 10/30/2020   Dysgeusia 08/30/2020   Obesity (BMI 35.0-39.9 without comorbidity) 03/20/2018   Hypokalemia 03/15/2018   Renal insufficiency, mild 02/18/2011   Osteoporosis 02/06/2011   Primary hypertension 10/17/2010   Hyperlipidemia 10/17/2010    Allergies:  Allergies  Allergen Reactions   Lisinopril Other (See Comments)    "passed  out"   Carbamazepine Rash   Medications:  Current Outpatient Medications:    benzonatate (TESSALON) 100 MG capsule, Take 1 capsule (100 mg total) by mouth 3 (three) times daily as needed., Disp: 30 capsule, Rfl: 0   molnupiravir EUA (LAGEVRIO) 200 mg CAPS capsule, Take 4 capsules (800 mg total) by mouth 2 (two) times daily for 5 days., Disp: 40 capsule, Rfl: 0   amLODipine (NORVASC) 5 MG tablet, Take 1 tablet (5 mg total) by mouth daily., Disp:  90 tablet, Rfl: 3   Ascorbic Acid (VITAMIN C) 1000 MG tablet, Take 1,000 mg by mouth daily., Disp: , Rfl:    cephALEXin (KEFLEX) 500 MG capsule, Take 1 capsule (500 mg total) by mouth 2 (two) times daily., Disp: 14 capsule, Rfl: 0   Cholecalciferol (VITAMIN D3) 50 MCG (2000 UT) TABS, Take 2,000 Units by mouth daily., Disp: , Rfl:    MAGNESIUM PO, Take 5 mg by mouth daily., Disp: , Rfl:    Multiple Vitamins-Minerals (WOMENS 50+ MULTI VITAMIN/MIN PO), Take 1 capsule by mouth daily., Disp: , Rfl:   Observations/Objective: Patient is well-developed, well-nourished in no acute distress.  Resting comfortably at home.  Head is normocephalic, atraumatic.  No labored breathing.  Speech is clear and coherent with logical content.  Patient is alert and oriented at baseline.    Assessment and Plan: 1. COVID-19 - MyChart COVID-19 home monitoring program; Future - molnupiravir EUA (LAGEVRIO) 200 mg CAPS capsule; Take 4 capsules (800 mg total) by mouth 2 (two) times daily for 5 days.  Dispense: 40 capsule; Refill: 0 - benzonatate (TESSALON) 100 MG capsule; Take 1 capsule (100 mg total) by mouth 3 (three) times daily as needed.  Dispense: 30 capsule; Refill: 0  - Continue OTC symptomatic management of choice - Will send OTC vitamins and supplement information through AVS - Molnupiravir and tessalon prescribed - Patient enrolled in MyChart symptom monitoring - Push fluids - Rest as needed - Discussed return precautions and when to seek in-person evaluation, sent via AVS as well  Follow Up Instructions: I discussed the assessment and treatment plan with the patient. The patient was provided an opportunity to ask questions and all were answered. The patient agreed with the plan and demonstrated an understanding of the instructions.  A copy of instructions were sent to the patient via MyChart unless otherwise noted below.   The patient was advised to call back or seek an in-person evaluation if the  symptoms worsen or if the condition fails to improve as anticipated.  Time:  I spent 10 minutes with the patient via telehealth technology discussing the above problems/concerns.    Mar Daring, PA-C

## 2021-09-14 NOTE — Patient Instructions (Signed)
[name], thank you for joining Mar Daring, PA-C for today's virtual visit.  While this provider is not your primary care provider (PCP), if your PCP is located in our provider database this encounter information will be shared with them immediately following your visit.  Consent: (Patient) Katherine Brooks provided verbal consent for this virtual visit at the beginning of the encounter.  Current Medications:  Current Outpatient Medications:    benzonatate (TESSALON) 100 MG capsule, Take 1 capsule (100 mg total) by mouth 3 (three) times daily as needed., Disp: 30 capsule, Rfl: 0   molnupiravir EUA (LAGEVRIO) 200 mg CAPS capsule, Take 4 capsules (800 mg total) by mouth 2 (two) times daily for 5 days., Disp: 40 capsule, Rfl: 0   amLODipine (NORVASC) 5 MG tablet, Take 1 tablet (5 mg total) by mouth daily., Disp: 90 tablet, Rfl: 3   Ascorbic Acid (VITAMIN C) 1000 MG tablet, Take 1,000 mg by mouth daily., Disp: , Rfl:    cephALEXin (KEFLEX) 500 MG capsule, Take 1 capsule (500 mg total) by mouth 2 (two) times daily., Disp: 14 capsule, Rfl: 0   Cholecalciferol (VITAMIN D3) 50 MCG (2000 UT) TABS, Take 2,000 Units by mouth daily., Disp: , Rfl:    MAGNESIUM PO, Take 5 mg by mouth daily., Disp: , Rfl:    Multiple Vitamins-Minerals (WOMENS 50+ MULTI VITAMIN/MIN PO), Take 1 capsule by mouth daily., Disp: , Rfl:    Medications ordered in this encounter:  Meds ordered this encounter  Medications   molnupiravir EUA (LAGEVRIO) 200 mg CAPS capsule    Sig: Take 4 capsules (800 mg total) by mouth 2 (two) times daily for 5 days.    Dispense:  40 capsule    Refill:  0    Order Specific Question:   Supervising Provider    Answer:   MILLER, BRIAN [[id number]]   benzonatate (TESSALON) 100 MG capsule    Sig: Take 1 capsule (100 mg total) by mouth 3 (three) times daily as needed.    Dispense:  30 capsule    Refill:  0    Order Specific Question:   Supervising Provider    Answer:   Sabra Heck, [place]      *If you need refills on other medications prior to your next appointment, please contact your pharmacy*  Follow-Up: Call back or seek an in-person evaluation if the symptoms worsen or if the condition fails to improve as anticipated.  Other Instructions Molnupiravir Oral Capsules What is this medication? MOLNUPIRAVIR (mol nue pir a vir) treats COVID-19. It is an antiviral medication. It may decrease the risk of developing severe symptoms of COVID-19. It may also decrease the chance of going to the hospital. This medication is not approved by the FDA. The FDA has authorized emergency use of this medication during the COVID-19 pandemic. This medicine may be used for other purposes; ask your health care provider or pharmacist if you have questions. COMMON BRAND NAME(S): LAGEVRIO What should I tell my care team before I take this medication? They need to know if you have any of these conditions: Any allergies Any serious illness An unusual or allergic reaction to molnupiravir, other medications, foods, dyes, or preservatives Pregnant or trying to get pregnant Breast-feeding How should I use this medication? Take this medication by mouth with water. Take it as directed on the prescription label at the same time every day. Do not cut, crush or chew this medication. Swallow the capsules whole. You can take it  with or without food. If it upsets your stomach, take it with food. Take all of this medication unless your care team tells you to stop it early. Keep taking it even if you think you are better. Talk to your care team about the use of this medication in children. Special care may be needed. Overdosage: If you think you have taken too much of this medicine contact a poison control center or emergency room at once. NOTE: This medicine is only for you. Do not share this medicine with others. What if I miss a dose? If you miss a dose, take it as soon as you can unless it is more than 10 hours  late. If it is more than 10 hours late, skip the missed dose. Take the next dose at the normal time. Do not take extra or 2 doses at the same time to make up for the missed dose. What may interact with this medication? Interactions have not been studied. This list may not describe all possible interactions. Give your health care provider a list of all the medicines, herbs, non-prescription drugs, or dietary supplements you use. Also tell them if you smoke, drink alcohol, or use illegal drugs. Some items may interact with your medicine. What should I watch for while using this medication? Your condition will be monitored carefully while you are receiving this medication. Visit your care team for regular checkups. Tell your care team if your symptoms do not start to get better or if they get worse. Do not become pregnant while taking this medication. You may need a pregnancy test before starting this medication. Women must use a reliable form of birth control while taking this medication and for 4 days after stopping the medication. Women should inform their care team if they wish to become pregnant or think they might be pregnant. Men should not father a child while taking this medication and for 3 months after stopping it. There is potential for serious harm to an unborn child. Talk to your care team for more information. Do not breast-feed an infant while taking this medication and for 4 days after stopping the medication. What side effects may I notice from receiving this medication? Side effects that you should report to your care team as soon as possible: Allergic reactions--skin rash, itching, hives, swelling of the face, lips, tongue, or throat Side effects that usually do not require medical attention (report these to your care team if they continue or are bothersome): Diarrhea Dizziness Nausea This list may not describe all possible side effects. Call your doctor for medical advice about side  effects. You may report side effects to FDA at 1-800-FDA-1088. Where should I keep my medication? Keep out of the reach of children and pets. Store at room temperature between 20 and 25 degrees C (68 and 77 degrees F). Get rid of any unused medication after the expiration date. To get rid of medications that are no longer needed or have expired: Take the medication to a medication take-back program. Check with your pharmacy or law enforcement to find a location. If you cannot return the medication, check the label or package insert to see if the medication should be thrown out in the garbage or flushed down the toilet. If you are not sure, ask your care team. If it is safe to put it in the trash, take the medication out of the container. Mix the medication with cat litter, dirt, coffee grounds, or other unwanted substance. Seal the  mixture in a bag or container. Put it in the trash. NOTE: This sheet is a summary. It may not cover all possible information. If you have questions about this medicine, talk to your doctor, pharmacist, or health care provider.  2022 Elsevier/Gold Standard (2020-09-04 00:00:00)   10 Things You Can Do to Manage Your COVID-19 Symptoms at Home If you have possible or confirmed COVID-19 Stay home except to get medical care. Monitor your symptoms carefully. If your symptoms get worse, call your healthcare provider immediately. Get rest and stay hydrated. If you have a medical appointment, call the healthcare provider ahead of time and tell them that you have or may have COVID-19. For medical emergencies, call 911 and notify the dispatch personnel that you have or may have COVID-19. Cover your cough and sneezes with a tissue or use the inside of your elbow. Wash your hands often with soap and water for at least 20 seconds or clean your hands with an alcohol-based hand sanitizer that contains at least 60% alcohol. As much as possible, stay in a specific room and away from  other people in your home. Also, you should use a separate bathroom, if available. If you need to be around other people in or outside of the home, wear a mask. Avoid sharing personal items with other people in your household, like dishes, towels, and bedding. Clean all surfaces that are touched often, like counters, tabletops, and doorknobs. Use household cleaning sprays or wipes according to the label instructions. [email or web address] 03/24/2020 This information is not intended to replace advice given to you by your health care provider. Make sure you discuss any questions you have with your health care provider. Document Revised: 05/18/2021 Document Reviewed: 05/18/2021 Elsevier Patient Education  2022 [employer].    If you have been instructed to have an in-person evaluation today at a local Urgent Care facility, please use the link below. It will take you to a list of all of our available [facility], including address, phone number and hours of operation. Please do not delay care.  [facility]  If you or a family member do not have a primary care provider, use the link below to schedule a visit and establish care. When you choose a [facility] primary care physician or advanced practice provider, you gain a long-term partner in health. Find a Primary Care Provider  Learn more about [facility]'s in-office and virtual care options: [facility]

## 2021-10-25 ENCOUNTER — Telehealth: Payer: Self-pay | Admitting: Family Medicine

## 2021-10-25 NOTE — Telephone Encounter (Signed)
I called pt daughter and let her know

## 2021-10-25 NOTE — Telephone Encounter (Signed)
Pt daughter called wanting to change provider. Pt daughter said she knew Tullo when she was doing residency at [facility]. Pt daughter wants to know if Tullo would take mom on as new patient. Pt daughter said her name was Leonides Grills back then when Derrel Nip was doing her residency at [facility] [employer]

## 2021-10-25 NOTE — Telephone Encounter (Signed)
I'm not sure if she is taking new patients but that is ok with me

## 2021-11-05 ENCOUNTER — Ambulatory Visit
Admission: RE | Admit: 2021-11-05 | Discharge: 2021-11-05 | Disposition: A | Discharge status: Home / Self Care | Payer: Medicare PPO | Source: Ambulatory Visit | Attending: Family | Admitting: Family

## 2021-11-05 ENCOUNTER — Encounter: Payer: Self-pay | Admitting: Family

## 2021-11-05 ENCOUNTER — Ambulatory Visit (INDEPENDENT_AMBULATORY_CARE_PROVIDER_SITE_OTHER)
Admission: RE | Admit: 2021-11-05 | Discharge: 2021-11-05 | Disposition: A | Discharge status: Home / Self Care | Payer: Medicare PPO | Source: Ambulatory Visit | Attending: Family | Admitting: Family

## 2021-11-05 ENCOUNTER — Other Ambulatory Visit: Payer: Self-pay

## 2021-11-05 ENCOUNTER — Ambulatory Visit: Payer: Medicare PPO | Admitting: Family

## 2021-11-05 VITALS — BP 124/86 | HR 80 | Ht 60.0 in | Wt 187.0 lb

## 2021-11-05 DIAGNOSIS — I447 Left bundle-branch block, unspecified: Secondary | ICD-10-CM

## 2021-11-05 DIAGNOSIS — R600 Localized edema: Secondary | ICD-10-CM

## 2021-11-05 DIAGNOSIS — R0609 Other forms of dyspnea: Secondary | ICD-10-CM | POA: Diagnosis not present

## 2021-11-05 DIAGNOSIS — J309 Allergic rhinitis, unspecified: Secondary | ICD-10-CM

## 2021-11-05 DIAGNOSIS — R5383 Other fatigue: Secondary | ICD-10-CM

## 2021-11-05 DIAGNOSIS — I10 Essential (primary) hypertension: Secondary | ICD-10-CM

## 2021-11-05 DIAGNOSIS — J849 Interstitial pulmonary disease, unspecified: Secondary | ICD-10-CM | POA: Diagnosis not present

## 2021-11-05 DIAGNOSIS — E876 Hypokalemia: Secondary | ICD-10-CM

## 2021-11-05 DIAGNOSIS — J90 Pleural effusion, not elsewhere classified: Secondary | ICD-10-CM | POA: Diagnosis not present

## 2021-11-05 LAB — CBC WITH DIFFERENTIAL/PLATELET
Basophils Absolute: 0.1 10*3/uL (ref 0.0–0.1)
Basophils Relative: 0.8 % (ref 0.0–3.0)
Eosinophils Absolute: 0.6 10*3/uL (ref 0.0–0.7)
Eosinophils Relative: 5.5 % — ABNORMAL HIGH (ref 0.0–5.0)
HCT: 36.1 % (ref 36.0–46.0)
Hemoglobin: 11.5 g/dL — ABNORMAL LOW (ref 12.0–15.0)
Lymphocytes Relative: 29.5 % (ref 12.0–46.0)
Lymphs Abs: 3.5 10*3/uL (ref 0.7–4.0)
MCHC: 31.9 g/dL (ref 30.0–36.0)
MCV: 87.7 fl (ref 78.0–100.0)
Monocytes Absolute: 1 10*3/uL (ref 0.1–1.0)
Monocytes Relative: 8.7 % (ref 3.0–12.0)
Neutro Abs: 6.5 10*3/uL (ref 1.4–7.7)
Neutrophils Relative %: 55.5 % (ref 43.0–77.0)
Platelets: 322 10*3/uL (ref 150.0–400.0)
RBC: 4.12 Mil/uL (ref 3.87–5.11)
RDW: 15.5 % (ref 11.5–15.5)
WBC: 11.7 10*3/uL — ABNORMAL HIGH (ref 4.0–10.5)

## 2021-11-05 LAB — BASIC METABOLIC PANEL
BUN: 18 mg/dL (ref 6–23)
CO2: 27 mEq/L (ref 19–32)
Calcium: 9.6 mg/dL (ref 8.4–10.5)
Chloride: 106 mEq/L (ref 96–112)
Creatinine, Ser: 1.9 mg/dL — ABNORMAL HIGH (ref 0.40–1.20)
GFR: 24.06 mL/min — ABNORMAL LOW (ref >60.00)
Glucose, Bld: 89 mg/dL (ref 70–99)
Potassium: 5.3 mEq/L — ABNORMAL HIGH (ref 3.5–5.1)
Sodium: 139 mEq/L (ref 135–145)

## 2021-11-05 MED ORDER — MONTELUKAST SODIUM 10 MG PO TABS: 10.0000 mg | ORAL_TABLET | Freq: Every day | ORAL | 3 refills | Status: DC

## 2021-11-05 MED ORDER — FUROSEMIDE 20 MG PO TABS: 20.0000 mg | ORAL_TABLET | Freq: Every day | ORAL | 0 refills | Status: DC

## 2021-11-05 MED ORDER — LABETALOL HCL 100 MG PO TABS: ORAL_TABLET | ORAL | 0 refills | Status: DC

## 2021-11-05 NOTE — Progress Notes (Signed)
Established Patient Office Visit  Subjective:  Patient ID: Katherine Brooks, female    DOB: May 15, 1936  Age: 86 y.o. MRN: [id number]  CC:  Chief Complaint  Patient presents with   New Patient (Initial Visit)    Coughing, wheezing, --10 days Denied fever    HPI Katherine Brooks is here today for follow up.  Pt is without acute concerns.  Noticing an irritating dry non productive cough that is worse at night. Notices when she exhales she can hear a wheeze. Tongue has been scratchy and feels 'like popcorn kernals' on her throat. She did take her daughters Singulair with noted improvement as also taking zyrtec at night. This has helped slightly but not completely. No known h/o COPD or asthma, but was around a lot of second hand smoking growing up. No sob at rest, but mainly with DOE/ noted in the last one month.   Covid was back in January, she improved slightly but not completely (notably fatigue, decreased energy)  Did also change from amlodipine 5 mg once daily to labetolol 100 mg 1 tablet bid with her nephrologist. D/c amlodipine due to pedal edema which did improve slightly after d/c. Has noticed increased swelling in the last few weeks come back, in the last month or so? Has checked weight in the past but not recently. Did not increased fatigue with labetolol , back 2/23 blood pressure dropped so has bene taking BB 1/2 tablet twice daily instead and avg blood pressure is now 120/75 or so.   Past Medical History:  Diagnosis Date   Allergy    Cancer ([place])    skin   Facial pain    Hypertension     Past Surgical History:  Procedure Laterality Date   salivary gland N/A 12/2012    Family History  Problem Relation Age of Onset   Heart disease Mother    Cancer Sister    COPD Brother     Social History   Socioeconomic History   Marital status: Married    Spouse name: Joneen Boers   Number of children: 2   Years of education: Not on file   Highest education level: Some  college, no degree  Occupational History    Comment: Network engineer, school system  Tobacco Use   Smoking status: Never   Smokeless tobacco: Never  Vaping Use   Vaping Use: Never used  Substance and Sexual Activity   Alcohol use: No   Drug use: No   Sexual activity: Yes  Other Topics Concern   Not on file  Social History Narrative   Lives with husband   Social Determinants of Health   Financial Resource Strain: Low Risk    Difficulty of Paying Living Expenses: Not hard at all  Food Insecurity: No Food Insecurity   Worried About Charity fundraiser in the Last Year: Never true   [place] in the Last Year: Never true  Transportation Needs: No Transportation Needs   Lack of Transportation (Medical): No   Lack of Transportation (Non-Medical): No  Physical Activity: Inactive   Days of Exercise per Week: 0 days   Minutes of Exercise per Session: 0 min  Stress: No Stress Concern Present   Feeling of Stress : Not at all  Social Connections: Not on file  Intimate Partner Violence: Not At Risk   Fear of Current or Ex-Partner: No   Emotionally Abused: No   Physically Abused: No   Sexually Abused: No  Outpatient Medications Prior to Visit  Medication Sig Dispense Refill   Ascorbic Acid (VITAMIN C) 1000 MG tablet      Biotin 10000 MCG TABS Take by mouth.     Calcium Carbonate-Vit D-Min (CALCIUM 1200 PO) Take by mouth.     Cholecalciferol (VITAMIN D3) 50 MCG (2000 UT) TABS Take 2,000 Units by mouth daily.     MAGNESIUM PO Take 5 mg by mouth daily.     Multiple Vitamins-Minerals (WOMENS 50+ MULTI VITAMIN/MIN PO) Take 1 capsule by mouth daily.     amLODipine (NORVASC) 5 MG tablet Take 1 tablet (5 mg total) by mouth daily. 90 tablet 3   Ascorbic Acid (VITAMIN C) 1000 MG tablet Take 1,000 mg by mouth daily.     benzonatate (TESSALON) 100 MG capsule Take 1 capsule (100 mg total) by mouth 3 (three) times daily as needed. 30 capsule 0   cephALEXin  (KEFLEX) 500 MG capsule Take 1 capsule (500 mg total) by mouth 2 (two) times daily. 14 capsule 0   No facility-administered medications prior to visit.    Allergies  Allergen Reactions   Lisinopril Other (See Comments)    "passed out"   Carbamazepine Rash    ROS Review of Systems  Constitutional:  Positive for fatigue. Negative for chills and fever. Unexpected weight change: unsure hasn't been weighing. HENT:  Negative for congestion, ear pain, sinus pressure and sore throat.   Respiratory:  Positive for shortness of breath (DOE) and wheezing. Negative for cough and chest tightness.   Cardiovascular:  Positive for leg swelling. Negative for chest pain and palpitations.  Genitourinary:  Negative for difficulty urinating, frequency and hematuria.      Objective:    Physical Exam  Gen: NAD, resting comfortably CV: RRR with no murmurs appreciated Pulm: NWOB, CTAB with no crackles, wheezes, or rhonchi GI: Normal bowel sounds present. Soft, Nontender, Nondistended. Bil pitting 3+ edema ankle to foot Skin: warm, dry Psych: Normal affect and thought content  BP 124/86    Pulse 80    Ht 5' (1.524 m)    Wt 187 lb (84.8 kg)    SpO2 94%    BMI 36.52 kg/m  Wt Readings from Last 3 Encounters:  11/06/21 179 lb 2 oz (81.3 kg)  11/05/21 187 lb (84.8 kg)  06/12/21 177 lb 14.6 oz (80.7 kg)     Health Maintenance Due  Topic Date Due   COVID-19 Vaccine (3 - Booster for Pfizer series) 12/06/2019    There are no preventive care reminders to display for this patient.  Lab Results  Component Value Date   TSH 3.19 11/05/2021   Lab Results  Component Value Date   WBC 11.7 (H) 11/05/2021   HGB 11.5 (L) 11/05/2021   HCT 36.1 11/05/2021   MCV 87.7 11/05/2021   PLT 322.0 11/05/2021   Lab Results  Component Value Date   NA 139 11/05/2021   K 5.3 No hemolysis seen (H) 11/05/2021   CO2 27 11/05/2021   GLUCOSE 89 11/05/2021   BUN 18 11/05/2021   CREATININE 1.90 (H) 11/05/2021    BILITOT 0.5 02/26/2021   ALKPHOS 58 02/26/2021   AST 18 02/26/2021   ALT 11 02/26/2021   PROT 6.6 02/26/2021   ALBUMIN 4.1 02/26/2021   CALCIUM 9.6 11/05/2021   ANIONGAP 8 03/17/2018   GFR 24.06 (L) 11/05/2021   Lab Results  Component Value Date   CHOL 197 02/26/2021   Lab Results  Component Value Date   HDL  63.20 02/26/2021   Lab Results  Component Value Date   LDLCALC 119 (H) 02/26/2021   Lab Results  Component Value Date   TRIG 76.0 02/26/2021   Lab Results  Component Value Date   CHOLHDL 3 02/26/2021   Lab Results  Component Value Date   HGBA1C 5.6 06/24/2018      Assessment & Plan:   Problem List Items Addressed This Visit       Cardiovascular and Mediastinum   Primary hypertension - Primary    Experiencing more hypotension at times.  D/c labetolol on low dose currrently no need to taper        Relevant Medications   furosemide (LASIX) 20 MG tablet   Other Relevant Orders   EKG 12-Lead (Completed)   Left bundle branch block    Found on ekg.  Pt symptomatic.  If any cp worsening sob immediately to ER.  Refer cardiologist for eval/treat Suspected underlying CHF, w/u in progress      Relevant Medications   furosemide (LASIX) 20 MG tablet   Other Relevant Orders   ECHOCARDIOGRAM COMPLETE (Completed)   Ambulatory referral to Cardiology     Respiratory   Allergic rhinitis    Sending pt her own rx for singulair as taking daughters  Relief with some of sob/post nasal drip        Other   Hypokalemia    Order bmp       DOE (dyspnea on exertion)    cxr and echocardiogram ordered CHF suspected Also ordering Pro BNP stat for w/u CBC to r/o infection  ekg in office today with LBB finding  extensive time spend in exam room with diagnostic testing, review of old notes and labs, concerning phyiscal exam and review of acute symptoms in detail to total 53  minutes      Relevant Medications   montelukast (SINGULAIR) 10 MG tablet   Other  Relevant Orders   EKG 12-Lead (Completed)   ECHOCARDIOGRAM COMPLETE (Completed)   Ambulatory referral to Cardiology   DG Chest 2 View (Completed)   Brain natriuretic peptide (Completed)   CBC with Differential (Completed)   Basic metabolic panel (Completed)   Pedal edema    Lower extremity edema, 3+ bil pitting, concerned for CHF. Will r/o out this as ddx. Ordering CMP today , BNP  Starting pt on lasix 20 mg once daily, will hold on potassium.       Relevant Medications   montelukast (SINGULAIR) 10 MG tablet   furosemide (LASIX) 20 MG tablet   Other Relevant Orders   ECHOCARDIOGRAM COMPLETE (Completed)   Ambulatory referral to Cardiology   DG Chest 2 View (Completed)   Brain natriuretic peptide (Completed)   CBC with Differential (Completed)   Basic metabolic panel (Completed)   Other fatigue    Cbc tsh and b12 ordered pending results Bmp ordered also pending D/c labetolol as may worsen fatigue      Relevant Orders   TSH (Completed)   B12 and Folate Panel (Completed)   ECHOCARDIOGRAM COMPLETE (Completed)   Ambulatory referral to Cardiology   DG Chest 2 View (Completed)   Brain natriuretic peptide (Completed)   CBC with Differential (Completed)   Basic metabolic panel (Completed)    Meds ordered this encounter  Medications   DISCONTD: labetalol (NORMODYNE) 100 MG tablet    Sig: Take 1/2 tablet po bid    Dispense:  90 tablet    Refill:  0    Order Specific Question:  Supervising Provider    Answer:   Diona Browner, AMY E [[id number]]   montelukast (SINGULAIR) 10 MG tablet    Sig: Take 1 tablet (10 mg total) by mouth at bedtime.    Dispense:  30 tablet    Refill:  3    Order Specific Question:   Supervising Provider    Answer:   BEDSOLE, AMY E [[id number]]   furosemide (LASIX) 20 MG tablet    Sig: Take 1 tablet (20 mg total) by mouth daily.    Dispense:  30 tablet    Refill:  0    Order Specific Question:   Supervising Provider    Answer:   Diona Browner, AMY E [[id number]]     Follow-up: Return in about 1 week (around 11/12/2021).    Eugenia Pancoast, FNP

## 2021-11-05 NOTE — Patient Instructions (Addendum)
Complete xray(s) prior to leaving today. I will notify you of your results once received.  .A referral was placed today for cardiologist.  Please let us know if you have not heard back within 1 week about your referral.  I have sent furosemide lasix to your pharmacy. Please start and track weight daily, if any increase in 2 pounds or great let me know immediately. If any worsening shortness of breath, chest pain, go to ER. Or call 911.   I have ordered imaging for you at [facility] outpatient [facility] for an echo-cardiogram. This order has been sent over for you electronically.   Please call [phone] to schedule this appointment.  It was a pleasure seeing you today! Please do not hesitate to reach out with any questions and or concerns.  Regards,   Eugenia Pancoast FNP-C

## 2021-11-05 NOTE — Progress Notes (Signed)
Likely CHF, pending Bnp and lab workup. Stat consult with cardiologist in place as well, please remind her to let us know if nobody reaches out in the next few days. Pt started on lasix which should help with fluid build up (PE) make sure she takes 20 mg once daily. . Have pt f/u one week with me and we will repeat kidney function as well.

## 2021-11-06 ENCOUNTER — Encounter: Payer: Self-pay | Admitting: Cardiovascular Disease

## 2021-11-06 ENCOUNTER — Telehealth: Payer: Self-pay | Admitting: Cardiovascular Disease

## 2021-11-06 ENCOUNTER — Ambulatory Visit: Payer: Medicare PPO | Admitting: Cardiovascular Disease

## 2021-11-06 VITALS — BP 120/60 | HR 80 | Ht 60.0 in | Wt 179.1 lb

## 2021-11-06 DIAGNOSIS — J90 Pleural effusion, not elsewhere classified: Secondary | ICD-10-CM

## 2021-11-06 DIAGNOSIS — N184 Chronic kidney disease, stage 4 (severe): Secondary | ICD-10-CM | POA: Diagnosis not present

## 2021-11-06 DIAGNOSIS — R600 Localized edema: Secondary | ICD-10-CM | POA: Diagnosis not present

## 2021-11-06 DIAGNOSIS — I447 Left bundle-branch block, unspecified: Secondary | ICD-10-CM | POA: Insufficient documentation

## 2021-11-06 DIAGNOSIS — U071 COVID-19: Secondary | ICD-10-CM

## 2021-11-06 DIAGNOSIS — R5383 Other fatigue: Secondary | ICD-10-CM | POA: Insufficient documentation

## 2021-11-06 DIAGNOSIS — I10 Essential (primary) hypertension: Secondary | ICD-10-CM

## 2021-11-06 DIAGNOSIS — R0609 Other forms of dyspnea: Secondary | ICD-10-CM | POA: Insufficient documentation

## 2021-11-06 LAB — BRAIN NATRIURETIC PEPTIDE: Pro B Natriuretic peptide (BNP): 884 pg/mL — ABNORMAL HIGH (ref 0.0–100.0)

## 2021-11-06 LAB — TSH: TSH: 3.19 u[IU]/mL (ref 0.35–5.50)

## 2021-11-06 LAB — B12 AND FOLATE PANEL
Folate: 22.5 ng/mL (ref >5.9)
Vitamin B-12: >1504 pg/mL — ABNORMAL HIGH (ref 211–911)

## 2021-11-06 NOTE — Telephone Encounter (Signed)
Attempted to schedule labs .  LMOV to call office.

## 2021-11-06 NOTE — Progress Notes (Signed)
Cardiology Office Note  Date:  11/06/2021   ID:  KAISLYN GULAS, DOB 06-01-1936, MRN [id number]  PCP:  Abner Greenspan, MD   Chief Complaint  Patient presents with   New Patient (Initial Visit)    Ref by Eugenia Pancoast, FNP for DOE, pedal edema, fatigue and LBBB. Medications reviewed by the patient verbally.     HPI:  Ms. Katherine Brooks is a 86 year old woman with past medical history of COVID January 2023 Hypertension Left bundle branch block Chronic kidney disease GFR 28, dr. Justin Mend nephrology Presenting by referral from Georgina Peer for dyspnea on exertion over the past month  Discussion concerning recent management of her hypertension and medication changes Previously on amlodipine This was changed by nephrology to labetalol 100 twice daily secondary to leg swelling Given concern for worsening shortness of breath, she weaned down on the pill to half, now has stopped the medication Was having some wheezing  Recently seen by primary care, lab work as below Lab work reviewed Creatinine 1.9 BUN 18, renal function worse Total cholesterol 197 Hemoglobin 11.5 LDL 119  Chest x-ray reviewed concerning for small pleural effusions, possible pulmonary edema Reports she has more shortness of breath on exertion, has leg swelling Has had a cough, PND/orthopnea  Only recently started on Lasix  EKG personally reviewed by myself on todays visit Normal sinus rhythm left bundle branch block rate 80 bpm   PMH:   has a past medical history of Allergy, Cancer ([place]), Facial pain, and Hypertension.  PSH:    Past Surgical History:  Procedure Laterality Date   salivary gland N/A 12/2012    Current Outpatient Medications  Medication Sig Dispense Refill   Ascorbic Acid (VITAMIN C) 1000 MG tablet      Biotin 10000 MCG TABS Take by mouth.     Calcium Carbonate-Vit D-Min (CALCIUM 1200 PO) Take by mouth.     Cholecalciferol (VITAMIN D3) 50 MCG (2000 UT) TABS Take 2,000 Units by mouth daily.      furosemide (LASIX) 20 MG tablet Take 1 tablet (20 mg total) by mouth daily. 30 tablet 0   MAGNESIUM PO Take 5 mg by mouth daily.     montelukast (SINGULAIR) 10 MG tablet Take 1 tablet (10 mg total) by mouth at bedtime. 30 tablet 3   Multiple Vitamins-Minerals (WOMENS 50+ MULTI VITAMIN/MIN PO) Take 1 capsule by mouth daily.     No current facility-administered medications for this visit.     Allergies:   Lisinopril and Carbamazepine   Social History:  The patient  reports that she has never smoked. She has never used smokeless tobacco. She reports that she does not drink alcohol and does not use drugs.   Family History:   family history includes COPD in her brother; Cancer in her sister; Heart disease in her mother.    Review of Systems: Review of Systems  Constitutional: Negative.   HENT: Negative.    Respiratory:  Positive for cough and shortness of breath.   Cardiovascular:  Positive for leg swelling.  Gastrointestinal: Negative.   Musculoskeletal: Negative.   Neurological: Negative.   Psychiatric/Behavioral: Negative.    All other systems reviewed and are negative.   PHYSICAL EXAM: VS:  BP 120/60 (BP Location: Right Arm, Patient Position: Sitting, Cuff Size: Normal)    Pulse 80    Ht 5' (1.524 m)    Wt 179 lb 2 oz (81.3 kg)    SpO2 98%    BMI 34.98 kg/m  , BMI  Body mass index is 34.98 kg/m. GEN: Well nourished, well developed, in no acute distress HEENT: normal Neck: no JVD, carotid bruits, or masses Cardiac: RRR; no murmurs, rubs, or gallops,no edema  Respiratory:  clear to auscultation bilaterally, normal work of breathing GI: soft, nontender, nondistended, + BS MS: no deformity or atrophy Skin: warm and dry, no rash Neuro:  Strength and sensation are intact Psych: euthymic mood, full affect   Recent Labs: 02/26/2021: ALT 11 11/05/2021: BUN 18; Creatinine, Ser 1.90; Hemoglobin 11.5; Platelets 322.0; Potassium 5.3 No hemolysis seen; Pro B Natriuretic peptide (BNP)  884.0; Sodium 139; TSH 3.19    Lipid Panel Lab Results  Component Value Date   CHOL 197 02/26/2021   HDL 63.20 02/26/2021   LDLCALC 119 (H) 02/26/2021   TRIG 76.0 02/26/2021      Wt Readings from Last 3 Encounters:  11/06/21 179 lb 2 oz (81.3 kg)  11/05/21 187 lb (84.8 kg)  06/12/21 177 lb 14.6 oz (80.7 kg)       ASSESSMENT AND PLAN:  Problem List Items Addressed This Visit       Cardiology Problems   Left bundle branch block   Relevant Orders   EKG 12-Lead     Other   Chronic kidney disease (CKD) stage G4/A1, severely decreased glomerular filtration rate (GFR) between 15-29 mL/min/1.73 square meter and albuminuria creatinine ratio less than 30 mg/g (HCC) - Primary   Relevant Orders   Basic metabolic panel   Other Visit Diagnoses     Pleural effusion       Leg edema       COVID       Essential hypertension          Essential hypertension Currently not on any medication, blood pressure 213 systolic on today's visit Recommend she closely monitor blood pressure at home In the future if needed potentially could start low-dose ARB under the guidance of nephrology  Acute on chronic renal dysfunction Creatinine up to 1.9, etiology unclear She is uncertain whether she has had renal ultrasound in the past Unable to exclude cardiorenal syndrome given pleural effusions, chest x-ray concerning for pulmonary edema, leg edema, PND/orthopnea with cough --Echocardiogram pending --Recommend she continue Lasix daily for now, repeat BMP in 1 week to 10 days  Pleural effusion Noted to be small bilateral on chest x-ray Again concerning for fluid overload, CHF Agree with Lasix started by primary care  Left bundle branch block Dates back to 2014 Suspect may be unrelated to today's presentation Echocardiogram pending May warrant ischemic work-up at some point   Total encounter time more than 60 minutes  Greater than 50% was spent in counseling and coordination of care  with the patient    Signed, Esmond Plants, M.D., Ph.D. [place], [place]

## 2021-11-06 NOTE — Patient Instructions (Signed)
Medication Instructions:  No changes  Continue lasix daily  If you need a refill on your cardiac medications before your next appointment, please call your pharmacy.   Lab work: BMP a week   Testing/Procedures: No new testing needed  Follow-Up: At [facility], you and your health needs are our priority.  As part of our continuing mission to provide you with exceptional heart care, we have created designated Provider Care Teams.  These Care Teams include your primary Cardiologist (physician) and Advanced Practice Providers (APPs -  Physician Assistants and Nurse Practitioners) who all work together to provide you with the care you need, when you need it.  You will need a follow up appointment in 1 month  Providers on your designated Care Team:   Murray Hodgkins, NP Christell Faith, PA-C Cadence Kathlen Mody, [place]  COVID-19 Vaccine Information can be found at: ShippingScam.co.uk For questions related to vaccine distribution or appointments, please email vaccine@[email or web address] or call [phone].

## 2021-11-06 NOTE — Assessment & Plan Note (Signed)
Lower extremity edema, 3+ bil pitting, concerned for CHF. Will r/o out this as ddx. Ordering CMP today , BNP  Starting pt on lasix 20 mg once daily, will hold on potassium.

## 2021-11-07 ENCOUNTER — Ambulatory Visit
Admission: RE | Admit: 2021-11-07 | Discharge: 2021-11-07 | Disposition: A | Discharge status: Home / Self Care | Payer: Medicare PPO | Source: Ambulatory Visit | Attending: Family | Admitting: Family

## 2021-11-07 ENCOUNTER — Other Ambulatory Visit: Payer: Self-pay

## 2021-11-07 DIAGNOSIS — I447 Left bundle-branch block, unspecified: Secondary | ICD-10-CM | POA: Diagnosis not present

## 2021-11-07 DIAGNOSIS — R0609 Other forms of dyspnea: Secondary | ICD-10-CM | POA: Diagnosis not present

## 2021-11-07 DIAGNOSIS — R600 Localized edema: Secondary | ICD-10-CM | POA: Insufficient documentation

## 2021-11-07 DIAGNOSIS — R5383 Other fatigue: Secondary | ICD-10-CM | POA: Insufficient documentation

## 2021-11-07 DIAGNOSIS — I10 Essential (primary) hypertension: Secondary | ICD-10-CM | POA: Diagnosis not present

## 2021-11-07 DIAGNOSIS — R0600 Dyspnea, unspecified: Secondary | ICD-10-CM | POA: Diagnosis present

## 2021-11-07 LAB — ECHOCARDIOGRAM COMPLETE
AR max vel: 1.96 cm2
AV Area VTI: 2.13 cm2
AV Area mean vel: 1.99 cm2
AV Mean grad: 3 mmHg
AV Peak grad: 5.7 mmHg
Ao pk vel: 1.19 m/s
Area-P 1/2: 3.99 cm2
Calc EF: 35.6 %
MV VTI: 1.82 cm2
S' Lateral: 3.5 cm
Single Plane A2C EF: 37.1 %
Single Plane A4C EF: 32.5 %

## 2021-11-07 NOTE — Progress Notes (Signed)
*  PRELIMINARY RESULTS* ?Echocardiogram ?2D Echocardiogram has been performed. ? ?Katherine Brooks, Sonia Side ?11/07/2021, 10:45 AM ?

## 2021-11-07 NOTE — Progress Notes (Signed)
*  PRELIMINARY RESULTS* ?Echocardiogram ?2D Echocardiogram has been performed. ? ?Emalyn Schou, [place]onia Side ?11/07/2021, 10:45 AM ?

## 2021-11-08 ENCOUNTER — Encounter: Payer: Self-pay | Admitting: Family

## 2021-11-08 DIAGNOSIS — I5189 Other ill-defined heart diseases: Secondary | ICD-10-CM | POA: Insufficient documentation

## 2021-11-08 DIAGNOSIS — J309 Allergic rhinitis, unspecified: Secondary | ICD-10-CM | POA: Insufficient documentation

## 2021-11-08 NOTE — Assessment & Plan Note (Signed)
Cbc tsh and b12 ordered pending results ?Bmp ordered also pending ?D/c labetolol as may worsen fatigue ?

## 2021-11-08 NOTE — Assessment & Plan Note (Addendum)
Found on ekg.  ?Pt symptomatic.  ?If any cp worsening sob immediately to ER.  ?Refer cardiologist for eval/treat ?Suspected underlying CHF, w/u in progress ?

## 2021-11-08 NOTE — Assessment & Plan Note (Signed)
Order bmp

## 2021-11-08 NOTE — Assessment & Plan Note (Addendum)
cxr and echocardiogram ordered CHF suspected ?Also ordering Pro BNP stat for w/u ?CBC to r/o infection  ?ekg in office today with LBB finding ? ?extensive time spend in exam room with diagnostic testing, review of old notes and labs, concerning phyiscal exam and review of acute symptoms in detail to total 53  minutes ?

## 2021-11-08 NOTE — Assessment & Plan Note (Signed)
Experiencing more hypotension at times.  ?D/c labetolol on low dose currrently no need to taper  ? ?

## 2021-11-08 NOTE — Assessment & Plan Note (Signed)
Sending pt her own rx for singulair as taking daughters  ?Relief with some of sob/post nasal drip ?

## 2021-11-09 ENCOUNTER — Ambulatory Visit: Payer: Medicare PPO

## 2021-11-13 ENCOUNTER — Other Ambulatory Visit: Payer: Self-pay

## 2021-11-13 ENCOUNTER — Other Ambulatory Visit (INDEPENDENT_AMBULATORY_CARE_PROVIDER_SITE_OTHER): Payer: Medicare PPO

## 2021-11-13 DIAGNOSIS — N184 Chronic kidney disease, stage 4 (severe): Secondary | ICD-10-CM

## 2021-11-14 LAB — BASIC METABOLIC PANEL
BUN/Creatinine Ratio: 11 — ABNORMAL LOW (ref 12–28)
BUN: 19 mg/dL (ref 8–27)
CO2: 21 mmol/L (ref 20–29)
Calcium: 9.3 mg/dL (ref 8.7–10.3)
Chloride: 103 mmol/L (ref 96–106)
Creatinine, Ser: 1.73 mg/dL — ABNORMAL HIGH (ref 0.57–1.00)
Glucose: 94 mg/dL (ref 70–99)
Potassium: 4.9 mmol/L (ref 3.5–5.2)
Sodium: 138 mmol/L (ref 134–144)
eGFR: 29 mL/min/{1.73_m2} — ABNORMAL LOW (ref >59)

## 2021-11-17 ENCOUNTER — Encounter: Payer: Self-pay | Admitting: Cardiovascular Disease

## 2021-12-06 DIAGNOSIS — D631 Anemia in chronic kidney disease: Secondary | ICD-10-CM | POA: Diagnosis not present

## 2021-12-06 DIAGNOSIS — I10 Essential (primary) hypertension: Secondary | ICD-10-CM | POA: Diagnosis not present

## 2021-12-06 DIAGNOSIS — N184 Chronic kidney disease, stage 4 (severe): Secondary | ICD-10-CM | POA: Diagnosis not present

## 2021-12-09 NOTE — H&P (View-Only) (Signed)
Cardiology Office Note ? ?Date:  12/10/2021  ? ?ID:  Katherine Brooks, DOB June 12, 1936, MRN [id number] ? ?PCP:  Abner Greenspan, MD  ? ?Chief Complaint  ?Patient presents with  ? 1 month follow up   ?  Discuss Echo results. Medications reviewed by the patient verbally.   ? ? ?HPI:  ?Katherine Brooks is a 86 year old woman with past medical history of ?COVID January 2023 ?Hypertension ?Left bundle branch block ?Chronic kidney disease GFR 28, dr.  Holley Raring ,nephrology ?Presenting for f/u of her dyspnea on exertion , cardiomyopathy ejection fraction 30 to 35% ? ?LOV 11/06/21 ?Worsening shortness of breath ?X-ray February 2023 with small bilateral pleural effusions, possible pulmonary edema ? ?Seen in the office in late February 2023 ?Echo ordered, reviewed in detail on today's visit ?EF 30 to 35% ? 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 ?Did not feel well last month, ?Feeling better now ?Denies significant leg swelling ?Continues on Lasix daily ? ?Lab work reviewed ?Creatinine 1.7 ? ?Previously on amlodipine, this was held for leg swelling ?Was changed to labetalol ?Reports she is not on labetalol today secondary to wheezing ? ?Total cholesterol 197 ?Hemoglobin 11.5 ?LDL 119 ? ? ?PMH:   has a past medical history of Allergy, Cancer ([place]), Facial pain, and Hypertension. ? ?PSH:    ?Past Surgical History:  ?Procedure Laterality Date  ? salivary gland N/A 12/2012  ? ? ?Current Outpatient Medications  ?Medication Sig Dispense Refill  ? Ascorbic Acid (VITAMIN C) 1000 MG tablet     ? Biotin 10000 MCG TABS Take by mouth.    ? Calcium Carbonate-Vit D-Min (CALCIUM 1200 PO) Take by mouth.    ? Cholecalciferol (VITAMIN D3) 50 MCG (2000 UT) TABS Take 2,000 Units by mouth daily.    ? furosemide (LASIX) 20 MG tablet Take 1 tablet (20 mg total) by mouth  daily. 30 tablet 0  ? MAGNESIUM PO Take 5 mg by mouth daily.    ? montelukast (SINGULAIR) 10 MG tablet Take 1 tablet (10 mg total) by mouth at bedtime. 30 tablet 3  ? Multiple Vitamins-Minerals (WOMENS 50+ MULTI VITAMIN/MIN PO) Take 1 capsule by mouth daily.    ? ?No current facility-administered medications for this visit.  ? ? ?Allergies:   Lisinopril and Carbamazepine  ? ?Social History:  The patient  reports that she has never smoked. She has never used smokeless tobacco. She reports that she does not drink alcohol and does not use drugs.  ? ?Family History:   family history includes COPD in her brother; Cancer in her sister; Heart disease in her mother.  ? ? ?Review of Systems: ?Review of Systems  ?Constitutional: Negative.   ?HENT: Negative.    ?Respiratory:  Positive for cough and shortness of breath.   ?Cardiovascular:  Positive for leg swelling.  ?Gastrointestinal: Negative.   ?Musculoskeletal: Negative.   ?Neurological: Negative.   ?Psychiatric/Behavioral: Negative.    ?All other systems reviewed and are negative. ? ? ?PHYSICAL EXAM: ?VS:  BP 138/70 (BP Location: Left Arm, Patient Position: Sitting, Cuff Size: Normal)   Pulse 78   Ht 5' (1.524 m)   Wt 80.9 kg   SpO2 98%   BMI 34.84 kg/m?  , BMI Body mass index is 34.84 kg/m?Marland Kitchen ?Constitutional:  oriented to person, place, and time. No distress.  ?HENT:  ?  Head: Grossly normal ?Eyes:  no discharge. No scleral icterus.  ?Neck: No JVD, no carotid bruits  ?Cardiovascular: Regular rate and rhythm, no murmurs appreciated ?Pulmonary/Chest: Clear to auscultation bilaterally, no wheezes or rails ?Abdominal: Soft.  no distension.  no tenderness.  ?Musculoskeletal: Normal range of motion ?Neurological:  normal muscle tone. Coordination normal. No atrophy ?Skin: Skin warm and dry ?Psychiatric: normal affect, pleasant ? ?Recent Labs: ?02/26/2021: ALT 11 ?11/05/2021: Hemoglobin 11.5; Platelets 322.0; Pro B Natriuretic peptide (BNP) 884.0; TSH 3.19 ?11/13/2021: BUN 19;  Creatinine, Ser 1.73; Potassium 4.9; Sodium 138  ? ? ?Lipid Panel ?Lab Results  ?Component Value Date  ? CHOL 197 02/26/2021  ? HDL 63.20 02/26/2021  ? LDLCALC 119 (H) 02/26/2021  ? TRIG 76.0 02/26/2021  ? ?  ? ?Wt Readings from Last 3 Encounters:  ?12/10/21 80.9 kg  ?11/06/21 81.3 kg  ?11/05/21 84.8 kg  ?  ? ?ASSESSMENT AND PLAN: ? ?Problem List Items Addressed This Visit   ? ? Left bundle branch block  ? DOE (dyspnea on exertion)  ? Pedal edema  ? Chronic kidney disease (CKD) stage G4/A1, severely decreased glomerular filtration rate (GFR) between 15-29 mL/min/1.73 square meter and albuminuria creatinine ratio less than 30 mg/g (HCC)  ? ?Other Visit Diagnoses   ? ? Dilated cardiomyopathy ([place])    -  Primary  ? Essential hypertension      ? COVID      ? ?  ?Dilated cardiomyopathy ?Ejection fraction 30 to 35% ?Etiology unclear, unable to exclude ischemia, versus stress cardiomyopathy versus other etiology ?Significant stress at home taking care of husband who has Parkinson's related symptoms ?We have ordered a Myoview.  She will try to get this done when her daughter visits from [place] in May 2023 ?-Recommend she start Coreg 3.125 twice daily, Entresto 24/26 mg p.o. twice daily ?She is scheduled to see nephrology next month.  Consider BMP at that time ?Continue Lasix daily ? ?Essential hypertension ?In the setting of cardiomyopathy, medications as above ?She will continue to monitor blood pressure at home ? ? chronic renal dysfunction ?Creatinine up to 1.7, followed by nephrology ?renal ultrasound 2022 ?We will start ARB, will need periodic lab work ? ?Pleural effusion ?small bilateral on chest x-ray ?Likely secondary to cardiomyopathy as above ? ?Left bundle branch block ?Dates back to 2014 ?Echocardiogram results as above, EF 30 to 35% ? ? Total encounter time more than 30 minutes ? Greater than 50% was spent in counseling and coordination of care with the patient ? ? ? ?Signed, ?Esmond Plants, M.D., Ph.D. ?[facility]  [facility] [place], [place] ?[phone] ?

## 2021-12-09 NOTE — Progress Notes (Signed)
Cardiology Office Note ? ?Date:  12/10/2021  ? ?ID:  CAMRIN LAPRE, DOB 18-Feb-1936, MRN [id number] ? ?PCP:  Abner Greenspan, MD  ? ?Chief Complaint  ?Patient presents with  ? 1 month follow up   ?  Discuss Echo results. Medications reviewed by the patient verbally.   ? ? ?HPI:  ?Ms. Leather Estis is a 86 year old woman with past medical history of ?COVID January 2023 ?Hypertension ?Left bundle branch block ?Chronic kidney disease GFR 28, dr.  Holley Raring ,nephrology ?Presenting for f/u of her dyspnea on exertion , cardiomyopathy ejection fraction 30 to 35% ? ?LOV 11/06/21 ?Worsening shortness of breath ?X-ray February 2023 with small bilateral pleural effusions, possible pulmonary edema ? ?Seen in the office in late February 2023 ?Echo ordered, reviewed in detail on today's visit ?EF 30 to 35% ? 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 ?Did not feel well last month, ?Feeling better now ?Denies significant leg swelling ?Continues on Lasix daily ? ?Lab work reviewed ?Creatinine 1.7 ? ?Previously on amlodipine, this was held for leg swelling ?Was changed to labetalol ?Reports she is not on labetalol today secondary to wheezing ? ?Total cholesterol 197 ?Hemoglobin 11.5 ?LDL 119 ? ? ?PMH:   has a past medical history of Allergy, Cancer ([place]), Facial pain, and Hypertension. ? ?PSH:    ?Past Surgical History:  ?Procedure Laterality Date  ? salivary gland N/A 12/2012  ? ? ?Current Outpatient Medications  ?Medication Sig Dispense Refill  ? Ascorbic Acid (VITAMIN C) 1000 MG tablet     ? Biotin 10000 MCG TABS Take by mouth.    ? Calcium Carbonate-Vit D-Min (CALCIUM 1200 PO) Take by mouth.    ? Cholecalciferol (VITAMIN D3) 50 MCG (2000 UT) TABS Take 2,000 Units by mouth daily.    ? furosemide (LASIX) 20 MG tablet Take 1 tablet (20 mg total) by mouth  daily. 30 tablet 0  ? MAGNESIUM PO Take 5 mg by mouth daily.    ? montelukast (SINGULAIR) 10 MG tablet Take 1 tablet (10 mg total) by mouth at bedtime. 30 tablet 3  ? Multiple Vitamins-Minerals (WOMENS 50+ MULTI VITAMIN/MIN PO) Take 1 capsule by mouth daily.    ? ?No current facility-administered medications for this visit.  ? ? ?Allergies:   Lisinopril and Carbamazepine  ? ?Social History:  The patient  reports that she has never smoked. She has never used smokeless tobacco. She reports that she does not drink alcohol and does not use drugs.  ? ?Family History:   family history includes COPD in her brother; Cancer in her sister; Heart disease in her mother.  ? ? ?Review of Systems: ?Review of Systems  ?Constitutional: Negative.   ?HENT: Negative.    ?Respiratory:  Positive for cough and shortness of breath.   ?Cardiovascular:  Positive for leg swelling.  ?Gastrointestinal: Negative.   ?Musculoskeletal: Negative.   ?Neurological: Negative.   ?Psychiatric/Behavioral: Negative.    ?All other systems reviewed and are negative. ? ? ?PHYSICAL EXAM: ?VS:  BP 138/70 (BP Location: Left Arm, Patient Position: Sitting, Cuff Size: Normal)   Pulse 78   Ht 5' (1.524 m)   Wt 80.9 kg   SpO2 98%   BMI 34.84 kg/m?  , BMI Body mass index is 34.84 kg/m?Marland Kitchen ?Constitutional:  oriented to person, place, and time. No distress.  ?HENT:  ?  Head: Grossly normal ?Eyes:  no discharge. No scleral icterus.  ?Neck: No JVD, no carotid bruits  ?Cardiovascular: Regular rate and rhythm, no murmurs appreciated ?Pulmonary/Chest: Clear to auscultation bilaterally, no wheezes or rails ?Abdominal: Soft.  no distension.  no tenderness.  ?Musculoskeletal: Normal range of motion ?Neurological:  normal muscle tone. Coordination normal. No atrophy ?Skin: Skin warm and dry ?Psychiatric: normal affect, pleasant ? ?Recent Labs: ?02/26/2021: ALT 11 ?11/05/2021: Hemoglobin 11.5; Platelets 322.0; Pro B Natriuretic peptide (BNP) 884.0; TSH 3.19 ?11/13/2021: BUN 19;  Creatinine, Ser 1.73; Potassium 4.9; Sodium 138  ? ? ?Lipid Panel ?Lab Results  ?Component Value Date  ? CHOL 197 02/26/2021  ? HDL 63.20 02/26/2021  ? LDLCALC 119 (H) 02/26/2021  ? TRIG 76.0 02/26/2021  ? ?  ? ?Wt Readings from Last 3 Encounters:  ?12/10/21 80.9 kg  ?11/06/21 81.3 kg  ?11/05/21 84.8 kg  ?  ? ?ASSESSMENT AND PLAN: ? ?Problem List Items Addressed This Visit   ? ? Left bundle branch block  ? DOE (dyspnea on exertion)  ? Pedal edema  ? Chronic kidney disease (CKD) stage G4/A1, severely decreased glomerular filtration rate (GFR) between 15-29 mL/min/1.73 square meter and albuminuria creatinine ratio less than 30 mg/g (HCC)  ? ?Other Visit Diagnoses   ? ? Dilated cardiomyopathy ([place])    -  Primary  ? Essential hypertension      ? COVID      ? ?  ?Dilated cardiomyopathy ?Ejection fraction 30 to 35% ?Etiology unclear, unable to exclude ischemia, versus stress cardiomyopathy versus other etiology ?Significant stress at home taking care of husband who has Parkinson's related symptoms ?We have ordered a Myoview.  She will try to get this done when her daughter visits from [place] in May 2023 ?-Recommend she start Coreg 3.125 twice daily, Entresto 24/26 mg p.o. twice daily ?She is scheduled to see nephrology next month.  Consider BMP at that time ?Continue Lasix daily ? ?Essential hypertension ?In the setting of cardiomyopathy, medications as above ?She will continue to monitor blood pressure at home ? ? chronic renal dysfunction ?Creatinine up to 1.7, followed by nephrology ?renal ultrasound 2022 ?We will start ARB, will need periodic lab work ? ?Pleural effusion ?small bilateral on chest x-ray ?Likely secondary to cardiomyopathy as above ? ?Left bundle branch block ?Dates back to 2014 ?Echocardiogram results as above, EF 30 to 35% ? ? Total encounter time more than 30 minutes ? Greater than 50% was spent in counseling and coordination of care with the patient ? ? ? ?Signed, ?Esmond Plants, M.D., Ph.D. ?[facility]  [facility] [place], [place] ?[phone] ?

## 2021-12-10 ENCOUNTER — Encounter: Payer: Self-pay | Admitting: Cardiovascular Disease

## 2021-12-10 ENCOUNTER — Ambulatory Visit: Payer: Medicare PPO | Admitting: Cardiovascular Disease

## 2021-12-10 ENCOUNTER — Other Ambulatory Visit: Payer: Self-pay

## 2021-12-10 VITALS — BP 138/70 | HR 78 | Ht 60.0 in | Wt 178.4 lb

## 2021-12-10 DIAGNOSIS — N184 Chronic kidney disease, stage 4 (severe): Secondary | ICD-10-CM | POA: Diagnosis not present

## 2021-12-10 DIAGNOSIS — R0609 Other forms of dyspnea: Secondary | ICD-10-CM | POA: Diagnosis not present

## 2021-12-10 DIAGNOSIS — I10 Essential (primary) hypertension: Secondary | ICD-10-CM

## 2021-12-10 DIAGNOSIS — I209 Angina pectoris, unspecified: Secondary | ICD-10-CM

## 2021-12-10 DIAGNOSIS — I420 Dilated cardiomyopathy: Secondary | ICD-10-CM

## 2021-12-10 DIAGNOSIS — I447 Left bundle-branch block, unspecified: Secondary | ICD-10-CM | POA: Diagnosis not present

## 2021-12-10 DIAGNOSIS — U071 COVID-19: Secondary | ICD-10-CM

## 2021-12-10 DIAGNOSIS — R600 Localized edema: Secondary | ICD-10-CM

## 2021-12-10 MED ORDER — CARVEDILOL 3.125 MG PO TABS: 3.1250 mg | ORAL_TABLET | Freq: Two times a day (BID) | ORAL | 0 refills | Status: DC

## 2021-12-10 MED ORDER — ENTRESTO 24-26 MG PO TABS: 1.0000 | ORAL_TABLET | Freq: Two times a day (BID) | ORAL | 0 refills | Status: DC

## 2021-12-10 NOTE — Patient Instructions (Addendum)
Medication Instructions:  ?Please start coreg 3.125 twice a day ?Please start entresto 24/26 mg twice a day ? ?If you need a refill on your cardiac medications before your next appointment, please call your pharmacy.  ? ?Lab work: ?No new labs needed ? ?Testing/Procedures: ? ?[facility] MYOVIEW ? ?Your caregiver has ordered a Stress Test with nuclear imaging. The purpose of this test is to evaluate the blood supply to your heart muscle. This procedure is referred to as a "Non-Invasive Stress Test." This is because other than having an IV started in your vein, nothing is inserted or "invades" your body. Cardiac stress tests are done to find areas of poor blood flow to the heart by determining the extent of coronary artery disease (CAD). Some patients exercise on a treadmill, which naturally increases the blood flow to your heart, while others who are  unable to walk on a treadmill due to physical limitations have a pharmacologic/chemical stress agent called Lexiscan . This medicine will mimic walking on a treadmill by temporarily increasing your coronary blood flow.  ? ?Please note: these test may take anywhere between 2-4 hours to complete ? ?PLEASE REPORT TO [facility] MEDICAL MALL ENTRANCE  ?THE VOLUNTEERS AT THE FIRST DESK WILL DIRECT YOU WHERE TO GO ? ?Date of Procedure:_____________________________________ ? ?Arrival Time for Procedure:______________________________ ? ?PLEASE NOTIFY THE OFFICE AT LEAST 33 HOURS IN ADVANCE IF YOU ARE UNABLE TO KEEP YOUR APPOINTMENT.  [phone] ?AND  ?PLEASE NOTIFY NUCLEAR MEDICINE AT [facility] AT LEAST 47 HOURS IN ADVANCE IF YOU ARE UNABLE TO KEEP YOUR APPOINTMENT. [phone] ? ?How to prepare for your Myoview test: ? ?Do not eat or drink after midnight ?No caffeine for 24 hours prior to test ?No smoking 24 hours prior to test. ?Your medication may be taken with water.  If your doctor stopped a medication because of this test, do not take that medication. ?Ladies, please do not wear  dresses.  Skirts or pants are appropriate. Please wear a short sleeve shirt. ?No perfume, cologne or lotion. ?Wear comfortable walking shoes. No heels! ? ? ?Follow-Up: ?At [facility], you and your health needs are our priority.  As part of our continuing mission to provide you with exceptional heart care, we have created designated Provider Care Teams.  These Care Teams include your primary Cardiologist (physician) and Advanced Practice Providers (APPs -  Physician Assistants and Nurse Practitioners) who all work together to provide you with the care you need, when you need it. ? ?You will need a follow up appointment in 3 months ? ?Providers on your designated Care Team:   ?Murray Hodgkins, NP ?Christell Faith, PA-C ?Cadence Kathlen Mody, PA-C ? ?COVID-19 Vaccine Information can be found at: [email or web address].co.uk For questions related to vaccine distribution or appointments, please email [email or web address] or call [phone].  ? ?

## 2021-12-28 ENCOUNTER — Encounter
Admission: RE | Admit: 2021-12-28 | Discharge: 2021-12-28 | Disposition: A | Discharge status: Home / Self Care | Payer: Medicare PPO | Source: Ambulatory Visit | Attending: Cardiovascular Disease | Admitting: Cardiovascular Disease

## 2021-12-28 ENCOUNTER — Telehealth: Payer: Self-pay | Admitting: Emergency Medicine

## 2021-12-28 ENCOUNTER — Encounter: Payer: Self-pay | Admitting: Cardiovascular Disease

## 2021-12-28 DIAGNOSIS — I420 Dilated cardiomyopathy: Secondary | ICD-10-CM | POA: Insufficient documentation

## 2021-12-28 LAB — NM MYOCAR MULTI W/SPECT W/WALL MOTION / EF
LV dias vol: 66 mL (ref 46–106)
LV sys vol: 27 mL
Nuc Stress EF: 59 %
Peak HR: 83 {beats}/min
Percent HR: 60 %
Rest HR: 57 {beats}/min
Rest Nuclear Isotope Dose: 10.5 mCi
SDS: 14
SRS: 4
SSS: 15
ST Depression (mm): 0 mm
Stress Nuclear Isotope Dose: 32 mCi
TID: 1.13

## 2021-12-28 MED ORDER — TECHNETIUM TC 99M TETROFOSMIN IV KIT
10.5200 | PACK | Freq: Once | INTRAVENOUS | Status: AC | PRN
  Administered 2021-12-28: 10.52 via INTRAVENOUS

## 2021-12-28 MED ORDER — REGADENOSON 0.4 MG/5ML IV SOLN
0.4000 mg | Freq: Once | INTRAVENOUS | Status: AC
  Administered 2021-12-28: 0.4 mg via INTRAVENOUS
  Filled 2021-12-28: qty 5

## 2021-12-28 MED ORDER — TECHNETIUM TC 99M TETROFOSMIN IV KIT
30.0000 | PACK | Freq: Once | INTRAVENOUS | Status: AC
  Administered 2021-12-28: 31.95 via INTRAVENOUS

## 2021-12-28 NOTE — Telephone Encounter (Signed)
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 ?

## 2021-12-28 NOTE — Telephone Encounter (Signed)
Called and spoke with patient. Reviewed results and recommendations with patient. Pt verbalized understanding.  ? ?Patient will speak with daughters and call back to make appt with Dr. Rockey Situ or APP.  ?

## 2021-12-31 ENCOUNTER — Other Ambulatory Visit
Admission: RE | Admit: 2021-12-31 | Discharge: 2021-12-31 | Disposition: A | Discharge status: Home / Self Care | Payer: Medicare PPO | Attending: Cardiovascular Disease | Admitting: Cardiovascular Disease

## 2021-12-31 ENCOUNTER — Encounter: Payer: Self-pay | Admitting: Oncology

## 2021-12-31 ENCOUNTER — Inpatient Hospital Stay (HOSPITAL_BASED_OUTPATIENT_CLINIC_OR_DEPARTMENT_OTHER): Payer: Medicare PPO | Admitting: Oncology

## 2021-12-31 ENCOUNTER — Inpatient Hospital Stay: Payer: Medicare PPO

## 2021-12-31 ENCOUNTER — Other Ambulatory Visit: Payer: Self-pay | Admitting: Cardiovascular Disease

## 2021-12-31 ENCOUNTER — Telehealth: Payer: Self-pay | Admitting: Emergency Medicine

## 2021-12-31 VITALS — BP 144/64 | HR 64 | Temp 96.8°F | Resp 18 | Wt 175.5 lb

## 2021-12-31 DIAGNOSIS — E669 Obesity, unspecified: Secondary | ICD-10-CM | POA: Diagnosis not present

## 2021-12-31 DIAGNOSIS — D631 Anemia in chronic kidney disease: Secondary | ICD-10-CM | POA: Diagnosis present

## 2021-12-31 DIAGNOSIS — N289 Disorder of kidney and ureter, unspecified: Secondary | ICD-10-CM | POA: Diagnosis not present

## 2021-12-31 DIAGNOSIS — N184 Chronic kidney disease, stage 4 (severe): Secondary | ICD-10-CM | POA: Insufficient documentation

## 2021-12-31 DIAGNOSIS — Z888 Allergy status to other drugs, medicaments and biological substances status: Secondary | ICD-10-CM | POA: Diagnosis not present

## 2021-12-31 DIAGNOSIS — D72829 Elevated white blood cell count, unspecified: Secondary | ICD-10-CM | POA: Diagnosis not present

## 2021-12-31 DIAGNOSIS — K449 Diaphragmatic hernia without obstruction or gangrene: Secondary | ICD-10-CM | POA: Diagnosis not present

## 2021-12-31 DIAGNOSIS — I25110 Atherosclerotic heart disease of native coronary artery with unstable angina pectoris: Secondary | ICD-10-CM | POA: Diagnosis not present

## 2021-12-31 DIAGNOSIS — E785 Hyperlipidemia, unspecified: Secondary | ICD-10-CM | POA: Diagnosis present

## 2021-12-31 DIAGNOSIS — I5022 Chronic systolic (congestive) heart failure: Secondary | ICD-10-CM | POA: Diagnosis not present

## 2021-12-31 DIAGNOSIS — R0609 Other forms of dyspnea: Secondary | ICD-10-CM | POA: Diagnosis not present

## 2021-12-31 DIAGNOSIS — Z7982 Long term (current) use of aspirin: Secondary | ICD-10-CM | POA: Diagnosis not present

## 2021-12-31 DIAGNOSIS — Z8616 Personal history of COVID-19: Secondary | ICD-10-CM | POA: Diagnosis not present

## 2021-12-31 DIAGNOSIS — Z01818 Encounter for other preprocedural examination: Secondary | ICD-10-CM | POA: Diagnosis not present

## 2021-12-31 DIAGNOSIS — Z79899 Other long term (current) drug therapy: Secondary | ICD-10-CM | POA: Diagnosis not present

## 2021-12-31 DIAGNOSIS — I951 Orthostatic hypotension: Secondary | ICD-10-CM | POA: Diagnosis not present

## 2021-12-31 DIAGNOSIS — I200 Unstable angina: Secondary | ICD-10-CM

## 2021-12-31 DIAGNOSIS — I340 Nonrheumatic mitral (valve) insufficiency: Secondary | ICD-10-CM | POA: Diagnosis not present

## 2021-12-31 DIAGNOSIS — Z01810 Encounter for preprocedural cardiovascular examination: Secondary | ICD-10-CM | POA: Diagnosis not present

## 2021-12-31 DIAGNOSIS — R918 Other nonspecific abnormal finding of lung field: Secondary | ICD-10-CM | POA: Diagnosis not present

## 2021-12-31 DIAGNOSIS — M810 Age-related osteoporosis without current pathological fracture: Secondary | ICD-10-CM | POA: Diagnosis present

## 2021-12-31 DIAGNOSIS — D62 Acute posthemorrhagic anemia: Secondary | ICD-10-CM | POA: Diagnosis not present

## 2021-12-31 DIAGNOSIS — I447 Left bundle-branch block, unspecified: Secondary | ICD-10-CM | POA: Diagnosis not present

## 2021-12-31 DIAGNOSIS — I420 Dilated cardiomyopathy: Secondary | ICD-10-CM | POA: Diagnosis not present

## 2021-12-31 DIAGNOSIS — I2510 Atherosclerotic heart disease of native coronary artery without angina pectoris: Secondary | ICD-10-CM | POA: Diagnosis not present

## 2021-12-31 DIAGNOSIS — Z951 Presence of aortocoronary bypass graft: Secondary | ICD-10-CM | POA: Diagnosis not present

## 2021-12-31 DIAGNOSIS — I454 Nonspecific intraventricular block: Secondary | ICD-10-CM | POA: Diagnosis present

## 2021-12-31 DIAGNOSIS — I10 Essential (primary) hypertension: Secondary | ICD-10-CM | POA: Diagnosis not present

## 2021-12-31 DIAGNOSIS — I130 Hypertensive heart and chronic kidney disease with heart failure and stage 1 through stage 4 chronic kidney disease, or unspecified chronic kidney disease: Secondary | ICD-10-CM | POA: Diagnosis not present

## 2021-12-31 DIAGNOSIS — J9811 Atelectasis: Secondary | ICD-10-CM | POA: Diagnosis not present

## 2021-12-31 DIAGNOSIS — I25118 Atherosclerotic heart disease of native coronary artery with other forms of angina pectoris: Secondary | ICD-10-CM | POA: Diagnosis not present

## 2021-12-31 DIAGNOSIS — I428 Other cardiomyopathies: Secondary | ICD-10-CM | POA: Diagnosis not present

## 2021-12-31 DIAGNOSIS — Z8249 Family history of ischemic heart disease and other diseases of the circulatory system: Secondary | ICD-10-CM | POA: Diagnosis not present

## 2021-12-31 DIAGNOSIS — R931 Abnormal findings on diagnostic imaging of heart and coronary circulation: Secondary | ICD-10-CM | POA: Diagnosis not present

## 2021-12-31 DIAGNOSIS — N183 Chronic kidney disease, stage 3 unspecified: Secondary | ICD-10-CM | POA: Diagnosis not present

## 2021-12-31 DIAGNOSIS — Z85828 Personal history of other malignant neoplasm of skin: Secondary | ICD-10-CM | POA: Diagnosis not present

## 2021-12-31 DIAGNOSIS — I255 Ischemic cardiomyopathy: Secondary | ICD-10-CM | POA: Diagnosis present

## 2021-12-31 DIAGNOSIS — Z6834 Body mass index (BMI) 34.0-34.9, adult: Secondary | ICD-10-CM | POA: Diagnosis not present

## 2021-12-31 DIAGNOSIS — Z20822 Contact with and (suspected) exposure to covid-19: Secondary | ICD-10-CM | POA: Diagnosis not present

## 2021-12-31 DIAGNOSIS — Z4682 Encounter for fitting and adjustment of non-vascular catheter: Secondary | ICD-10-CM | POA: Diagnosis not present

## 2021-12-31 DIAGNOSIS — I5020 Unspecified systolic (congestive) heart failure: Secondary | ICD-10-CM | POA: Diagnosis not present

## 2021-12-31 LAB — CBC
HCT: 41.7 % (ref 36.0–46.0)
Hemoglobin: 13.5 g/dL (ref 12.0–15.0)
MCH: 28.7 pg (ref 26.0–34.0)
MCHC: 32.4 g/dL (ref 30.0–36.0)
MCV: 88.7 fL (ref 80.0–100.0)
Platelets: 259 10*3/uL (ref 150–400)
RBC: 4.7 MIL/uL (ref 3.87–5.11)
RDW: 13.7 % (ref 11.5–15.5)
WBC: 7.7 10*3/uL (ref 4.0–10.5)
nRBC: 0 % (ref 0.0–0.2)

## 2021-12-31 LAB — TECHNOLOGIST SMEAR REVIEW
Plt Morphology: NORMAL
RBC MORPHOLOGY: NORMAL
WBC MORPHOLOGY: NORMAL

## 2021-12-31 LAB — COMPREHENSIVE METABOLIC PANEL
ALT: 13 U/L (ref 0–44)
AST: 20 U/L (ref 15–41)
Albumin: 4.6 g/dL (ref 3.5–5.0)
Alkaline Phosphatase: 58 U/L (ref 38–126)
Anion gap: 8 (ref 5–15)
BUN: 43 mg/dL — ABNORMAL HIGH (ref 8–23)
CO2: 22 mmol/L (ref 22–32)
Calcium: 10.1 mg/dL (ref 8.9–10.3)
Chloride: 106 mmol/L (ref 98–111)
Creatinine, Ser: 1.72 mg/dL — ABNORMAL HIGH (ref 0.44–1.00)
GFR, Estimated: 29 mL/min — ABNORMAL LOW (ref >60)
Glucose, Bld: 113 mg/dL — ABNORMAL HIGH (ref 70–99)
Potassium: 4.8 mmol/L (ref 3.5–5.1)
Sodium: 136 mmol/L (ref 135–145)
Total Bilirubin: 0.7 mg/dL (ref 0.3–1.2)
Total Protein: 8 g/dL (ref 6.5–8.1)

## 2021-12-31 LAB — CBC WITH DIFFERENTIAL/PLATELET
Abs Immature Granulocytes: 0.03 10*3/uL (ref 0.00–0.07)
Basophils Absolute: 0.1 10*3/uL (ref 0.0–0.1)
Basophils Relative: 1 %
Eosinophils Absolute: 0.3 10*3/uL (ref 0.0–0.5)
Eosinophils Relative: 4 %
HCT: 43.8 % (ref 36.0–46.0)
Hemoglobin: 13.8 g/dL (ref 12.0–15.0)
Immature Granulocytes: 0 %
Lymphocytes Relative: 29 %
Lymphs Abs: 2.4 10*3/uL (ref 0.7–4.0)
MCH: 28.2 pg (ref 26.0–34.0)
MCHC: 31.5 g/dL (ref 30.0–36.0)
MCV: 89.4 fL (ref 80.0–100.0)
Monocytes Absolute: 0.6 10*3/uL (ref 0.1–1.0)
Monocytes Relative: 8 %
Neutro Abs: 4.6 10*3/uL (ref 1.7–7.7)
Neutrophils Relative %: 58 %
Platelets: 295 10*3/uL (ref 150–400)
RBC: 4.9 MIL/uL (ref 3.87–5.11)
RDW: 13.8 % (ref 11.5–15.5)
WBC: 8.1 10*3/uL (ref 4.0–10.5)
nRBC: 0 % (ref 0.0–0.2)

## 2021-12-31 LAB — BASIC METABOLIC PANEL
Anion gap: 10 (ref 5–15)
BUN: 45 mg/dL — ABNORMAL HIGH (ref 8–23)
CO2: 23 mmol/L (ref 22–32)
Calcium: 9.7 mg/dL (ref 8.9–10.3)
Chloride: 107 mmol/L (ref 98–111)
Creatinine, Ser: 1.77 mg/dL — ABNORMAL HIGH (ref 0.44–1.00)
GFR, Estimated: 28 mL/min — ABNORMAL LOW (ref >60)
Glucose, Bld: 102 mg/dL — ABNORMAL HIGH (ref 70–99)
Potassium: 5 mmol/L (ref 3.5–5.1)
Sodium: 140 mmol/L (ref 135–145)

## 2021-12-31 LAB — IRON AND TIBC
Iron: 85 ug/dL (ref 28–170)
Saturation Ratios: 19 % (ref 10.4–31.8)
TIBC: 461 ug/dL — ABNORMAL HIGH (ref 250–450)
UIBC: 376 ug/dL

## 2021-12-31 LAB — FERRITIN: Ferritin: 55 ng/mL (ref 11–307)

## 2021-12-31 LAB — LACTATE DEHYDROGENASE: LDH: 135 U/L (ref 98–192)

## 2021-12-31 MED ORDER — SODIUM CHLORIDE 0.9% FLUSH: 3.0000 mL | Freq: Two times a day (BID) | INTRAVENOUS | Status: DC

## 2021-12-31 NOTE — Progress Notes (Signed)
?Hematology/Oncology Consult note ?[phone] [phone] ?  ? ?   ? ? ?Patient Care Team: ?Tower, Wynelle Fanny, MD as PCP - General (Family Medicine) ? ?REFERRING PROVIDER: ?Anthonette Legato, MD  ?CHIEF COMPLAINTS/REASON FOR VISIT:  ?Evaluation of anemia ? ?HISTORY OF PRESENTING ILLNESS:  ? ?Katherine Brooks is a  86 y.o.  female with PMH listed below was seen in consultation at the request of  Anthonette Legato, MD  for evaluation of anemia ? ?Patient recently established with Dr. Holley Raring for chronic kidney disease stage IV.  Most recent EGFR was found to be 29.  Suspect that arterionephrosclerosis as the primary cause of underlying chronic kidney disease.  February 2022, kidney ultrasound showed diminished kidney size bilaterally. ?Patient reports feeling well.  She was accompanied by daughter ?She says she does not eat very well.  Denies any black stool or blood in the stool.  Denies night sweats, fever.  She has intentionally lost a few pounds recently.  11/05/2021, patient had an hemoglobin of 11.5. ? ?Review of Systems  ?Constitutional:  Negative for appetite change, chills, fatigue and fever.  ?HENT:   Negative for hearing loss and voice change.   ?Eyes:  Negative for eye problems.  ?Respiratory:  Negative for chest tightness and cough.   ?Cardiovascular:  Negative for chest pain.  ?Gastrointestinal:  Negative for abdominal distention, abdominal pain and blood in stool.  ?Endocrine: Negative for hot flashes.  ?Genitourinary:  Negative for difficulty urinating and frequency.   ?Musculoskeletal:  Negative for arthralgias.  ?Skin:  Negative for itching and rash.  ?Neurological:  Negative for extremity weakness.  ?Hematological:  Negative for adenopathy.  ?Psychiatric/Behavioral:  Negative for confusion.   ? ?MEDICAL HISTORY:  ?Past Medical History:  ?Diagnosis Date  ? Allergy   ? Cancer [facility])   ? skin  ? Facial pain   ? Hypertension   ? ? ?SURGICAL HISTORY: ?Past Surgical History:  ?Procedure  Laterality Date  ? salivary gland N/A 12/2012  ? ? ?SOCIAL HISTORY: ?Social History  ? ?Socioeconomic History  ? Marital status: Married  ?  Spouse name: Joneen Boers  ? Number of children: 2  ? Years of education: Not on file  ? Highest education level: Some college, no degree  ?Occupational History  ?  Comment: Network engineer, school system  ?Tobacco Use  ? Smoking status: Never  ? Smokeless tobacco: Never  ?Vaping Use  ? Vaping Use: Never used  ?Substance and Sexual Activity  ? Alcohol use: No  ? Drug use: No  ? Sexual activity: Yes  ?Other Topics Concern  ? Not on file  ?Social History Narrative  ? Lives with husband  ? ?Social Determinants of Health  ? ?Financial Resource Strain: Not on file  ?Food Insecurity: Not on file  ?Transportation Needs: Not on file  ?Physical Activity: Not on file  ?Stress: Not on file  ?Social Connections: Not on file  ?Intimate Partner Violence: Not on file  ? ? ?FAMILY HISTORY: ?Family History  ?Problem Relation Age of Onset  ? Heart disease Mother   ? Lung disease Father   ? Cancer Sister   ?     unknown primary  ? Rheum arthritis Sister   ? Kidney Stones Sister   ? COPD Brother   ? ? ?ALLERGIES:  is allergic to lisinopril and carbamazepine. ? ?MEDICATIONS:  ?Current Outpatient Medications  ?Medication Sig Dispense Refill  ? Ascorbic Acid (VITAMIN C) 1000 MG tablet Take 1,000 mg by mouth.    ?  Biotin 10000 MCG TABS Take 10,000 mcg by mouth daily.    ? carvedilol (COREG) 3.125 MG tablet Take 1 tablet (3.125 mg total) by mouth 2 (two) times daily with a meal. 180 tablet 0  ? Cholecalciferol (VITAMIN D3) 125 MCG (5000 UT) TABS Take 5,000 Units by mouth daily.    ? furosemide (LASIX) 20 MG tablet Take 1 tablet (20 mg total) by mouth daily. 30 tablet 0  ? MAGNESIUM PO Take 400 mg by mouth daily.    ? Multiple Vitamins-Minerals (WOMENS 50+ MULTI VITAMIN/MIN PO) Take 1 capsule by mouth daily. ALIVE    ? sacubitril-valsartan (ENTRESTO) 24-26 MG Take 1 tablet by mouth 2 (two) times daily. 60 tablet 0   ? CALCIUM PO Take 1,000 mg by mouth daily.    ? carboxymethylcellul-glycerin (REFRESH RELIEVA) 0.5-0.9 % ophthalmic solution Place 1 drop into both eyes 2 (two) times a week.    ? Polyethylene Glycol 400 (BLINK TEARS OP) Place 1 drop into both eyes daily.    ? ?Current Facility-Administered Medications  ?Medication Dose Route Frequency Provider Last Rate Last Admin  ? sodium chloride flush (NS) 0.9 % injection 3 mL  3 mL Intravenous Q12H Gollan, Kathlene November, MD      ? ? ? ?PHYSICAL EXAMINATION: ?ECOG PERFORMANCE STATUS: 1 - Symptomatic but completely ambulatory ?Vitals:  ? 12/31/21 0918  ?BP: (!) 144/64  ?Pulse: 64  ?Resp: 18  ?Temp: (!) 96.8 ?F (36 ?C)  ? ?Filed Weights  ? 12/31/21 0918  ?Weight: 175 lb 8 oz (79.6 kg)  ? ? ?Physical Exam ?Constitutional:   ?   General: She is not in acute distress. ?HENT:  ?   Head: Normocephalic and atraumatic.  ?Eyes:  ?   General: No scleral icterus. ?Cardiovascular:  ?   Rate and Rhythm: Normal rate and regular rhythm.  ?   Heart sounds: Normal heart sounds.  ?Pulmonary:  ?   Effort: Pulmonary effort is normal. No respiratory distress.  ?   Breath sounds: No wheezing.  ?Abdominal:  ?   General: Bowel sounds are normal. There is no distension.  ?   Palpations: Abdomen is soft.  ?Musculoskeletal:     ?   General: No deformity. Normal range of motion.  ?   Cervical back: Normal range of motion and neck supple.  ?Skin: ?   General: Skin is warm and dry.  ?   Findings: No erythema or rash.  ?Neurological:  ?   Mental Status: She is alert and oriented to person, place, and time. Mental status is at baseline.  ?   Cranial Nerves: No cranial nerve deficit.  ?   Coordination: Coordination normal.  ?Psychiatric:     ?   Mood and Affect: Mood normal.  ? ? ?LABORATORY DATA:  ?I have reviewed the data as listed ?Lab Results  ?Component Value Date  ? WBC 7.7 12/31/2021  ? HGB 13.5 12/31/2021  ? HCT 41.7 12/31/2021  ? MCV 88.7 12/31/2021  ? PLT 259 12/31/2021  ? ?Recent Labs  ?  02/26/21 ?1121  11/05/21 ?1534 11/13/21 ?1015 12/31/21 ?1004 12/31/21 ?1051  ?NA 139   < > 138 136 140  ?K 4.8   < > 4.9 4.8 5.0  ?CL 105   < > 103 106 107  ?CO2 25   < > 21 22 23   ?GLUCOSE 84   < > 94 113* 102*  ?BUN 33*   < > 19 43* 45*  ?CREATININE 1.78*   < >  1.73* 1.72* 1.77*  ?CALCIUM 9.4   < > 9.3 10.1 9.7  ?GFRNONAA  --   --   --  29* 28*  ?PROT 6.6  --   --  8.0  --   ?ALBUMIN 4.1  --   --  4.6  --   ?AST 18  --   --  20  --   ?ALT 11  --   --  13  --   ?ALKPHOS 58  --   --  58  --   ?BILITOT 0.5  --   --  0.7  --   ? < > = values in this interval not displayed.  ? ?Iron/TIBC/Ferritin/ %Sat ?   ?Component Value Date/Time  ? IRON 85 12/31/2021 1004  ? TIBC 461 (H) 12/31/2021 1004  ? FERRITIN 55 12/31/2021 1004  ? IRONPCTSAT 19 12/31/2021 1004  ?  ? ? ?RADIOGRAPHIC STUDIES: ?I have personally reviewed the radiological images as listed and agreed with the findings in the report. ?NM Myocar Multi W/Spect W/Wall Motion / EF ? ?Result Date: 12/28/2021 ?Pharmacological myocardial perfusion imaging study with large region of ischemia in the mid to distal anterior and anteroseptal wall Septal wall motion abnormality consistent with bundle branch block, EF estimated at 65% No EKG changes concerning for ischemia at peak stress or in recovery. Resting EKG with left bundle branch block CT attenuation correction images with three-vessel coronary calcification, notably in the LAD, moderate aortic atherosclerosis in the arch High risk scan Signed, Esmond Plants, MD, Ph.D [facility]   ? ? ? ?ASSESSMENT & PLAN:  ?1. Anemia in stage 4 chronic kidney disease ([place])   ? ?#Anemia, ?Likely secondary to chronic kidney disease stage IV.  Rule out other etiologies ?Check CBC, CMP, ferritin, iron TIBC, multiple myeloma panel, light chain ratio, LDH, smear ? ?.  Available results were reviewed at the time of dictation. ?CBC showed a hemoglobin of 13.8, normal white count, normal platelet count.  Iron panel showed saturation of 19, ferritin of 55.   Normal LDH.  Multiple myeloma panel light chain ratio levels are still pending.   ?Hemoglobin is normal.  No need for erythropoietin replacement yet.  I recommend patient to continue follow-up with primary care p

## 2021-12-31 NOTE — Telephone Encounter (Signed)
Called patient. No answer. Lmtcb.  

## 2022-01-01 ENCOUNTER — Encounter
Admission: RE | Disposition: A | Discharge status: Other Acute Inpatient Hospital | Payer: Self-pay | Source: Home / Self Care | Attending: Internal Medicine

## 2022-01-01 ENCOUNTER — Inpatient Hospital Stay (HOSPITAL_COMMUNITY)
Admission: AD | Admit: 2022-01-01 | Discharge: 2022-01-15 | DRG: 236 | Disposition: A | Discharge status: Home / Self Care | Payer: Medicare PPO | Source: Other Acute Inpatient Hospital | Attending: Surgery | Admitting: Surgery

## 2022-01-01 ENCOUNTER — Ambulatory Visit
Admission: RE | Admit: 2022-01-01 | Discharge: 2022-01-01 | Disposition: A | Discharge status: Other Acute Inpatient Hospital | Payer: Medicare PPO | Attending: Internal Medicine | Admitting: Internal Medicine

## 2022-01-01 ENCOUNTER — Encounter: Payer: Self-pay | Admitting: Internal Medicine

## 2022-01-01 ENCOUNTER — Encounter (HOSPITAL_COMMUNITY): Payer: Self-pay | Admitting: Cardiology

## 2022-01-01 ENCOUNTER — Other Ambulatory Visit: Payer: Self-pay

## 2022-01-01 DIAGNOSIS — I5020 Unspecified systolic (congestive) heart failure: Secondary | ICD-10-CM | POA: Diagnosis not present

## 2022-01-01 DIAGNOSIS — N183 Chronic kidney disease, stage 3 unspecified: Secondary | ICD-10-CM | POA: Diagnosis not present

## 2022-01-01 DIAGNOSIS — I130 Hypertensive heart and chronic kidney disease with heart failure and stage 1 through stage 4 chronic kidney disease, or unspecified chronic kidney disease: Secondary | ICD-10-CM | POA: Diagnosis present

## 2022-01-01 DIAGNOSIS — Z8616 Personal history of COVID-19: Secondary | ICD-10-CM | POA: Diagnosis not present

## 2022-01-01 DIAGNOSIS — Z01810 Encounter for preprocedural cardiovascular examination: Secondary | ICD-10-CM | POA: Diagnosis not present

## 2022-01-01 DIAGNOSIS — Z951 Presence of aortocoronary bypass graft: Secondary | ICD-10-CM

## 2022-01-01 DIAGNOSIS — I428 Other cardiomyopathies: Secondary | ICD-10-CM | POA: Diagnosis not present

## 2022-01-01 DIAGNOSIS — I10 Essential (primary) hypertension: Secondary | ICD-10-CM | POA: Diagnosis not present

## 2022-01-01 DIAGNOSIS — I25118 Atherosclerotic heart disease of native coronary artery with other forms of angina pectoris: Secondary | ICD-10-CM | POA: Diagnosis not present

## 2022-01-01 DIAGNOSIS — Z20822 Contact with and (suspected) exposure to covid-19: Secondary | ICD-10-CM | POA: Diagnosis present

## 2022-01-01 DIAGNOSIS — I2510 Atherosclerotic heart disease of native coronary artery without angina pectoris: Secondary | ICD-10-CM

## 2022-01-01 DIAGNOSIS — I447 Left bundle-branch block, unspecified: Secondary | ICD-10-CM | POA: Insufficient documentation

## 2022-01-01 DIAGNOSIS — I951 Orthostatic hypotension: Secondary | ICD-10-CM | POA: Diagnosis not present

## 2022-01-01 DIAGNOSIS — Z7982 Long term (current) use of aspirin: Secondary | ICD-10-CM

## 2022-01-01 DIAGNOSIS — R0609 Other forms of dyspnea: Secondary | ICD-10-CM | POA: Insufficient documentation

## 2022-01-01 DIAGNOSIS — N184 Chronic kidney disease, stage 4 (severe): Secondary | ICD-10-CM | POA: Diagnosis not present

## 2022-01-01 DIAGNOSIS — Z79899 Other long term (current) drug therapy: Secondary | ICD-10-CM | POA: Diagnosis not present

## 2022-01-01 DIAGNOSIS — Z888 Allergy status to other drugs, medicaments and biological substances status: Secondary | ICD-10-CM

## 2022-01-01 DIAGNOSIS — D631 Anemia in chronic kidney disease: Secondary | ICD-10-CM | POA: Diagnosis not present

## 2022-01-01 DIAGNOSIS — I420 Dilated cardiomyopathy: Secondary | ICD-10-CM | POA: Insufficient documentation

## 2022-01-01 DIAGNOSIS — D72829 Elevated white blood cell count, unspecified: Secondary | ICD-10-CM | POA: Diagnosis not present

## 2022-01-01 DIAGNOSIS — E669 Obesity, unspecified: Secondary | ICD-10-CM | POA: Diagnosis present

## 2022-01-01 DIAGNOSIS — I454 Nonspecific intraventricular block: Secondary | ICD-10-CM | POA: Diagnosis present

## 2022-01-01 DIAGNOSIS — I5022 Chronic systolic (congestive) heart failure: Secondary | ICD-10-CM | POA: Diagnosis present

## 2022-01-01 DIAGNOSIS — Z6834 Body mass index (BMI) 34.0-34.9, adult: Secondary | ICD-10-CM

## 2022-01-01 DIAGNOSIS — Z8249 Family history of ischemic heart disease and other diseases of the circulatory system: Secondary | ICD-10-CM | POA: Diagnosis not present

## 2022-01-01 DIAGNOSIS — E785 Hyperlipidemia, unspecified: Secondary | ICD-10-CM | POA: Diagnosis present

## 2022-01-01 DIAGNOSIS — I200 Unstable angina: Secondary | ICD-10-CM

## 2022-01-01 DIAGNOSIS — M810 Age-related osteoporosis without current pathological fracture: Secondary | ICD-10-CM | POA: Diagnosis present

## 2022-01-01 DIAGNOSIS — I255 Ischemic cardiomyopathy: Secondary | ICD-10-CM | POA: Diagnosis present

## 2022-01-01 DIAGNOSIS — N289 Disorder of kidney and ureter, unspecified: Secondary | ICD-10-CM | POA: Diagnosis not present

## 2022-01-01 DIAGNOSIS — I25110 Atherosclerotic heart disease of native coronary artery with unstable angina pectoris: Principal | ICD-10-CM | POA: Diagnosis present

## 2022-01-01 DIAGNOSIS — D62 Acute posthemorrhagic anemia: Secondary | ICD-10-CM | POA: Diagnosis not present

## 2022-01-01 DIAGNOSIS — R931 Abnormal findings on diagnostic imaging of heart and coronary circulation: Secondary | ICD-10-CM | POA: Diagnosis not present

## 2022-01-01 DIAGNOSIS — Z85828 Personal history of other malignant neoplasm of skin: Secondary | ICD-10-CM

## 2022-01-01 HISTORY — DX: Atherosclerotic heart disease of native coronary artery without angina pectoris: I25.10

## 2022-01-01 HISTORY — DX: Ischemic cardiomyopathy: I25.5

## 2022-01-01 HISTORY — DX: Chronic kidney disease, stage 3 unspecified: N18.30

## 2022-01-01 HISTORY — DX: Left bundle-branch block, unspecified: I44.7

## 2022-01-01 HISTORY — PX: LEFT HEART CATH AND CORONARY ANGIOGRAPHY: CATH118249

## 2022-01-01 LAB — CBC
HCT: 37.3 % (ref 36.0–46.0)
Hemoglobin: 12 g/dL (ref 12.0–15.0)
MCH: 28.8 pg (ref 26.0–34.0)
MCHC: 32.2 g/dL (ref 30.0–36.0)
MCV: 89.4 fL (ref 80.0–100.0)
Platelets: 213 10*3/uL (ref 150–400)
RBC: 4.17 MIL/uL (ref 3.87–5.11)
RDW: 13.7 % (ref 11.5–15.5)
WBC: 6.4 10*3/uL (ref 4.0–10.5)
nRBC: 0 % (ref 0.0–0.2)

## 2022-01-01 LAB — CREATININE, SERUM
Creatinine, Ser: 1.79 mg/dL — ABNORMAL HIGH (ref 0.44–1.00)
GFR, Estimated: 28 mL/min — ABNORMAL LOW (ref >60)

## 2022-01-01 LAB — KAPPA/LAMBDA LIGHT CHAINS
Kappa free light chain: 37 mg/L — ABNORMAL HIGH (ref 3.3–19.4)
Kappa, lambda light chain ratio: 1.5 (ref 0.26–1.65)
Lambda free light chains: 24.7 mg/L (ref 5.7–26.3)

## 2022-01-01 SURGERY — LEFT HEART CATH AND CORONARY ANGIOGRAPHY
Anesthesia: Moderate Sedation | Laterality: Left

## 2022-01-01 SURGERY — LEFT HEART CATH AND CORONARY ANGIOGRAPHY
Anesthesia: Moderate Sedation

## 2022-01-01 MED ORDER — ASPIRIN EC 81 MG PO TBEC
81.0000 mg | DELAYED_RELEASE_TABLET | Freq: Every day | ORAL | Status: DC
  Administered 2022-01-02 – 2022-01-06 (×5): 81 mg via ORAL
  Filled 2022-01-01 (×5): qty 1

## 2022-01-01 MED ORDER — IOHEXOL 300 MG/ML  SOLN
INTRAMUSCULAR | Status: DC | PRN
  Administered 2022-01-01: 30 mL

## 2022-01-01 MED ORDER — ONDANSETRON HCL 4 MG/2ML IJ SOLN: 4.0000 mg | Freq: Four times a day (QID) | INTRAMUSCULAR | Status: DC | PRN

## 2022-01-01 MED ORDER — CARVEDILOL 3.125 MG PO TABS
3.1250 mg | ORAL_TABLET | Freq: Two times a day (BID) | ORAL | Status: DC
  Administered 2022-01-02 – 2022-01-06 (×10): 3.125 mg via ORAL
  Filled 2022-01-01 (×10): qty 1

## 2022-01-01 MED ORDER — ACETAMINOPHEN 325 MG PO TABS: 650.0000 mg | ORAL_TABLET | ORAL | Status: DC | PRN

## 2022-01-01 MED ORDER — VERAPAMIL HCL 2.5 MG/ML IV SOLN
INTRAVENOUS | Status: AC
  Filled 2022-01-01: qty 2

## 2022-01-01 MED ORDER — ASPIRIN 81 MG PO CHEW: 81.0000 mg | CHEWABLE_TABLET | ORAL | Status: DC

## 2022-01-01 MED ORDER — FENTANYL CITRATE (PF) 100 MCG/2ML IJ SOLN
INTRAMUSCULAR | Status: DC | PRN
  Administered 2022-01-01: 12.5 ug via INTRAVENOUS

## 2022-01-01 MED ORDER — FUROSEMIDE 20 MG PO TABS
20.0000 mg | ORAL_TABLET | Freq: Every day | ORAL | Status: DC
  Administered 2022-01-02 – 2022-01-03 (×2): 20 mg via ORAL
  Filled 2022-01-01 (×2): qty 1

## 2022-01-01 MED ORDER — SODIUM CHLORIDE 0.9 % IV SOLN: 250.0000 mL | INTRAVENOUS | Status: DC | PRN

## 2022-01-01 MED ORDER — ASPIRIN 81 MG PO CHEW
CHEWABLE_TABLET | ORAL | Status: AC
  Filled 2022-01-01: qty 1

## 2022-01-01 MED ORDER — SODIUM CHLORIDE 0.9 % WEIGHT BASED INFUSION
3.0000 mL/kg/h | INTRAVENOUS | Status: AC
  Administered 2022-01-01: 3 mL/kg/h via INTRAVENOUS

## 2022-01-01 MED ORDER — SODIUM CHLORIDE 0.9% FLUSH: 3.0000 mL | INTRAVENOUS | Status: DC | PRN

## 2022-01-01 MED ORDER — LIDOCAINE HCL 1 % IJ SOLN
INTRAMUSCULAR | Status: AC
  Filled 2022-01-01: qty 20

## 2022-01-01 MED ORDER — ASPIRIN 81 MG PO CHEW
CHEWABLE_TABLET | ORAL | Status: DC | PRN
  Administered 2022-01-01: 81 mg via ORAL

## 2022-01-01 MED ORDER — SODIUM CHLORIDE 0.9 % WEIGHT BASED INFUSION
1.0000 mL/kg/h | INTRAVENOUS | Status: DC
  Administered 2022-01-01: 1 mL/kg/h via INTRAVENOUS

## 2022-01-01 MED ORDER — MIDAZOLAM HCL 2 MG/2ML IJ SOLN
INTRAMUSCULAR | Status: DC | PRN
  Administered 2022-01-01: .5 mg via INTRAVENOUS

## 2022-01-01 MED ORDER — SACUBITRIL-VALSARTAN 24-26 MG PO TABS
1.0000 | ORAL_TABLET | Freq: Two times a day (BID) | ORAL | Status: DC
  Administered 2022-01-02: 1 via ORAL
  Filled 2022-01-01 (×3): qty 1

## 2022-01-01 MED ORDER — VERAPAMIL HCL 2.5 MG/ML IV SOLN
INTRAVENOUS | Status: DC | PRN
  Administered 2022-01-01: 2.5 mg via INTRA_ARTERIAL

## 2022-01-01 MED ORDER — HEPARIN SODIUM (PORCINE) 1000 UNIT/ML IJ SOLN
INTRAMUSCULAR | Status: AC
  Filled 2022-01-01: qty 10

## 2022-01-01 MED ORDER — SODIUM CHLORIDE 0.9 % IV SOLN: INTRAVENOUS | Status: AC

## 2022-01-01 MED ORDER — NITROGLYCERIN 0.4 MG SL SUBL: 0.4000 mg | SUBLINGUAL_TABLET | SUBLINGUAL | Status: DC | PRN

## 2022-01-01 MED ORDER — FENTANYL CITRATE (PF) 100 MCG/2ML IJ SOLN
INTRAMUSCULAR | Status: AC
  Filled 2022-01-01: qty 2

## 2022-01-01 MED ORDER — LIDOCAINE HCL (PF) 1 % IJ SOLN
INTRAMUSCULAR | Status: DC | PRN
  Administered 2022-01-01: 2 mL

## 2022-01-01 MED ORDER — MIDAZOLAM HCL 2 MG/2ML IJ SOLN
INTRAMUSCULAR | Status: AC
  Filled 2022-01-01: qty 2

## 2022-01-01 MED ORDER — HEPARIN (PORCINE) IN NACL 1000-0.9 UT/500ML-% IV SOLN
INTRAVENOUS | Status: DC | PRN
  Administered 2022-01-01 (×2): 500 mL

## 2022-01-01 MED ORDER — SODIUM CHLORIDE 0.9% FLUSH: 3.0000 mL | Freq: Two times a day (BID) | INTRAVENOUS | Status: DC

## 2022-01-01 MED ORDER — ENOXAPARIN SODIUM 30 MG/0.3ML IJ SOSY
30.0000 mg | PREFILLED_SYRINGE | INTRAMUSCULAR | Status: DC
Start: 2022-01-01 — End: 2022-01-03
  Administered 2022-01-01 – 2022-01-02 (×2): 30 mg via SUBCUTANEOUS
  Filled 2022-01-01 (×3): qty 0.3

## 2022-01-01 MED ORDER — HEPARIN (PORCINE) IN NACL 1000-0.9 UT/500ML-% IV SOLN
INTRAVENOUS | Status: AC
  Filled 2022-01-01: qty 1000

## 2022-01-01 MED ORDER — HEPARIN SODIUM (PORCINE) 1000 UNIT/ML IJ SOLN
INTRAMUSCULAR | Status: DC | PRN
  Administered 2022-01-01: 4000 [IU] via INTRAVENOUS

## 2022-01-01 MED ORDER — HYDRALAZINE HCL 20 MG/ML IJ SOLN: 10.0000 mg | INTRAMUSCULAR | Status: DC | PRN

## 2022-01-01 MED ORDER — ATORVASTATIN CALCIUM 80 MG PO TABS
80.0000 mg | ORAL_TABLET | Freq: Every day | ORAL | Status: DC
  Administered 2022-01-01 – 2022-01-15 (×14): 80 mg via ORAL
  Filled 2022-01-01 (×14): qty 1

## 2022-01-01 MED ORDER — ASPIRIN EC 81 MG PO TBEC: 81.0000 mg | DELAYED_RELEASE_TABLET | Freq: Every day | ORAL | Status: AC

## 2022-01-01 SURGICAL SUPPLY — 14 items
CATH 5FR JL3.5 JR4 ANG PIG MP (CATHETERS) ×1 IMPLANT
CATH INFINITI 5 FR MPA2 (CATHETERS) ×1 IMPLANT
CATH LAUNCHER 5F EBU3.0 (CATHETERS) IMPLANT
CATHETER LAUNCHER 5F EBU3.0 (CATHETERS) ×2
DEVICE RAD TR BAND REGULAR (VASCULAR PRODUCTS) ×1 IMPLANT
DRAPE BRACHIAL (DRAPES) ×1 IMPLANT
GLIDESHEATH SLEND A-KIT 6F 22G (SHEATH) ×1 IMPLANT
GUIDEWIRE INQWIRE 1.5J.035X260 (WIRE) IMPLANT
INQWIRE 1.5J .035X260CM (WIRE) ×2
PACK CARDIAC CATH (CUSTOM PROCEDURE TRAY) ×2 IMPLANT
PROTECTION STATION PRESSURIZED (MISCELLANEOUS) ×2
SET ATX SIMPLICITY (MISCELLANEOUS) ×1 IMPLANT
STATION PROTECTION PRESSURIZED (MISCELLANEOUS) IMPLANT
WIRE HITORQ VERSACORE ST 145CM (WIRE) ×1 IMPLANT

## 2022-01-01 NOTE — H&P (Addendum)
? ?History & Physical  ?  ?Patient ID: Katherine Brooks ?MRN: [id number], DOB/AGE: Brooks 27, 1937  ? ?Admit date: 01/01/2022  ? ? ?Primary Physician: Tower, Wynelle Fanny, MD ?Primary Cardiologist: Katherine Rogue, MD ? ?Patient Profile  ?  ?86  y/o ? w/ a h/o HTN, CKD III, LBBB, and recent finding of cardiomyopathy (EF 30-35%), who presents on tx from [facility] following diagnostic catheterization, which showed severe multivessel CAD. ? ?Past Medical History  ? ? ?Past Medical History:  ?Diagnosis Date  ? Allergy   ? CAD (coronary artery disease)   ? a. 12/2021 MV: mid-dist ant, antsept ischemia; b. 12/2021 Cath: LM 60ost, 90d, LAD 90ost, 95p, 67m D2 30, LCX 90p, RCA 90p.  ? Cancer ([facility]   ? skin  ? CKD (chronic kidney disease), stage III ([place]   ? COVID-19 virus infection 09/2021  ? Facial pain   ? Hypertension   ? Ischemic cardiomyopathy   ? a. 11/2021 Echo: EF 30-35%, glob HK, GrI DD, nl RV fxn, mildly dil LA, triv MR.  ? LBBB (left bundle branch block)   ?  ?Past Surgical History:  ?Procedure Laterality Date  ? salivary gland N/A 12/2012  ?  ? ?Allergies ? ?Allergies  ?Allergen Reactions  ? Lisinopril Other (See Comments)  ?  "passed out" ?Other reaction(s): Other (See Comments), Other (See Comments) ?"passed out" ?"I passed out"  ? Carbamazepine Rash  ? ? ?History of Present Illness  ?  ?86 y/o ? w/ a h/o HTN, CKD III, and LBBB.  She established care w/ Dr. GRockey Brooks 10/2021 in the setting of persistent DOE following COVID infection earlier in the year.  Echo was ordered, and this showed LV dysfxn w/ an EF of 30-35%, glob HK, GrI diast dysfxn, nl RV fxn, and triv MR.  In light of these findings, she was placed on carvedilol and entresto, and she underwent stress testing on 4/21, which was notable for a large region of mid to distal anterior and anteroseptal ischemia.  Given this finding, along w/ LV dysfxn, decision was made to pursue diagnostic cath today.  [facility] and has show severe  multivessel CAD including 90% distal LM/ostial LAD stenosis along w/ 90% proximal dzs involving the LCX and RCA.  In this setting, decision was made to admit [facility].  Currently, she is symptom free @ rest. ? ?Home Medications  ?  ?Prior to Admission medications   ?Medication Sig Start Date Katherine Brooks Date Taking? Authorizing Provider  ?Ascorbic Acid (VITAMIN C) 1000 MG tablet Take 1,000 mg by mouth.    [provider]  ?aspirin EC 81 MG tablet Take 1 tablet (81 mg total) by mouth daily. Swallow whole. 01/01/22 01/01/23  Katherine Brooks, CHarrell Gave MD  ?Biotin 10000 MCG TABS Take 10,000 mcg by mouth daily.    [provider]  ?CALCIUM PO Take 1,000 mg by mouth daily.    [provider]  ?carboxymethylcellul-glycerin (REFRESH RELIEVA) 0.5-0.9 % ophthalmic solution Place 1 drop into both eyes 2 (two) times a week.    [provider]  ?carvedilol (COREG) 3.125 MG tablet Take 1 tablet (3.125 mg total) by mouth 2 (two) times daily with a meal. 12/10/21   Gollan, TKathlene November MD  ?Cholecalciferol (VITAMIN D3) 125 MCG (5000 UT) TABS Take 5,000 Units by mouth daily.    [provider]  ?furosemide (LASIX) 20 MG tablet Take 1  tablet (20 mg total) by mouth daily. 11/05/21 01/01/22  Katherine Brooks  ?MAGNESIUM PO Take 400 mg by mouth daily.    [provider]  ?Multiple Vitamins-Minerals (WOMENS 50+ MULTI VITAMIN/MIN PO) Take 1 capsule by mouth daily. ALIVE    [provider]  ?Polyethylene Glycol 400 (BLINK TEARS OP) Place 1 drop into both eyes daily.    [provider]  ?sacubitril-valsartan (ENTRESTO) 24-26 MG Take 1 tablet by mouth 2 (two) times daily. 12/10/21   Katherine Merritts, MD  ? ? ?Family History  ?  ?Family History  ?Problem Relation Age of Onset  ? Heart disease Mother   ? Lung disease Father   ? Cancer Sister   ?     unknown primary  ? Rheum arthritis Sister   ? Kidney Stones Sister   ? COPD Brother    ? ?She indicated that her mother is deceased. She indicated that her father is deceased. She indicated that her sister is deceased. She indicated that her brother is deceased. ? ? ?Social History  ?  ?Social History  ? ?Socioeconomic History  ? Marital status: Married  ?  Spouse name: Katherine Brooks  ? Number of children: 3  ? Years of education: Not on file  ? Highest education level: Some college, no degree  ?Occupational History  ?  Comment: Network engineer, school system  ?Tobacco Use  ? Smoking status: Never  ? Smokeless tobacco: Never  ?Vaping Use  ? Vaping Use: Never used  ?Substance and Sexual Activity  ? Alcohol use: No  ? Drug use: No  ? Sexual activity: Yes  ?Other Topics Concern  ? Not on file  ?Social History Narrative  ? Lives with husband  ? ?Social Determinants of Health  ? ?Financial Resource Strain: Not on file  ?Food Insecurity: Not on file  ?Transportation Needs: Not on file  ?Physical Activity: Not on file  ?Stress: Not on file  ?Social Connections: Not on file  ?Intimate Partner Violence: Not on file  ?  ? ?Review of Systems  ?  ?General:  No chills, fever, night sweats or weight changes.  ?Cardiovascular:  No chest pain, +++ dyspnea on exertion, edema, orthopnea, palpitations, paroxysmal nocturnal dyspnea. ?Dermatological: No rash, lesions/masses ?Respiratory: No cough, +++ dyspnea ?Urologic: No hematuria, dysuria ?Abdominal:   No nausea, vomiting, diarrhea, bright red blood per rectum, melena, or hematemesis ?Neurologic:  No visual changes, wkns, changes in mental status. ?All other systems reviewed and are otherwise negative except as noted above. ? ?Physical Exam  ?  ?Afebrile, BP 98/46, HR  59, Resp 19, SpO2 98%. ?General: Pleasant, NAD ?Psych: Normal affect. ?Neuro: Alert and oriented X 3. Moves all extremities spontaneously. ?HEENT: Normal  ?Neck: Supple without bruits or JVD. ?Lungs:  Resp regular and unlabored, CTA. ?Heart: RRR no s3, s4, or murmurs. ?Abdomen: Soft, non-tender, non-distended, BS +  x 4.  ?Extremities: No clubbing, cyanosis or edema. DP/PT 2+, Radials 2+ and equal bilaterally. ? ?Labs  ?  ? ? ?Lab Results  ?Component Value Date  ? WBC 7.7 12/31/2021  ? HGB 13.5 12/31/2021  ? HCT 41.7 12/31/2021  ? MCV 88.7 12/31/2021  ? PLT 259 12/31/2021  ?  ?Recent Labs  ?Lab 12/31/21 ?1004 12/31/21 ?1051  ?NA 136 140  ?K 4.8 5.0  ?CL 106 107  ?CO2 22 23  ?BUN 43* 45*  ?CREATININE 1.72* 1.77*  ?CALCIUM 10.1 9.7  ?PROT 8.0  --   ?BILITOT 0.7  --   ?  ALKPHOS 58  --   ?ALT 13  --   ?AST 20  --   ?GLUCOSE 113* 102*  ? ?Lab Results  ?Component Value Date  ? CHOL 197 02/26/2021  ? HDL 63.20 02/26/2021  ? LDLCALC 119 (H) 02/26/2021  ? TRIG 76.0 02/26/2021  ? ?ProBNP ?   ?Component Value Date/Time  ? PROBNP 884.0 (H) 11/05/2021 1534  ?  ?Radiology Studies  ? ?------------------ ? ?ECG & Cardiac Imaging  ?  ?11/06/2021: RSR, 80, LBBB - personally reviewed. ? ?Assessment & Plan  ?  ?1.  CAD/Unstable Angina:  Pt w/ persistent DOE over the past few months in the absence of chest pain.  Echo w/ LV dysfxn and EF of 30-35% in early March, followed by stress testing last week, which was notable for a large area of mid-distal anterior and anteroseptal ischemia.  Cath today showed severe multivessel CAD including 90% distal LM/ostial LAD, and 90% proximal dzs involving the LCX and RCA.  She is currently symptom free @ rest.  In light of severe, multivessel disease, she has been transferred to [facility] for [facility] surgical evaluation.  Cont ? blocker and ASA.  I will add high potency statin rx. ? ?2.  Chronic HFrEF/ICM:  EF 30-35% by recent echo w/ GrI DD.  Stable @ rest.  LVEDP on upper Patrycja Mumpower of nl @ 15 mmHg.  Cont ? blocker and entresto.  BP soft today.  Consider SGLT2i if renal fxn stable and pressures improve.  K trends high - may not be best candidate for MRA. ? ?3.  Essential HTN:  BP soft. Follow. ? ?4.  HL:  LDL 119 last Summer.  Adding high potency statin rx in the setting of severe CAD. ? ?5.  CKD III:  BUN/Creat stable pre  cath @ 45/1.77.  Contrast limited to 30 ml w/ cath today.  Gentle hydration provided post-cath. Follow up bmet in AM. ? ?Risk Assessment/Risk Scores:  ?   ?  ? ?New York Heart Association (NYHA) Functional Class

## 2022-01-01 NOTE — Interval H&P Note (Signed)
History and Physical Interval Note: ? ?01/01/2022 ?10:45 AM ? ?Katherine Brooks  has presented today for surgery, with the diagnosis of HFrEF, cardiomyopathy.  The various methods of treatment have been discussed with the patient and family. After consideration of risks, benefits and other options for treatment, the patient has consented to  Procedure(s): ?LEFT HEART CATH AND CORONARY ANGIOGRAPHY (Left) as a surgical intervention.  The patient's history has been reviewed, patient examined, no change in status, stable for surgery.  I have reviewed the patient's chart and labs.  Questions were answered to the patient's satisfaction.   ? ?Cath Lab Visit (complete for each Cath Lab visit) ? ?Clinical Evaluation Leading to the Procedure:  ? ?ACS: No. ? ?Non-ACS:   ? ?Anginal/Heart Failure Classification: NYHA class III ? ?Anti-ischemic medical therapy: Minimal Therapy (1 class of medications) ? ?Non-Invasive Test Results: High-risk stress test findings: cardiac mortality >3%/year ? ?Prior CABG: No previous CABG ? ?Katherine Brooks ? ? ?

## 2022-01-02 ENCOUNTER — Inpatient Hospital Stay (HOSPITAL_COMMUNITY): Payer: Medicare PPO

## 2022-01-02 ENCOUNTER — Encounter: Payer: Self-pay | Admitting: Internal Medicine

## 2022-01-02 DIAGNOSIS — Z8616 Personal history of COVID-19: Secondary | ICD-10-CM

## 2022-01-02 DIAGNOSIS — I10 Essential (primary) hypertension: Secondary | ICD-10-CM | POA: Diagnosis not present

## 2022-01-02 DIAGNOSIS — R931 Abnormal findings on diagnostic imaging of heart and coronary circulation: Secondary | ICD-10-CM | POA: Diagnosis not present

## 2022-01-02 DIAGNOSIS — I2510 Atherosclerotic heart disease of native coronary artery without angina pectoris: Secondary | ICD-10-CM | POA: Diagnosis not present

## 2022-01-02 DIAGNOSIS — N183 Chronic kidney disease, stage 3 unspecified: Secondary | ICD-10-CM | POA: Diagnosis not present

## 2022-01-02 DIAGNOSIS — I428 Other cardiomyopathies: Secondary | ICD-10-CM | POA: Diagnosis not present

## 2022-01-02 LAB — CBC
HCT: 35.5 % — ABNORMAL LOW (ref 36.0–46.0)
Hemoglobin: 11.5 g/dL — ABNORMAL LOW (ref 12.0–15.0)
MCH: 28.8 pg (ref 26.0–34.0)
MCHC: 32.4 g/dL (ref 30.0–36.0)
MCV: 89 fL (ref 80.0–100.0)
Platelets: 213 10*3/uL (ref 150–400)
RBC: 3.99 MIL/uL (ref 3.87–5.11)
RDW: 13.8 % (ref 11.5–15.5)
WBC: 7.3 10*3/uL (ref 4.0–10.5)
nRBC: 0 % (ref 0.0–0.2)

## 2022-01-02 LAB — BASIC METABOLIC PANEL
Anion gap: 6 (ref 5–15)
BUN: 31 mg/dL — ABNORMAL HIGH (ref 8–23)
CO2: 22 mmol/L (ref 22–32)
Calcium: 9 mg/dL (ref 8.9–10.3)
Chloride: 110 mmol/L (ref 98–111)
Creatinine, Ser: 1.71 mg/dL — ABNORMAL HIGH (ref 0.44–1.00)
GFR, Estimated: 29 mL/min — ABNORMAL LOW (ref >60)
Glucose, Bld: 81 mg/dL (ref 70–99)
Potassium: 4.4 mmol/L (ref 3.5–5.1)
Sodium: 138 mmol/L (ref 135–145)

## 2022-01-02 NOTE — Consult Note (Addendum)
? ?   ?[address] ?      [place] [address] ?            [phone]       ? ?[name] ?[facility] Record #[id number] ?Date of Birth: [birth year] ? ?Referring: No ref. provider found ?Primary Care: Tower, Wynelle Fanny, MD ?Primary Cardiologist:Timothy Rockey Situ, MD ? ?Chief Complaint:   Dyspnea on exertion ? ?History of Present Illness:    We are asked to see this 86 year old female in cardiothoracic surgical consultation for consideration of coronary artery surgical revascularization.  The patient has multiple cardiac risk factors including hypertension, left bundle branch block, and recent finding of ischemic cardiomyopathy with ejection fraction 30 to 35%.  Other significant medical comorbidities include stage III chronic kidney disease.  Her most recent BUN and creatinine today are 31/1.71.  This appears to be around her recent baseline.  Patient developed persistent dyspnea on exertion following a COVID infection earlier this year.  Echocardiogram revealed LV dysfunction with decreased EF as described above.  She was started on Coreg and Entresto and subsequently underwent stress testing on 12/28/2021 which showed a large region of mid and distal anterior and anteroseptal ischemia.  She was felt to be appropriate to proceed with diagnostic cardiac catheterization which was done and showed severe multivessel coronary artery disease including 90% distal left main/ostial LAD stenosis along with proximal 90% disease in the left circumflex and RCA.  Chest x-ray reveals significant calcification in the aorta as well as findings consistent with possible interstitial lung disease.  She is notably active and functional.  We are being asked to consider for multivessel CABG. ? ? ? ?Current Activity/ Functional Status: ?Patient is independent with mobility/ambulation, transfers, ADL's, IADL's. ?  ?Zubrod Score: ?At the time of surgery this patient?s most appropriate activity status/level should be  described as: ?'[]'$     0    Normal activity, no symptoms ?'[x]'$     1    Restricted in physical strenuous activity but ambulatory, able to do out light work ?'[]'$     2    Ambulatory and capable of self care, unable to do work activities, up and about                 more than 50%  Of the time                            ?'[]'$     3    Only limited self care, in bed greater than 50% of waking hours ?'[]'$     4    Completely disabled, no self care, confined to bed or chair ?'[]'$     5    Moribund ? ?Past Medical History:  ?Diagnosis Date  ? Allergy   ? CAD (coronary artery disease)   ? a. 12/2021 MV: mid-dist ant, antsept ischemia; b. 12/2021 Cath: LM 60ost, 90d, LAD 90ost, 95p, 101m D2 30, LCX 90p, RCA 90p.  ? Cancer ([facility]   ? skin  ? CKD (chronic kidney disease), stage III ([place]   ? COVID-19 virus infection 09/2021  ? Facial pain   ? Hypertension   ? Ischemic cardiomyopathy   ? a. 11/2021 Echo: EF 30-35%, glob HK, GrI DD, nl RV fxn, mildly dil LA, triv MR.  ? LBBB (left bundle branch block)   ? ? ?Past Surgical History:  ?Procedure Laterality Date  ? LEFT HEART CATH AND CORONARY ANGIOGRAPHY Left  01/01/2022  ? Procedure: LEFT HEART CATH AND CORONARY ANGIOGRAPHY;  Surgeon: Nelva Bush, MD;  Location: [facility];  Service: Cardiovascular;  Laterality: Left;  ? salivary gland N/A 12/2012  ? ? ?Social History  ? ?Tobacco Use  ?Smoking Status Never  ? Passive exposure: Never  ?Smokeless Tobacco Never  ?  ?Social History  ? ?Substance and Sexual Activity  ?Alcohol Use No  ? ? ? ?Allergies  ?Allergen Reactions  ? Lisinopril Other (See Comments)  ?  "passed out" ?Other reaction(s): Other (See Comments), Other (See Comments) ?"passed out" ?"I passed out"  ? Carbamazepine Rash  ? ? ?Current Facility-Administered Medications  ?Medication Dose Route Frequency Provider Last Rate Last Admin  ? acetaminophen (TYLENOL) tablet 650 mg  650 mg Oral Q4H PRN Barrett, Evelene Croon, PA-C      ? aspirin EC tablet 81 mg  81 mg Oral Daily Barrett,  Rhonda G, PA-C   81 mg at 01/02/22 0809  ? atorvastatin (LIPITOR) tablet 80 mg  80 mg Oral Daily Barrett, Rhonda G, PA-C   80 mg at 01/02/22 0809  ? carvedilol (COREG) tablet 3.125 mg  3.125 mg Oral BID WC Barrett, Rhonda G, PA-C   3.125 mg at 01/02/22 0809  ? enoxaparin (LOVENOX) injection 30 mg  30 mg Subcutaneous Q24H Barrett, Rhonda G, PA-C   30 mg at 01/01/22 2147  ? furosemide (LASIX) tablet 20 mg  20 mg Oral Daily Barrett, Rhonda G, PA-C   20 mg at 01/02/22 6789  ? nitroGLYCERIN (NITROSTAT) SL tablet 0.4 mg  0.4 mg Sublingual Q5 Min x 3 PRN Barrett, Rhonda G, PA-C      ? ondansetron (ZOFRAN) injection 4 mg  4 mg Intravenous Q6H PRN Barrett, Rhonda G, PA-C      ? sacubitril-valsartan (ENTRESTO) 24-26 mg per tablet  1 tablet Oral BID Barrett, Evelene Croon, PA-C      ? ? ?Facility-Administered Medications Prior to Admission  ?Medication Dose Route Frequency Provider Last Rate Last Admin  ? sodium chloride flush (NS) 0.9 % injection 3 mL  3 mL Intravenous Q12H Gollan, Kathlene November, MD      ? ?Medications Prior to Admission  ?Medication Sig Dispense Refill Last Dose  ? Ascorbic Acid (VITAMIN C) 1000 MG tablet Take 1,000 mg by mouth.     ? aspirin EC 81 MG tablet Take 1 tablet (81 mg total) by mouth daily. Swallow whole.     ? Biotin 10000 MCG TABS Take 10,000 mcg by mouth daily.     ? CALCIUM PO Take 1,000 mg by mouth daily.     ? carboxymethylcellul-glycerin (REFRESH RELIEVA) 0.5-0.9 % ophthalmic solution Place 1 drop into both eyes 2 (two) times a week.     ? carvedilol (COREG) 3.125 MG tablet Take 1 tablet (3.125 mg total) by mouth 2 (two) times daily with a meal. 180 tablet 0   ? Cholecalciferol (VITAMIN D3) 125 MCG (5000 UT) TABS Take 5,000 Units by mouth daily.     ? furosemide (LASIX) 20 MG tablet Take 1 tablet (20 mg total) by mouth daily. 30 tablet 0   ? MAGNESIUM PO Take 400 mg by mouth daily.     ? Multiple Vitamins-Minerals (WOMENS 50+ MULTI VITAMIN/MIN PO) Take 1 capsule by mouth daily. ALIVE     ?  Polyethylene Glycol 400 (BLINK TEARS OP) Place 1 drop into both eyes daily.     ? sacubitril-valsartan (ENTRESTO) 24-26 MG Take 1 tablet by mouth 2 (two)  times daily. 60 tablet 0   ? ? ?Family History  ?Problem Relation Age of Onset  ? Heart disease Mother   ? Lung disease Father   ? Cancer Sister   ?     unknown primary  ? Rheum arthritis Sister   ? Kidney Stones Sister   ? COPD Brother   ? ? ? ?Review of Systems:  ? ?Review of Systems  ?Constitutional:  Positive for malaise/fatigue.  ?HENT: Negative.    ?Eyes:   ?     Positive for cataracts  ?Respiratory:  Positive for shortness of breath. Negative for cough, hemoptysis, sputum production and wheezing.   ?Cardiovascular:  Positive for leg swelling. Negative for chest pain, palpitations, orthopnea, claudication and PND.  ?Gastrointestinal: Negative.   ?Genitourinary: Negative.   ?Musculoskeletal:  Positive for back pain.  ?Skin: Negative.   ?Neurological: Negative.   ?Endo/Heme/Allergies: Negative.   ?Psychiatric/Behavioral: Negative.    ?   ? ?Physical Exam: ?BP (!) 112/46   Pulse 61   Temp 98.2 ?F (36.8 ?C) (Oral)   Resp 15   Ht 5' (1.524 m)   Wt 78.7 kg   SpO2 99%   BMI 33.86 kg/m?  ? ? ?General appearance: alert, cooperative, appears stated age, and no distress ?Head: Normocephalic, without obvious abnormality, atraumatic ?Neck: no adenopathy, no carotid bruit, no JVD, supple, symmetrical, trachea midline, and thyroid not enlarged, symmetric, no tenderness/mass/nodules ?Lymph nodes: Cervical, supraclavicular, and axillary nodes normal. ?Resp: clear to auscultation bilaterally ?Back: symmetric, no curvature. ROM normal. No CVA tenderness. ?Cardio: regular rate and rhythm, S1, S2 normal, no murmur, click, rub or gallop ?GI: soft, non-tender; bowel sounds normal; no masses,  no organomegaly ?Extremities: Notable for varicosities, no clubbing cyanosis or edema, she has palpable pulses throughout ?Neurologic: Grossly normal ? ?Diagnostic Studies & Laboratory  data: ?   ? ?   ECHOCARDIOGRAM REPORT    ? ?   ? ?Patient Name:   Katherine Brooks Date of Exam: 11/07/2021  ?Medical Rec #:  [id number]     Height:       60.0 in  ?Accession #:    [id number]    Weight:       179.1 lb  ?Date of B

## 2022-01-02 NOTE — Progress Notes (Signed)
?  Transition of Care (TOC) Screening Note ? ? ?Patient Details  ?Name: Katherine Brooks ?Date of Birth: 11/09/1935 ? ? ?Transition of Care (TOC) CM/SW Contact:    ?Milas Gain, LCSWA ?Phone Number: ?01/02/2022, 3:11 PM ? ? ? ?Transition of Care Department [facility]) has reviewed patient and no TOC needs have been identified at this time. We will continue to monitor patient advancement through interdisciplinary progression rounds. If new patient transition needs arise, please place a TOC consult. ?  ?

## 2022-01-02 NOTE — H&P (View-Only) (Signed)
? ?   ?[address] ?      [place] [address] ?            [phone]       ? ?Katherine Brooks ?[place] Record #[id number] ?Date of Birth: Feb 12, 1936 ? ?Referring: No ref. provider found ?Primary Care: Tower, Wynelle Fanny, MD ?Primary Cardiologist:Timothy Rockey Situ, MD ? ?Chief Complaint:   Dyspnea on exertion ? ?History of Present Illness:    We are asked to see this 86 year old female in cardiothoracic surgical consultation for consideration of coronary artery surgical revascularization.  The patient has multiple cardiac risk factors including hypertension, left bundle branch block, and recent finding of ischemic cardiomyopathy with ejection fraction 30 to 35%.  Other significant medical comorbidities include stage III chronic kidney disease.  Her most recent BUN and creatinine today are 31/1.71.  This appears to be around her recent baseline.  Patient developed persistent dyspnea on exertion following a COVID infection earlier this year.  Echocardiogram revealed LV dysfunction with decreased EF as described above.  She was started on Coreg and Entresto and subsequently underwent stress testing on 12/28/2021 which showed a large region of mid and distal anterior and anteroseptal ischemia.  She was felt to be appropriate to proceed with diagnostic cardiac catheterization which was done and showed severe multivessel coronary artery disease including 90% distal left main/ostial LAD stenosis along with proximal 90% disease in the left circumflex and RCA.  Chest x-ray reveals significant calcification in the aorta as well as findings consistent with possible interstitial lung disease.  She is notably active and functional.  We are being asked to consider for multivessel CABG. ? ? ? ?Current Activity/ Functional Status: ?Patient is independent with mobility/ambulation, transfers, ADL's, IADL's. ?  ?Zubrod Score: ?At the time of surgery this patient?s most appropriate activity status/level should be  described as: ?'[]'$     0    Normal activity, no symptoms ?'[x]'$     1    Restricted in physical strenuous activity but ambulatory, able to do out light work ?'[]'$     2    Ambulatory and capable of self care, unable to do work activities, up and about                 more than 50%  Of the time                            ?'[]'$     3    Only limited self care, in bed greater than 50% of waking hours ?'[]'$     4    Completely disabled, no self care, confined to bed or chair ?'[]'$     5    Moribund ? ?Past Medical History:  ?Diagnosis Date  ? Allergy   ? CAD (coronary artery disease)   ? a. 12/2021 MV: mid-dist ant, antsept ischemia; b. 12/2021 Cath: LM 60ost, 90d, LAD 90ost, 95p, 84m D2 30, LCX 90p, RCA 90p.  ? Cancer ([facility]   ? skin  ? CKD (chronic [place] disease), stage III ([place]   ? COVID-19 virus infection 09/2021  ? Facial pain   ? Hypertension   ? Ischemic cardiomyopathy   ? a. 11/2021 Echo: EF 30-35%, glob HK, GrI DD, nl RV fxn, mildly dil LA, triv MR.  ? LBBB (left bundle branch block)   ? ? ?Past Surgical History:  ?Procedure Laterality Date  ? LEFT HEART CATH AND CORONARY ANGIOGRAPHY Left  01/01/2022  ? Procedure: LEFT HEART CATH AND CORONARY ANGIOGRAPHY;  Surgeon: Nelva Bush, MD;  Location: [facility];  Service: Cardiovascular;  Laterality: Left;  ? salivary gland N/A 12/2012  ? ? ?Social History  ? ?Tobacco Use  ?Smoking Status Never  ? Passive exposure: Never  ?Smokeless Tobacco Never  ?  ?Social History  ? ?Substance and Sexual Activity  ?Alcohol Use No  ? ? ? ?Allergies  ?Allergen Reactions  ? Lisinopril Other (See Comments)  ?  "passed out" ?Other reaction(s): Other (See Comments), Other (See Comments) ?"passed out" ?"I passed out"  ? Carbamazepine Rash  ? ? ?Current Facility-Administered Medications  ?Medication Dose Route Frequency Provider Last Rate Last Admin  ? acetaminophen (TYLENOL) tablet 650 mg  650 mg Oral Q4H PRN Barrett, Evelene Croon, PA-C      ? aspirin EC tablet 81 mg  81 mg Oral Daily Barrett,  Rhonda G, PA-C   81 mg at 01/02/22 0809  ? atorvastatin (LIPITOR) tablet 80 mg  80 mg Oral Daily Barrett, Rhonda G, PA-C   80 mg at 01/02/22 0809  ? carvedilol (COREG) tablet 3.125 mg  3.125 mg Oral BID WC Barrett, Rhonda G, PA-C   3.125 mg at 01/02/22 0809  ? enoxaparin (LOVENOX) injection 30 mg  30 mg Subcutaneous Q24H Barrett, Rhonda G, PA-C   30 mg at 01/01/22 2147  ? furosemide (LASIX) tablet 20 mg  20 mg Oral Daily Barrett, Rhonda G, PA-C   20 mg at 01/02/22 5009  ? nitroGLYCERIN (NITROSTAT) SL tablet 0.4 mg  0.4 mg Sublingual Q5 Min x 3 PRN Barrett, Rhonda G, PA-C      ? ondansetron (ZOFRAN) injection 4 mg  4 mg Intravenous Q6H PRN Barrett, Rhonda G, PA-C      ? sacubitril-valsartan (ENTRESTO) 24-26 mg per tablet  1 tablet Oral BID Barrett, Evelene Croon, PA-C      ? ? ?Facility-Administered Medications Prior to Admission  ?Medication Dose Route Frequency Provider Last Rate Last Admin  ? sodium chloride flush (NS) 0.9 % injection 3 mL  3 mL Intravenous Q12H Gollan, Kathlene November, MD      ? ?Medications Prior to Admission  ?Medication Sig Dispense Refill Last Dose  ? Ascorbic Acid (VITAMIN C) 1000 MG tablet Take 1,000 mg by mouth.     ? aspirin EC 81 MG tablet Take 1 tablet (81 mg total) by mouth daily. Swallow whole.     ? Biotin 10000 MCG TABS Take 10,000 mcg by mouth daily.     ? CALCIUM PO Take 1,000 mg by mouth daily.     ? carboxymethylcellul-glycerin (REFRESH RELIEVA) 0.5-0.9 % ophthalmic solution Place 1 drop into both eyes 2 (two) times a week.     ? carvedilol (COREG) 3.125 MG tablet Take 1 tablet (3.125 mg total) by mouth 2 (two) times daily with a meal. 180 tablet 0   ? Cholecalciferol (VITAMIN D3) 125 MCG (5000 UT) TABS Take 5,000 Units by mouth daily.     ? furosemide (LASIX) 20 MG tablet Take 1 tablet (20 mg total) by mouth daily. 30 tablet 0   ? MAGNESIUM PO Take 400 mg by mouth daily.     ? Multiple Vitamins-Minerals (WOMENS 50+ MULTI VITAMIN/MIN PO) Take 1 capsule by mouth daily. ALIVE     ?  Polyethylene Glycol 400 (BLINK TEARS OP) Place 1 drop into both eyes daily.     ? sacubitril-valsartan (ENTRESTO) 24-26 MG Take 1 tablet by mouth 2 (two)  times daily. 60 tablet 0   ? ? ?Family History  ?Problem Relation Age of Onset  ? Heart disease Mother   ? Lung disease Father   ? Cancer Sister   ?     unknown primary  ? Rheum arthritis Sister   ? Kidney Stones Sister   ? COPD Brother   ? ? ? ?Review of Systems:  ? ?Review of Systems  ?Constitutional:  Positive for malaise/fatigue.  ?HENT: Negative.    ?Eyes:   ?     Positive for cataracts  ?Respiratory:  Positive for shortness of breath. Negative for cough, hemoptysis, sputum production and wheezing.   ?Cardiovascular:  Positive for leg swelling. Negative for chest pain, palpitations, orthopnea, claudication and PND.  ?Gastrointestinal: Negative.   ?Genitourinary: Negative.   ?Musculoskeletal:  Positive for back pain.  ?Skin: Negative.   ?Neurological: Negative.   ?Endo/Heme/Allergies: Negative.   ?Psychiatric/Behavioral: Negative.    ?   ? ?Physical Exam: ?BP (!) 112/46   Pulse 61   Temp 98.2 ?F (36.8 ?C) (Oral)   Resp 15   Ht 5' (1.524 m)   Wt 78.7 kg   SpO2 99%   BMI 33.86 kg/m?  ? ? ?General appearance: alert, cooperative, appears stated age, and no distress ?Head: Normocephalic, without obvious abnormality, atraumatic ?Neck: no adenopathy, no carotid bruit, no JVD, supple, symmetrical, trachea midline, and thyroid not enlarged, symmetric, no tenderness/mass/nodules ?Lymph nodes: Cervical, supraclavicular, and axillary nodes normal. ?Resp: clear to auscultation bilaterally ?Back: symmetric, no curvature. ROM normal. No CVA tenderness. ?Cardio: regular rate and rhythm, S1, S2 normal, no murmur, click, rub or gallop ?GI: soft, non-tender; bowel sounds normal; no masses,  no organomegaly ?Extremities: Notable for varicosities, no clubbing cyanosis or edema, she has palpable pulses throughout ?Neurologic: Grossly normal ? ?Diagnostic Studies & Laboratory  data: ?   ? ?   ECHOCARDIOGRAM REPORT    ? ?   ? ?Patient Name:   Katherine Brooks Date of Exam: 11/07/2021  ?Medical Rec #:  [id number]     Height:       60.0 in  ?Accession #:    [id number]    Weight:       179.1 lb  ?Date of B

## 2022-01-02 NOTE — Progress Notes (Addendum)
? ?Progress Note ? ?Patient Name: Katherine Brooks ?Date of Encounter: 01/02/2022 ? ?[facility] Cardiologist: Ida Rogue, MD  ? ?Subjective  ? ?No chest pain, breathing is stable.  ? ?Inpatient Medications  ?  ?Scheduled Meds: ? aspirin EC  81 mg Oral Daily  ? atorvastatin  80 mg Oral Daily  ? carvedilol  3.125 mg Oral BID WC  ? enoxaparin (LOVENOX) injection  30 mg Subcutaneous Q24H  ? furosemide  20 mg Oral Daily  ? sacubitril-valsartan  1 tablet Oral BID  ? ?Continuous Infusions: ? ?PRN Meds: ?acetaminophen, nitroGLYCERIN, ondansetron (ZOFRAN) IV  ? ?Vital Signs  ?  ?Vitals:  ? 01/01/22 2027 01/02/22 0040 01/02/22 0357 01/02/22 0809  ?BP: (!) 100/50 107/79 (!) 107/50 (!) 112/46  ?Pulse: 64 71 (!) 55 61  ?Resp: '19 18 15   '$ ?Temp: 98.4 ?F (36.9 ?C) 98.4 ?F (36.9 ?C) 98.2 ?F (36.8 ?C)   ?TempSrc: Oral Oral Oral   ?SpO2:  99% 99%   ?Weight:   78.7 kg   ?Height:      ? ?No intake or output data in the 24 hours ending 01/02/22 1002 ? ?  01/02/2022  ?  3:57 AM 01/01/2022  ?  6:00 PM 01/01/2022  ?  7:44 AM  ?Last 3 Weights  ?Weight (lbs) 173 lb 6.4 oz 174 lb 174 lb  ?Weight (kg) 78.654 kg 78.926 kg 78.926 kg  ?   ? ?Telemetry  ?  ?Sinus Bradycardia - Personally Reviewed ? ?ECG  ?  ?No new tracing ? ?Physical Exam  ? ?GEN: No acute distress.   ?Neck: No JVD ?Cardiac: RRR, no murmurs, rubs, or gallops.  ?Respiratory: Clear to auscultation bilaterally. ?GI: Soft, nontender, non-distended  ?MS: No edema; No deformity. Right radial cath site stable.  ?Neuro:  Nonfocal  ?Psych: Normal affect  ? ?Labs  ?  ?High Sensitivity Troponin:  No results for input(s): TROPONINIHS in the last 720 hours.   ?Chemistry ?Recent Labs  ?Lab 12/31/21 ?1004 12/31/21 ?1051 01/01/22 ?2706 01/02/22 ?0346  ?NA 136 140  --  138  ?K 4.8 5.0  --  4.4  ?CL 106 107  --  110  ?CO2 22 23  --  22  ?GLUCOSE 113* 102*  --  81  ?BUN 43* 45*  --  31*  ?CREATININE 1.72* 1.77* 1.79* 1.71*  ?CALCIUM 10.1 9.7  --  9.0  ?PROT 8.0  --   --   --   ?ALBUMIN 4.6  --    --   --   ?AST 20  --   --   --   ?ALT 13  --   --   --   ?ALKPHOS 58  --   --   --   ?BILITOT 0.7  --   --   --   ?GFRNONAA 29* 28* 28* 29*  ?ANIONGAP 8 10  --  6  ?  ?Lipids No results for input(s): CHOL, TRIG, HDL, LABVLDL, LDLCALC, CHOLHDL in the last 168 hours.  ?Hematology ?Recent Labs  ?Lab 12/31/21 ?1051 01/01/22 ?2376 01/02/22 ?0346  ?WBC 7.7 6.4 7.3  ?RBC 4.70 4.17 3.99  ?HGB 13.5 12.0 11.5*  ?HCT 41.7 37.3 35.5*  ?MCV 88.7 89.4 89.0  ?MCH 28.7 28.8 28.8  ?MCHC 32.4 32.2 32.4  ?RDW 13.7 13.7 13.8  ?PLT 259 213 213  ? ?Thyroid No results for input(s): TSH, FREET4 in the last 168 hours.  ?BNPNo results for input(s): BNP, PROBNP in the last 168 hours.  ?  DDimer No results for input(s): DDIMER in the last 168 hours.  ? ?Radiology  ?  ?CARDIAC CATHETERIZATION ? ?Result Date: 01/01/2022 ?Conclusions: Severe multivessel coronary artery disease, including 60% ostial and 90% distal LMCA stenoses extending into the ostium of the LAD, sequential 90% proximal and 60% mid LAD lesion, and 90% proximal LCx and RCA stenoses. Upper normal left ventricular filling pressure (LVEDP 15 mmHg). Recommendations: Transfer to [facility] for inpatient cardiac surgery consultation, given severity of coronary artery disease and reduced LVEF. Aggressive secondary prevention of coronary artery disease. Optimize goal-directed medical therapy in the setting of chronic HFrEF due to ischemic cardiomyopathy. Gentle post-catheterization hydration, given chronic kidney disease. Nelva Bush, MD [facility]  ? ?Cardiac Studies  ? ?Cath: 01/01/22 ? ?Conclusions: ?Severe multivessel coronary artery disease, including 60% ostial and 90% distal LMCA stenoses extending into the ostium of the LAD, sequential 90% proximal and 60% mid LAD lesion, and 90% proximal LCx and RCA stenoses. ?Upper normal left ventricular filling pressure (LVEDP 15 mmHg). ?  ?Recommendations: ?Transfer to [facility] for inpatient cardiac surgery consultation, given  severity of coronary artery disease and reduced LVEF. ?Aggressive secondary prevention of coronary artery disease. ?Optimize goal-directed medical therapy in the setting of chronic HFrEF due to ischemic cardiomyopathy. ?Gentle post-catheterization hydration, given chronic kidney disease. ?  ?Nelva Bush, MD ?[place] ?  ?Diagnostic ?Dominance: Right ? ? ?Echo: 11/07/21 ? ?IMPRESSIONS  ? ? ? 1. Left ventricular ejection fraction, by estimation, is 30 to 35%. The  ?left ventricle has moderate to severely decreased function. The left  ?ventricle demonstrates global hypokinesis. Left ventricular diastolic  ?parameters are consistent with Grade I  ?diastolic dysfunction (impaired relaxation).  ? 2. Right ventricular systolic function is normal. The right ventricular  ?size is normal.  ? 3. Left atrial size was mildly dilated.  ? 4. The mitral valve is normal in structure. Trivial mitral valve  ?regurgitation.  ? 5. The aortic valve is tricuspid. Aortic valve regurgitation is not  ?visualized.  ? ?FINDINGS  ? Left Ventricle: Left ventricular ejection fraction, by estimation, is 30  ?to 35%. The left ventricle has moderate to severely decreased function.  ?The left ventricle demonstrates global hypokinesis. Global longitudinal  ?strain performed but not reported  ?based on interpreter judgement due to suboptimal tracking. 3D left  ?ventricular ejection fraction analysis performed but not reported based on  ?interpreter judgement due to suboptimal tracking. The left ventricular  ?internal cavity size was normal in size.  ?There is no left ventricular hypertrophy. Left ventricular diastolic  ?parameters are consistent with Grade I diastolic dysfunction (impaired  ?relaxation).  ? ?Right Ventricle: The right ventricular size is normal. No increase in  ?right ventricular wall thickness. Right ventricular systolic function is  ?normal.  ? ?Left Atrium: Left atrial size was mildly dilated.  ? ?Right Atrium: Right atrial  size was normal in size.  ? ?Pericardium: There is no evidence of pericardial effusion.  ? ?Mitral Valve: The mitral valve is normal in structure. Trivial mitral  ?valve regurgitation. MV peak gradient, 7.4 mmHg. The mean mitral valve  ?gradient is 3.0 mmHg.  ? ?Tricuspid Valve: The tricuspid valve is normal in structure. Tricuspid  ?valve regurgitation is not demonstrated.  ? ?Aortic Valve: The aortic valve is tricuspid. Aortic valve regurgitation is  ?not visualized. Aortic valve mean gradient measures 3.0 mmHg. Aortic valve  ?peak gradient measures 5.7 mmHg. Aortic valve area, by VTI measures 2.13  ?cm?.  ? ?Pulmonic Valve: The pulmonic valve  was not well visualized. Pulmonic valve  ?regurgitation is mild.  ? ?Aorta: The aortic root is normal in size and structure.  ? ?Venous: The inferior vena cava was not well visualized.  ? ?IAS/Shunts: No atrial level shunt detected by color flow Doppler.  ? ?Patient Profile  ?   ?86 y.o. female with a h/o HTN, CKD III, LBBB, and recent finding of cardiomyopathy (EF 30-35%), who presented as a tx from [facility] following diagnostic catheterization, which showed severe multivessel CAD.  ? ?Assessment & Plan  ?  ?CAD/Unstable Angina:  presented with persistent DOE over the past few months in the absence of chest pain.  Echo w/ LV dysfxn and EF of 30-35% in early March, followed by stress testing last week, which was notable for a large area of mid-distal anterior and anteroseptal ischemia.   ?-- Cath 4/25 showed severe multivessel CAD including 90% distal LM/ostial LAD, and 90% proximal dzs involving the LCX and RCA.  She was transferred to [facility] for TCTS consultation regarding potential for CABG, eval from surgeon pending.    ?-- Continue coreg 3.'125mg'$  BID, ASA and statin ?  ?Chronic HFrEF/ICM:  EF 30-35% by recent echo w/ GrI DD. LVEDP 15 mmHg.   ?-- Continue BB, holding Entresto with soft BPs and CKD III with recent contrast use for cath   ?-- Consider SGLT2i if renal fxn stable  and pressures improve ?  ?Essential HTN:  Bps soft this morning ?-- continue coreg 3.'125mg'$  BID ?  ?HLD:  LDL 119 (02/2021) ?-- started on high doses statin with her CAD ?-- check lipids in am ?  ?CKD III:  Cr b

## 2022-01-03 ENCOUNTER — Inpatient Hospital Stay (HOSPITAL_COMMUNITY): Payer: Medicare PPO

## 2022-01-03 ENCOUNTER — Encounter (HOSPITAL_COMMUNITY): Payer: Medicare PPO

## 2022-01-03 ENCOUNTER — Other Ambulatory Visit (HOSPITAL_COMMUNITY): Payer: Self-pay

## 2022-01-03 DIAGNOSIS — Z01810 Encounter for preprocedural cardiovascular examination: Secondary | ICD-10-CM

## 2022-01-03 DIAGNOSIS — I2510 Atherosclerotic heart disease of native coronary artery without angina pectoris: Secondary | ICD-10-CM | POA: Diagnosis not present

## 2022-01-03 LAB — BASIC METABOLIC PANEL
Anion gap: 7 (ref 5–15)
BUN: 35 mg/dL — ABNORMAL HIGH (ref 8–23)
CO2: 23 mmol/L (ref 22–32)
Calcium: 8.9 mg/dL (ref 8.9–10.3)
Chloride: 106 mmol/L (ref 98–111)
Creatinine, Ser: 1.89 mg/dL — ABNORMAL HIGH (ref 0.44–1.00)
GFR, Estimated: 26 mL/min — ABNORMAL LOW (ref >60)
Glucose, Bld: 90 mg/dL (ref 70–99)
Potassium: 4.9 mmol/L (ref 3.5–5.1)
Sodium: 136 mmol/L (ref 135–145)

## 2022-01-03 LAB — LIPID PANEL
Cholesterol: 165 mg/dL (ref 0–200)
HDL: 48 mg/dL (ref >40)
LDL Cholesterol: 106 mg/dL — ABNORMAL HIGH (ref 0–99)
Total CHOL/HDL Ratio: 3.4 RATIO
Triglycerides: 56 mg/dL (ref <150)
VLDL: 11 mg/dL (ref 0–40)

## 2022-01-03 LAB — CBC
HCT: 36.8 % (ref 36.0–46.0)
Hemoglobin: 11.6 g/dL — ABNORMAL LOW (ref 12.0–15.0)
MCH: 28.4 pg (ref 26.0–34.0)
MCHC: 31.5 g/dL (ref 30.0–36.0)
MCV: 90.2 fL (ref 80.0–100.0)
Platelets: 223 10*3/uL (ref 150–400)
RBC: 4.08 MIL/uL (ref 3.87–5.11)
RDW: 13.8 % (ref 11.5–15.5)
WBC: 8.4 10*3/uL (ref 4.0–10.5)
nRBC: 0 % (ref 0.0–0.2)

## 2022-01-03 LAB — HEPARIN LEVEL (UNFRACTIONATED): Heparin Unfractionated: 0.26 IU/mL — ABNORMAL LOW (ref 0.30–0.70)

## 2022-01-03 MED ORDER — HEPARIN (PORCINE) 25000 UT/250ML-% IV SOLN
950.0000 [IU]/h | INTRAVENOUS | Status: DC
  Administered 2022-01-03: 800 [IU]/h via INTRAVENOUS
  Administered 2022-01-04 – 2022-01-06 (×3): 950 [IU]/h via INTRAVENOUS
  Filled 2022-01-03 (×5): qty 250

## 2022-01-03 NOTE — Progress Notes (Signed)
ANTICOAGULATION CONSULT NOTE - Follow Up Consult ? ?Pharmacy Consult for Heparin ?Indication: chest pain/ACS ? ?Allergies  ?Allergen Reactions  ? Lisinopril Other (See Comments)  ?  "passed out" ?Other reaction(s): Other (See Comments), Other (See Comments) ?"passed out" ?"I passed out"  ? Carbamazepine Rash  ? ? ?Patient Measurements: ?Height: 5' (152.4 cm) ?Weight: 78.8 kg (173 lb 11.2 oz) ?IBW/kg (Calculated) : 45.5 ?Heparin Dosing Weight: 63 kg ? ?Vital Signs: ?Temp: 98.3 ?F (36.8 ?C) (04/27 2032) ?Temp Source: Oral (04/27 2032) ?BP: 117/53 (04/27 2032) ?Pulse Rate: 68 (04/27 2032) ? ?Labs: ?Recent Labs  ?  01/01/22 ?3491 01/02/22 ?0346 01/03/22 ?0300 01/03/22 ?1945  ?HGB 12.0 11.5* 11.6*  --   ?HCT 37.3 35.5* 36.8  --   ?PLT 213 213 223  --   ?HEPARINUNFRC  --   --   --  0.26*  ?CREATININE 1.79* 1.71* 1.89*  --   ? ? ?Estimated Creatinine Clearance: 20.6 mL/min (A) (by C-G formula based on SCr of 1.89 mg/dL (H)). ? ?Assessment: ?86 yo female s/p cath 4/26 with multivessel CAD to started IV heparin 4/27 am. Plans are noted for CABG on Monday. No anticoagulants noted PTA.  ?Hgb 11.6, platelets 223. ? ?Initial heparin level is 0.26 on 900 units/hr. Just below therapeutic range. ?  ?Goal of Therapy:  ?Heparin level 0.3-0.7 units/ml ?Monitor platelets by anticoagulation protocol: Yes ?  ?Plan:  ?Increase heparin drip to 950 units/hr ?Next heparin level ~8 hrs after rate change, in am. ?Daily heparin level and CBC. ? ?Arty Baumgartner, RPh ?01/03/2022,8:58 PM ? ? ?

## 2022-01-03 NOTE — Progress Notes (Incomplete)
{  Select Note:[id number]} 

## 2022-01-03 NOTE — Progress Notes (Signed)
Pre-CABG study completed.  ? ?Please see CV Proc for preliminary results.  ? ?Darlin Coco, RDMS, RVT and [facility], RDMS, RVT ? ?

## 2022-01-03 NOTE — Progress Notes (Signed)
Discussed with pt and daughter IS, sternal precautions, mobility post op and d/c planning. Very receptive, already reading the book.  ?[id number] ?Yves Dill CES, ACSM ?2:07 PM ?01/03/2022 ? ?

## 2022-01-03 NOTE — Progress Notes (Addendum)
? ?Progress Note ? ?Patient Name: Katherine Brooks ?Date of Encounter: 01/03/2022 ? ?[facility] Cardiologist: Ida Rogue, MD  ? ?Subjective  ? ?No recurrent symptoms. Sitting up comfortably in bed.  ? ?Inpatient Medications  ?  ?Scheduled Meds: ? aspirin EC  81 mg Oral Daily  ? atorvastatin  80 mg Oral Daily  ? carvedilol  3.125 mg Oral BID WC  ? enoxaparin (LOVENOX) injection  30 mg Subcutaneous Q24H  ? furosemide  20 mg Oral Daily  ? sacubitril-valsartan  1 tablet Oral BID  ? ?Continuous Infusions: ? ?PRN Meds: ?acetaminophen, nitroGLYCERIN, ondansetron (ZOFRAN) IV  ? ?Vital Signs  ?  ?Vitals:  ? 01/02/22 0809 01/02/22 1707 01/02/22 2110 01/03/22 0445  ?BP: (!) 112/46 (!) 140/55 130/70 125/73  ?Pulse: 61 71 70 66  ?Resp:   17 18  ?Temp:   98.6 ?F (37 ?C) 97.9 ?F (36.6 ?C)  ?TempSrc:   Oral Oral  ?SpO2:   99% 97%  ?Weight:    78.8 kg  ?Height:      ? ?No intake or output data in the 24 hours ending 01/03/22 0756 ? ?  01/03/2022  ?  4:45 AM 01/02/2022  ?  3:57 AM 01/01/2022  ?  6:00 PM  ?Last 3 Weights  ?Weight (lbs) 173 lb 11.2 oz 173 lb 6.4 oz 174 lb  ?Weight (kg) 78.79 kg 78.654 kg 78.926 kg  ?   ? ?Telemetry  ?  ?Sinus Loletha Grayer - Personally Reviewed ? ?ECG  ?  ?No new tracing ? ?Physical Exam  ? ?GEN: No acute distress.   ?Neck: No JVD ?Cardiac: RRR, no murmurs, rubs, or gallops.  ?Respiratory: Clear to auscultation bilaterally. ?GI: Soft, nontender, non-distended  ?MS: No edema; No deformity. Right radial cath site.  ?Neuro:  Nonfocal  ?Psych: Normal affect  ? ?Labs  ?  ?High Sensitivity Troponin:  No results for input(s): TROPONINIHS in the last 720 hours.   ?Chemistry ?Recent Labs  ?Lab 12/31/21 ?1004 12/31/21 ?1051 01/01/22 ?3875 01/02/22 ?0346  ?NA 136 140  --  138  ?K 4.8 5.0  --  4.4  ?CL 106 107  --  110  ?CO2 22 23  --  22  ?GLUCOSE 113* 102*  --  81  ?BUN 43* 45*  --  31*  ?CREATININE 1.72* 1.77* 1.79* 1.71*  ?CALCIUM 10.1 9.7  --  9.0  ?PROT 8.0  --   --   --   ?ALBUMIN 4.6  --   --   --   ?AST 20  --    --   --   ?ALT 13  --   --   --   ?ALKPHOS 58  --   --   --   ?BILITOT 0.7  --   --   --   ?GFRNONAA 29* 28* 28* 29*  ?ANIONGAP 8 10  --  6  ?  ?Lipids  ?Recent Labs  ?Lab 01/03/22 ?0300  ?CHOL 165  ?TRIG 56  ?HDL 48  ?LDLCALC 106*  ?CHOLHDL 3.4  ?  ?Hematology ?Recent Labs  ?Lab 12/31/21 ?1051 01/01/22 ?6433 01/02/22 ?0346  ?WBC 7.7 6.4 7.3  ?RBC 4.70 4.17 3.99  ?HGB 13.5 12.0 11.5*  ?HCT 41.7 37.3 35.5*  ?MCV 88.7 89.4 89.0  ?MCH 28.7 28.8 28.8  ?MCHC 32.4 32.2 32.4  ?RDW 13.7 13.7 13.8  ?PLT 259 213 213  ? ?Thyroid No results for input(s): TSH, FREET4 in the last 168 hours.  ?BNPNo results for input(s):  BNP, PROBNP in the last 168 hours.  ?DDimer No results for input(s): DDIMER in the last 168 hours.  ? ?Radiology  ?  ?CT CHEST WO CONTRAST ? ?Result Date: 01/02/2022 ?CLINICAL DATA:  Preop thoracic aortic disease. EXAM: CT CHEST WITHOUT CONTRAST TECHNIQUE: Multidetector CT imaging of the chest was performed following the standard protocol without IV contrast. RADIATION DOSE REDUCTION: This exam was performed according to the departmental dose-optimization program which includes automated exposure control, adjustment of the mA and/or kV according to patient size and/or use of iterative reconstruction technique. COMPARISON:  None. FINDINGS: Cardiovascular: Atherosclerotic calcification of the aorta, aortic valve and coronary arteries. Ascending aorta is normal in caliber, measuring up to 3.1 cm. Heart size normal. No pericardial effusion. Mediastinum/Nodes: No pathologically enlarged mediastinal or axillary lymph nodes. Hilar regions are difficult to definitively evaluate without IV contrast. Esophagus is grossly unremarkable. Moderate hiatal hernia. Lungs/Pleura: Smudgy triangular-shaped nodule along the minor fissure is likely a benign subpleural lymph node. A few scattered millimetric pulmonary nodules measure up to 3 mm in the left lower lobe (8/102). No pleural fluid. Airway is unremarkable. Upper Abdomen:  Liver is unremarkable. Stones layer in the gallbladder. Visualized portions of the adrenal glands, kidneys, spleen, pancreas, stomach and bowel are unremarkable with the exception of a moderate hiatal hernia. Musculoskeletal: Degenerative changes in the spine. No worrisome lytic or sclerotic lesions. IMPRESSION: 1. No evidence of aortic aneurysm. 2. Millimetric pulmonary nodules. No follow-up needed if patient is low-risk (and has no known or suspected primary neoplasm). Non-contrast chest CT can be considered in 12 months if patient is high-risk. This recommendation follows the consensus statement: Guidelines for Management of Incidental Pulmonary Nodules Detected on CT Images: From the Fleischner Society 2017; Radiology 2017; 284:228-243. 3. Moderate hiatal hernia. 4. Cholelithiasis. 5. Aortic atherosclerosis (ICD10-I70.0). Coronary artery calcification. Electronically Signed   By: Lorin Picket M.D.   On: 01/02/2022 15:10  ? ?CARDIAC CATHETERIZATION ? ?Result Date: 01/01/2022 ?Conclusions: Severe multivessel coronary artery disease, including 60% ostial and 90% distal LMCA stenoses extending into the ostium of the LAD, sequential 90% proximal and 60% mid LAD lesion, and 90% proximal LCx and RCA stenoses. Upper normal left ventricular filling pressure (LVEDP 15 mmHg). Recommendations: Transfer to [facility] for inpatient cardiac surgery consultation, given severity of coronary artery disease and reduced LVEF. Aggressive secondary prevention of coronary artery disease. Optimize goal-directed medical therapy in the setting of chronic HFrEF due to ischemic cardiomyopathy. Gentle post-catheterization hydration, given chronic kidney disease. Nelva Bush, MD [facility]  ? ?Cardiac Studies  ? ?Cath: 01/01/22 ?  ?Conclusions: ?Severe multivessel coronary artery disease, including 60% ostial and 90% distal LMCA stenoses extending into the ostium of the LAD, sequential 90% proximal and 60% mid LAD lesion, and 90%  proximal LCx and RCA stenoses. ?Upper normal left ventricular filling pressure (LVEDP 15 mmHg). ?  ?Recommendations: ?Transfer to [facility] for inpatient cardiac surgery consultation, given severity of coronary artery disease and reduced LVEF. ?Aggressive secondary prevention of coronary artery disease. ?Optimize goal-directed medical therapy in the setting of chronic HFrEF due to ischemic cardiomyopathy. ?Gentle post-catheterization hydration, given chronic kidney disease. ?  ?Nelva Bush, MD ?[place] ?  ?Diagnostic ?Dominance: Right ?  ?Echo: 11/07/21 ?  ?IMPRESSIONS  ? ? ? 1. Left ventricular ejection fraction, by estimation, is 30 to 35%. The  ?left ventricle has moderate to severely decreased function. The left  ?ventricle demonstrates global hypokinesis. Left ventricular diastolic  ?parameters are consistent with Grade I  ?diastolic  dysfunction (impaired relaxation).  ? 2. Right ventricular systolic function is normal. The right ventricular  ?size is normal.  ? 3. Left atrial size was mildly dilated.  ? 4. The mitral valve is normal in structure. Trivial mitral valve  ?regurgitation.  ? 5. The aortic valve is tricuspid. Aortic valve regurgitation is not  ?visualized.  ? ?FINDINGS  ? Left Ventricle: Left ventricular ejection fraction, by estimation, is 30  ?to 35%. The left ventricle has moderate to severely decreased function.  ?The left ventricle demonstrates global hypokinesis. Global longitudinal  ?strain performed but not reported  ?based on interpreter judgement due to suboptimal tracking. 3D left  ?ventricular ejection fraction analysis performed but not reported based on  ?interpreter judgement due to suboptimal tracking. The left ventricular  ?internal cavity size was normal in size.  ?There is no left ventricular hypertrophy. Left ventricular diastolic  ?parameters are consistent with Grade I diastolic dysfunction (impaired  ?relaxation).  ? ?Right Ventricle: The right ventricular size is  normal. No increase in  ?right ventricular wall thickness. Right ventricular systolic function is  ?normal.  ? ?Left Atrium: Left atrial size was mildly dilated.  ? ?Right Atrium: Right atrial size was norma

## 2022-01-03 NOTE — TOC Benefit Eligibility Note (Signed)
Patient Advocate Encounter ? ?Insurance verification completed.   ? ?The patient is currently admitted and upon discharge could be taking Farxiga 10 mg. ? ?The current 30 day co-pay is, $64.00.  ? ?The patient is currently admitted and upon discharge could be taking Jardiance 10 mg. ? ?The current 30 day co-pay is, $40.00.  ? ?The patient is insured through [employer] Part D  ? ? ? ?Lyndel Safe, CPhT ?Pharmacy Patient Advocate Specialist ?[place] Patient Advocate Team ?Direct Number: [phone]  Fax: [phone] ? ? ? ? ? ?  ?

## 2022-01-03 NOTE — Progress Notes (Signed)
Brought pt OHS booklet, careguide and IS. She can perform 1500-1600 ml on IS. Will do formal preop ed this afternoon when her daughter arrives. No sx, she has been up in room. Sts she was discouraged from ambulating in hall.  ?[id number] ?Yves Dill CES, ACSM ?11:10 AM ?01/03/2022 ? ?

## 2022-01-03 NOTE — Progress Notes (Signed)
ANTICOAGULATION CONSULT NOTE - Initial Consult ? ?Pharmacy Consult for heparin  ?Indication: chest pain/ACS ? ?Allergies  ?Allergen Reactions  ? Lisinopril Other (See Comments)  ?  "passed out" ?Other reaction(s): Other (See Comments), Other (See Comments) ?"passed out" ?"I passed out"  ? Carbamazepine Rash  ? ? ?Patient Measurements: ?Height: 5' (152.4 cm) ?Weight: 78.8 kg (173 lb 11.2 oz) ?IBW/kg (Calculated) : 45.5 ?Heparin Dosing Weight: 63kg ? ? ?Vital Signs: ?Temp: 97.9 ?F (36.6 ?C) (04/27 0445) ?Temp Source: Oral (04/27 0445) ?BP: 100/50 (04/27 0818) ?Pulse Rate: 66 (04/27 0818) ? ?Labs: ?Recent Labs  ?  01/01/22 ?9826 01/02/22 ?0346 01/03/22 ?0300  ?HGB 12.0 11.5* 11.6*  ?HCT 37.3 35.5* 36.8  ?PLT 213 213 223  ?CREATININE 1.79* 1.71* 1.89*  ? ? ?Estimated Creatinine Clearance: 20.6 mL/min (A) (by C-G formula based on SCr of 1.89 mg/dL (H)). ? ? ?Medical History: ?Past Medical History:  ?Diagnosis Date  ? Allergy   ? CAD (coronary artery disease)   ? a. 12/2021 MV: mid-dist ant, antsept ischemia; b. 12/2021 Cath: LM 60ost, 90d, LAD 90ost, 95p, 57m D2 30, LCX 90p, RCA 90p.  ? Cancer ([facility]   ? skin  ? CKD (chronic kidney disease), stage III ([place]   ? COVID-19 virus infection 09/2021  ? Facial pain   ? Hypertension   ? Ischemic cardiomyopathy   ? a. 11/2021 Echo: EF 30-35%, glob HK, GrI DD, nl RV fxn, mildly dil LA, triv MR.  ? LBBB (left bundle branch block)   ? ? ?Medications:  ?Facility-Administered Medications Prior to Admission  ?Medication Dose Route Frequency Provider Last Rate Last Admin  ? sodium chloride flush (NS) 0.9 % injection 3 mL  3 mL Intravenous Q12H Gollan, TKathlene November MD      ? ?Medications Prior to Admission  ?Medication Sig Dispense Refill Last Dose  ? Ascorbic Acid (VITAMIN C) 1000 MG tablet Take 1,000 mg by mouth.   Past Week  ? Biotin 10000 MCG TABS Take 10,000 mcg by mouth daily.   Past Week  ? CALCIUM PO Take 1,000 mg by mouth daily.   Past Week  ? carboxymethylcellul-glycerin (REFRESH  RELIEVA) 0.5-0.9 % ophthalmic solution Place 1 drop into both eyes 2 (two) times a week.   Past Week  ? carvedilol (COREG) 3.125 MG tablet Take 1 tablet (3.125 mg total) by mouth 2 (two) times daily with a meal. 180 tablet 0 12/31/2021 at 0600  ? Cholecalciferol (VITAMIN D3) 125 MCG (5000 UT) TABS Take 5,000 Units by mouth daily.   Past Week  ? furosemide (LASIX) 20 MG tablet Take 1 tablet (20 mg total) by mouth daily. 30 tablet 0 Past Week  ? MAGNESIUM PO Take 400 mg by mouth daily.   Past Week  ? Multiple Vitamins-Minerals (WOMENS 50+ MULTI VITAMIN/MIN PO) Take 1 capsule by mouth daily. ALIVE   Past Month  ? Polyethylene Glycol 400 (BLINK TEARS OP) Place 1 drop into both eyes daily.   Past Week  ? sacubitril-valsartan (ENTRESTO) 24-26 MG Take 1 tablet by mouth 2 (two) times daily. 60 tablet 0 Past Week  ? aspirin EC 81 MG tablet Take 1 tablet (81 mg total) by mouth daily. Swallow whole. (Patient not taking: Reported on 01/02/2022)   Not Taking  ? ?Scheduled:  ? aspirin EC  81 mg Oral Daily  ? atorvastatin  80 mg Oral Daily  ? carvedilol  3.125 mg Oral BID WC  ? ? ?Assessment: ?86yo female s/p cath 4/26 with  multivessel CAD to start heparin. Plans are noted for CABG on Monday. No anticoagulants noted PTA.  ?-hg= 11.6 ?  ?Goal of Therapy:  ?Heparin level 0.3-0.7 units/ml ?Monitor platelets by anticoagulation protocol: Yes ?  ?Plan:  ?-start heparin at 800 units/hr ?-Heparin level in 8 hours and daily wth CBC daily ? ?Hildred Laser, PharmD ?Clinical Pharmacist ?**Pharmacist phone directory can now be found on [email or web address] ([email or web address]).  Listed under [place]. ? ? ? ?

## 2022-01-04 DIAGNOSIS — I25118 Atherosclerotic heart disease of native coronary artery with other forms of angina pectoris: Secondary | ICD-10-CM

## 2022-01-04 DIAGNOSIS — I2510 Atherosclerotic heart disease of native coronary artery without angina pectoris: Secondary | ICD-10-CM | POA: Diagnosis not present

## 2022-01-04 LAB — BASIC METABOLIC PANEL
Anion gap: 6 (ref 5–15)
BUN: 27 mg/dL — ABNORMAL HIGH (ref 8–23)
CO2: 23 mmol/L (ref 22–32)
Calcium: 8.9 mg/dL (ref 8.9–10.3)
Chloride: 108 mmol/L (ref 98–111)
Creatinine, Ser: 1.67 mg/dL — ABNORMAL HIGH (ref 0.44–1.00)
GFR, Estimated: 30 mL/min — ABNORMAL LOW (ref >60)
Glucose, Bld: 102 mg/dL — ABNORMAL HIGH (ref 70–99)
Potassium: 4.7 mmol/L (ref 3.5–5.1)
Sodium: 137 mmol/L (ref 135–145)

## 2022-01-04 LAB — CBC
HCT: 36.3 % (ref 36.0–46.0)
Hemoglobin: 12 g/dL (ref 12.0–15.0)
MCH: 29.4 pg (ref 26.0–34.0)
MCHC: 33.1 g/dL (ref 30.0–36.0)
MCV: 89 fL (ref 80.0–100.0)
Platelets: 202 10*3/uL (ref 150–400)
RBC: 4.08 MIL/uL (ref 3.87–5.11)
RDW: 13.6 % (ref 11.5–15.5)
WBC: 7.6 10*3/uL (ref 4.0–10.5)
nRBC: 0 % (ref 0.0–0.2)

## 2022-01-04 LAB — HEPARIN LEVEL (UNFRACTIONATED)
Heparin Unfractionated: 0.41 IU/mL (ref 0.30–0.70)
Heparin Unfractionated: 0.46 IU/mL (ref 0.30–0.70)

## 2022-01-04 MED ORDER — NITROGLYCERIN IN D5W 200-5 MCG/ML-% IV SOLN
2.0000 ug/min | INTRAVENOUS | Status: AC
  Administered 2022-01-07: 10 ug/min via INTRAVENOUS
  Filled 2022-01-04: qty 250

## 2022-01-04 MED ORDER — EPINEPHRINE HCL 5 MG/250ML IV SOLN IN NS
0.0000 ug/min | INTRAVENOUS | Status: DC
  Filled 2022-01-04: qty 250

## 2022-01-04 MED ORDER — HEPARIN 30,000 UNITS/1000 ML (OHS) CELLSAVER SOLUTION
Status: DC
  Filled 2022-01-04: qty 1000

## 2022-01-04 MED ORDER — VANCOMYCIN HCL 1250 MG/250ML IV SOLN
1250.0000 mg | INTRAVENOUS | Status: AC
  Administered 2022-01-07: 1250 mg via INTRAVENOUS
  Filled 2022-01-04: qty 250

## 2022-01-04 MED ORDER — TRANEXAMIC ACID (OHS) PUMP PRIME SOLUTION
2.0000 mg/kg | INTRAVENOUS | Status: DC
  Filled 2022-01-04: qty 1.57

## 2022-01-04 MED ORDER — MILRINONE LACTATE IN DEXTROSE 20-5 MG/100ML-% IV SOLN
0.3000 ug/kg/min | INTRAVENOUS | Status: DC
  Filled 2022-01-04: qty 100

## 2022-01-04 MED ORDER — INSULIN REGULAR(HUMAN) IN NACL 100-0.9 UT/100ML-% IV SOLN
INTRAVENOUS | Status: DC
  Filled 2022-01-04: qty 100

## 2022-01-04 MED ORDER — TRANEXAMIC ACID 1000 MG/10ML IV SOLN
1.5000 mg/kg/h | INTRAVENOUS | Status: AC
  Administered 2022-01-07: 1.5 mg/kg/h via INTRAVENOUS
  Filled 2022-01-04: qty 25

## 2022-01-04 MED ORDER — POTASSIUM CHLORIDE 2 MEQ/ML IV SOLN
80.0000 meq | INTRAVENOUS | Status: DC
  Filled 2022-01-04: qty 40

## 2022-01-04 MED ORDER — TRANEXAMIC ACID (OHS) BOLUS VIA INFUSION
15.0000 mg/kg | INTRAVENOUS | Status: DC
  Filled 2022-01-04: qty 1179

## 2022-01-04 MED ORDER — MAGNESIUM SULFATE 50 % IJ SOLN
40.0000 meq | INTRAMUSCULAR | Status: DC
  Filled 2022-01-04: qty 9.85

## 2022-01-04 MED ORDER — TRANEXAMIC ACID 1000 MG/10ML IV SOLN
1.5000 mg/kg/h | INTRAVENOUS | Status: DC
  Filled 2022-01-04: qty 25

## 2022-01-04 MED ORDER — PLASMA-LYTE A IV SOLN
INTRAVENOUS | Status: DC
  Filled 2022-01-04: qty 2.5

## 2022-01-04 MED ORDER — NOREPINEPHRINE 4 MG/250ML-% IV SOLN
0.0000 ug/min | INTRAVENOUS | Status: DC
  Filled 2022-01-04: qty 250

## 2022-01-04 MED ORDER — PHENYLEPHRINE HCL-NACL 20-0.9 MG/250ML-% IV SOLN
30.0000 ug/min | INTRAVENOUS | Status: DC
  Filled 2022-01-04: qty 250

## 2022-01-04 MED ORDER — NITROGLYCERIN IN D5W 200-5 MCG/ML-% IV SOLN
2.0000 ug/min | INTRAVENOUS | Status: DC
  Filled 2022-01-04: qty 250

## 2022-01-04 MED ORDER — CEFAZOLIN SODIUM-DEXTROSE 2-4 GM/100ML-% IV SOLN
2.0000 g | INTRAVENOUS | Status: DC
  Filled 2022-01-04: qty 100

## 2022-01-04 MED ORDER — DEXMEDETOMIDINE HCL IN NACL 400 MCG/100ML IV SOLN
0.1000 ug/kg/h | INTRAVENOUS | Status: AC
  Administered 2022-01-07: .4 ug/kg/h via INTRAVENOUS
  Filled 2022-01-04: qty 100

## 2022-01-04 MED ORDER — DEXMEDETOMIDINE HCL IN NACL 400 MCG/100ML IV SOLN
0.1000 ug/kg/h | INTRAVENOUS | Status: DC
  Filled 2022-01-04: qty 100

## 2022-01-04 MED ORDER — TRANEXAMIC ACID (OHS) BOLUS VIA INFUSION
15.0000 mg/kg | INTRAVENOUS | Status: AC
  Administered 2022-01-07: 1179 mg via INTRAVENOUS
  Filled 2022-01-04: qty 1179

## 2022-01-04 MED ORDER — CEFAZOLIN SODIUM-DEXTROSE 2-4 GM/100ML-% IV SOLN
2.0000 g | INTRAVENOUS | Status: AC
  Administered 2022-01-07 (×2): 2 g via INTRAVENOUS
  Filled 2022-01-04: qty 100

## 2022-01-04 MED ORDER — PHENYLEPHRINE HCL-NACL 20-0.9 MG/250ML-% IV SOLN
30.0000 ug/min | INTRAVENOUS | Status: AC
  Administered 2022-01-07: 20 ug/min via INTRAVENOUS
  Filled 2022-01-04: qty 250

## 2022-01-04 MED ORDER — VANCOMYCIN HCL 1250 MG/250ML IV SOLN
1250.0000 mg | INTRAVENOUS | Status: DC
  Filled 2022-01-04: qty 250

## 2022-01-04 MED ORDER — INSULIN REGULAR(HUMAN) IN NACL 100-0.9 UT/100ML-% IV SOLN
INTRAVENOUS | Status: AC
  Administered 2022-01-07: 1.1 [IU]/h via INTRAVENOUS
  Filled 2022-01-04: qty 100

## 2022-01-04 MED ORDER — PAPAVERINE HCL 30 MG/ML IJ SOLN
INTRAMUSCULAR | Status: DC
  Filled 2022-01-04: qty 2.5

## 2022-01-04 MED ORDER — POTASSIUM CHLORIDE 2 MEQ/ML IV SOLN
80.0000 meq | INTRAVENOUS | Status: DC
Start: 2022-01-05 — End: 2022-01-04
  Filled 2022-01-04: qty 40

## 2022-01-04 NOTE — Progress Notes (Signed)
Procedure(s) (LRB): ?CORONARY ARTERY BYPASS GRAFTING (CABG) (N/A) ?TRANSESOPHAGEAL ECHOCARDIOGRAM (TEE) (N/A) ?Subjective: ?No chest pain or shortness of breath. ? ?Objective: ?Vital signs in last 24 hours: ?Temp:  [97.8 ?F (36.6 ?C)-98.3 ?F (36.8 ?C)] 97.8 ?F (36.6 ?C) (04/28 0754) ?Pulse Rate:  [62-71] 69 (04/28 0754) ?Cardiac Rhythm: Sinus bradycardia (04/28 0750) ?Resp:  [12-16] 12 (04/28 0754) ?BP: (112-129)/(47-77) 129/77 (04/28 0754) ?SpO2:  [97 %-98 %] 98 % (04/28 0350) ?Weight:  [78.6 kg] 78.6 kg (04/28 0350) ? ?Hemodynamic parameters for last 24 hours: ?  ? ?Intake/Output from previous day: ?04/27 0701 - 04/28 0700 ?In: 720 [P.O.:720] ?Out: -  ?Intake/Output this shift: ?No intake/output data recorded. ? ?Heart: regular rate and rhythm ?Lungs: clear to auscultation bilaterally ? ?Lab Results: ?Recent Labs  ?  01/03/22 ?0300 01/04/22 ?0440  ?WBC 8.4 7.6  ?HGB 11.6* 12.0  ?HCT 36.8 36.3  ?PLT 223 202  ? ?BMET:  ?Recent Labs  ?  01/03/22 ?0300 01/04/22 ?0440  ?NA 136 137  ?K 4.9 4.7  ?CL 106 108  ?CO2 23 23  ?GLUCOSE 90 102*  ?BUN 35* 27*  ?CREATININE 1.89* 1.67*  ?CALCIUM 8.9 8.9  ?  ?PT/INR: No results for input(s): LABPROT, INR in the last 72 hours. ?ABG ?No results found for: PHART, HCO3, TCO2, ACIDBASEDEF, O2SAT ?CBG (last 3)  ?No results for input(s): GLUCAP in the last 72 hours. ? ?Assessment/Plan: ? ?Severe LM and multi-vessel CAD ?Stage 3 CKD. Creat improved to 1.67 today which is about baseline for her.  ?Plan CABG Monday. She and daughter have no further questions. ? ? LOS: 3 days  ? ? ?Gaye Pollack ?01/04/2022 ? ? ?

## 2022-01-04 NOTE — Progress Notes (Signed)
ANTICOAGULATION CONSULT NOTE - Follow Up Consult ? ?Pharmacy Consult for heparin ?Indication:  CAD awaiting CABG ? ?Labs: ?Recent Labs  ?  01/02/22 ?0346 01/03/22 ?0300 01/03/22 ?1945 01/04/22 ?0440  ?HGB 11.5* 11.6*  --  12.0  ?HCT 35.5* 36.8  --  36.3  ?PLT 213 223  --  202  ?HEPARINUNFRC  --   --  0.26* 0.41  ?CREATININE 1.71* 1.89*  --  1.67*  ? ? ?Assessment/Plan:  ?86yo female therapeutic on heparin after rate change. Will continue infusion at current rate of 950 units/hr and confirm stable with additional level.  ? ?Wynona Neat, PharmD, BCPS  ?01/04/2022,5:48 AM ? ? ?

## 2022-01-04 NOTE — Care Management Important Message (Signed)
Important Message ? ?Patient Details  ?Name: Katherine Brooks ?MRN: [id number] ?Date of Birth: 1936/08/14 ? ? ?Medicare Important Message Given:  Yes ? ? ? ? ?Shelda Altes ?01/04/2022, 7:45 AM ?

## 2022-01-04 NOTE — Progress Notes (Signed)
ANTICOAGULATION CONSULT NOTE - Follow Up Consult ? ?Pharmacy Consult for Heparin ?Indication: chest pain/ACS ? ?Allergies  ?Allergen Reactions  ? Lisinopril Other (See Comments)  ?  "passed out" ?Other reaction(s): Other (See Comments), Other (See Comments) ?"passed out" ?"I passed out"  ? Carbamazepine Rash  ? ? ?Patient Measurements: ?Height: 5' (152.4 cm) ?Weight: 78.6 kg (173 lb 3.2 oz) ?IBW/kg (Calculated) : 45.5 ?Heparin Dosing Weight: 63 kg ? ?Vital Signs: ?Temp: 97.8 ?F (36.6 ?C) (04/28 0754) ?Temp Source: Oral (04/28 0754) ?BP: 129/77 (04/28 0754) ?Pulse Rate: 69 (04/28 0754) ? ?Labs: ?Recent Labs  ?  01/02/22 ?0346 01/03/22 ?0300 01/03/22 ?1945 01/04/22 ?5885 01/04/22 ?1335  ?HGB 11.5* 11.6*  --  12.0  --   ?HCT 35.5* 36.8  --  36.3  --   ?PLT 213 223  --  202  --   ?HEPARINUNFRC  --   --  0.26* 0.41 0.46  ?CREATININE 1.71* 1.89*  --  1.67*  --   ? ? ? ?Estimated Creatinine Clearance: 23.2 mL/min (A) (by C-G formula based on SCr of 1.67 mg/dL (H)). ? ?Assessment: ?86 yo female s/p cath 4/26 with multivessel CAD to started IV heparin 4/27 am. Plans are noted for CABG on Monday. No anticoagulants noted PTA.  ?-heparin level at goal on 950 units/hr ? ? ? ?Goal of Therapy:  ?Heparin level 0.3-0.7 units/ml ?Monitor platelets by anticoagulation protocol: Yes ?  ?Plan:  ?Continue heparin at 950 units/hr ?Daily -heparin level and CBC. ? ?Hildred Laser, PharmD ?Clinical Pharmacist ?**Pharmacist phone directory can now be found on [email or web address] ([email or web address]).  Listed under [place]. ? ? ? ? ?

## 2022-01-04 NOTE — Progress Notes (Addendum)
? ?Progress Note ? ?Patient Name: Katherine Brooks ?Date of Encounter: 01/04/2022 ? ?[facility] Cardiologist: Ida Rogue, MD  ? ?Subjective  ? ?No complaints this morning.  ? ?Inpatient Medications  ?  ?Scheduled Meds: ? aspirin EC  81 mg Oral Daily  ? atorvastatin  80 mg Oral Daily  ? carvedilol  3.125 mg Oral BID WC  ? ?Continuous Infusions: ? heparin 950 Units/hr (01/04/22 0518)  ? ?PRN Meds: ?acetaminophen, nitroGLYCERIN, ondansetron (ZOFRAN) IV  ? ?Vital Signs  ?  ?Vitals:  ? 01/03/22 1738 01/03/22 2032 01/04/22 0350 01/04/22 0754  ?BP: (!) 113/47 (!) 117/53 (!) 123/56 129/77  ?Pulse: 71 68 62 69  ?Resp:  '15 14 12  '$ ?Temp:  98.3 ?F (36.8 ?C) 97.9 ?F (36.6 ?C) 97.8 ?F (36.6 ?C)  ?TempSrc:  Oral Oral Oral  ?SpO2:  97% 98%   ?Weight:   78.6 kg   ?Height:      ? ? ?Intake/Output Summary (Last 24 hours) at 01/04/2022 0916 ?Last data filed at 01/04/2022 0530 ?Gross per 24 hour  ?Intake 720 ml  ?Output --  ?Net 720 ml  ? ? ?  01/04/2022  ?  3:50 AM 01/03/2022  ?  4:45 AM 01/02/2022  ?  3:57 AM  ?Last 3 Weights  ?Weight (lbs) 173 lb 3.2 oz 173 lb 11.2 oz 173 lb 6.4 oz  ?Weight (kg) 78.563 kg 78.79 kg 78.654 kg  ?   ? ?Telemetry  ?  ?Sinus rhythm with HR 60s - Personally Reviewed ? ?ECG  ?  ?No new tracings - Personally Reviewed ? ?Physical Exam  ? ?GEN: No acute distress.   ?Neck: No JVD ?Cardiac: RRR, no murmurs, rubs, or gallops.  ?Respiratory: Clear to auscultation bilaterally. ?GI: Soft, nontender, non-distended  ?MS: No edema; No deformity. ?Neuro:  Nonfocal  ?Psych: Normal affect  ? ?Labs  ?  ?High Sensitivity Troponin:  No results for input(s): TROPONINIHS in the last 720 hours.   ?Chemistry ?Recent Labs  ?Lab 12/31/21 ?1004 12/31/21 ?1051 01/02/22 ?0346 01/03/22 ?0300 01/04/22 ?0440  ?NA 136   < > 138 136 137  ?K 4.8   < > 4.4 4.9 4.7  ?CL 106   < > 110 106 108  ?CO2 22   < > '22 23 23  '$ ?GLUCOSE 113*   < > 81 90 102*  ?BUN 43*   < > 31* 35* 27*  ?CREATININE 1.72*   < > 1.71* 1.89* 1.67*  ?CALCIUM 10.1   < > 9.0  8.9 8.9  ?PROT 8.0  --   --   --   --   ?ALBUMIN 4.6  --   --   --   --   ?AST 20  --   --   --   --   ?ALT 13  --   --   --   --   ?ALKPHOS 58  --   --   --   --   ?BILITOT 0.7  --   --   --   --   ?GFRNONAA 29*   < > 29* 26* 30*  ?ANIONGAP 8   < > '6 7 6  '$ ? < > = values in this interval not displayed.  ?  ?Lipids  ?Recent Labs  ?Lab 01/03/22 ?0300  ?CHOL 165  ?TRIG 56  ?HDL 48  ?LDLCALC 106*  ?CHOLHDL 3.4  ?  ?Hematology ?Recent Labs  ?Lab 01/02/22 ?0346 01/03/22 ?0300 01/04/22 ?0440  ?WBC 7.3 8.4 7.6  ?  RBC 3.99 4.08 4.08  ?HGB 11.5* 11.6* 12.0  ?HCT 35.5* 36.8 36.3  ?MCV 89.0 90.2 89.0  ?MCH 28.8 28.4 29.4  ?MCHC 32.4 31.5 33.1  ?RDW 13.8 13.8 13.6  ?PLT 213 223 202  ? ?Thyroid No results for input(s): TSH, FREET4 in the last 168 hours.  ?BNPNo results for input(s): BNP, PROBNP in the last 168 hours.  ?DDimer No results for input(s): DDIMER in the last 168 hours.  ? ?Radiology  ?  ?CT CHEST WO CONTRAST ? ?Result Date: 01/02/2022 ?CLINICAL DATA:  Preop thoracic aortic disease. EXAM: CT CHEST WITHOUT CONTRAST TECHNIQUE: Multidetector CT imaging of the chest was performed following the standard protocol without IV contrast. RADIATION DOSE REDUCTION: This exam was performed according to the departmental dose-optimization program which includes automated exposure control, adjustment of the mA and/or kV according to patient size and/or use of iterative reconstruction technique. COMPARISON:  None. FINDINGS: Cardiovascular: Atherosclerotic calcification of the aorta, aortic valve and coronary arteries. Ascending aorta is normal in caliber, measuring up to 3.1 cm. Heart size normal. No pericardial effusion. Mediastinum/Nodes: No pathologically enlarged mediastinal or axillary lymph nodes. Hilar regions are difficult to definitively evaluate without IV contrast. Esophagus is grossly unremarkable. Moderate hiatal hernia. Lungs/Pleura: Smudgy triangular-shaped nodule along the minor fissure is likely a benign subpleural lymph  node. A few scattered millimetric pulmonary nodules measure up to 3 mm in the left lower lobe (8/102). No pleural fluid. Airway is unremarkable. Upper Abdomen: Liver is unremarkable. Stones layer in the gallbladder. Visualized portions of the adrenal glands, kidneys, spleen, pancreas, stomach and bowel are unremarkable with the exception of a moderate hiatal hernia. Musculoskeletal: Degenerative changes in the spine. No worrisome lytic or sclerotic lesions. IMPRESSION: 1. No evidence of aortic aneurysm. 2. Millimetric pulmonary nodules. No follow-up needed if patient is low-risk (and has no known or suspected primary neoplasm). Non-contrast chest CT can be considered in 12 months if patient is high-risk. This recommendation follows the consensus statement: Guidelines for Management of Incidental Pulmonary Nodules Detected on CT Images: From the Fleischner Society 2017; Radiology 2017; 284:228-243. 3. Moderate hiatal hernia. 4. Cholelithiasis. 5. Aortic atherosclerosis (ICD10-I70.0). Coronary artery calcification. Electronically Signed   By: Lorin Picket M.D.   On: 01/02/2022 15:10  ? ?VAS US DOPPLER PRE CABG ? ?Result Date: 01/03/2022 ?PREOPERATIVE VASCULAR EVALUATION Patient Name:  Katherine Brooks  Date of Exam:   01/03/2022 Medical Rec #: [id number]      Accession #:    [id number] Date of Birth: May 21, 1936      Patient Gender: F Patient Age:   86 years Exam Location:  [facility] Procedure:      VAS US DOPPLER PRE CABG Referring Phys: Gilford Raid --------------------------------------------------------------------------------  Indications:  Pre-CABG. Risk Factors: Hypertension, hyperlipidemia, coronary artery disease. Performing Technologist: Rogelia Rohrer RVT, RDMS  Examination Guidelines: A complete evaluation includes B-mode imaging, spectral Doppler, color Doppler, and power Doppler as needed of all accessible portions of each vessel. Bilateral testing is considered an integral part of a complete  examination. Limited examinations for reoccurring indications may be performed as noted.  Right Carotid Findings: +----------+--------+--------+--------+------------+--------+           PSV cm/sEDV cm/sStenosisDescribe    Comments +----------+--------+--------+--------+------------+--------+ CCA Prox  64      13                                   +----------+--------+--------+--------+------------+--------+ CCA  Distal103     22                                   +----------+--------+--------+--------+------------+--------+ ICA Prox  106     28      1-39%   heterogenous         +----------+--------+--------+--------+------------+--------+ ICA Distal105     28                          tortuous +----------+--------+--------+--------+------------+--------+ ECA       148             >50%    calcific             +----------+--------+--------+--------+------------+--------+ +----------+--------+-------+---------+------------+           PSV cm/sEDV cmsDescribe Arm Pressure +----------+--------+-------+---------+------------+ Subclavian195            Turbulent             +----------+--------+-------+---------+------------+ +---------+--------+--+--------+--+---------+ VertebralPSV cm/s68EDV cm/s13Antegrade +---------+--------+--+--------+--+---------+ Left Carotid Findings: +----------+--------+--------+--------+------------+--------+           PSV cm/sEDV cm/sStenosisDescribe    Comments +----------+--------+--------+--------+------------+--------+ CCA Prox  108     20                          tortuous +----------+--------+--------+--------+------------+--------+ CCA Distal101     22                                   +----------+--------+--------+--------+------------+--------+ ICA Prox  187     45      40-59%  calcific             +----------+--------+--------+--------+------------+--------+ ICA Mid   161     33                                    +----------+--------+--------+--------+------------+--------+ ICA Distal110     26                          tortuous +----------+--------+--------+--------+------------+--------+ ECA       205     15      >50%    heterogenous         +----------+--------+--------+--------+------------+----

## 2022-01-05 DIAGNOSIS — I25118 Atherosclerotic heart disease of native coronary artery with other forms of angina pectoris: Secondary | ICD-10-CM | POA: Diagnosis not present

## 2022-01-05 LAB — BASIC METABOLIC PANEL
Anion gap: 6 (ref 5–15)
BUN: 23 mg/dL (ref 8–23)
CO2: 23 mmol/L (ref 22–32)
Calcium: 9 mg/dL (ref 8.9–10.3)
Chloride: 108 mmol/L (ref 98–111)
Creatinine, Ser: 1.57 mg/dL — ABNORMAL HIGH (ref 0.44–1.00)
GFR, Estimated: 32 mL/min — ABNORMAL LOW (ref >60)
Glucose, Bld: 99 mg/dL (ref 70–99)
Potassium: 4.3 mmol/L (ref 3.5–5.1)
Sodium: 137 mmol/L (ref 135–145)

## 2022-01-05 LAB — CBC
HCT: 35.9 % — ABNORMAL LOW (ref 36.0–46.0)
Hemoglobin: 11.6 g/dL — ABNORMAL LOW (ref 12.0–15.0)
MCH: 28.8 pg (ref 26.0–34.0)
MCHC: 32.3 g/dL (ref 30.0–36.0)
MCV: 89.1 fL (ref 80.0–100.0)
Platelets: 201 10*3/uL (ref 150–400)
RBC: 4.03 MIL/uL (ref 3.87–5.11)
RDW: 13.6 % (ref 11.5–15.5)
WBC: 8.2 10*3/uL (ref 4.0–10.5)
nRBC: 0 % (ref 0.0–0.2)

## 2022-01-05 LAB — HEPARIN LEVEL (UNFRACTIONATED): Heparin Unfractionated: 0.57 IU/mL (ref 0.30–0.70)

## 2022-01-05 NOTE — Progress Notes (Signed)
ANTICOAGULATION CONSULT NOTE - Follow Up Consult ? ?Pharmacy Consult for Heparin ?Indication: chest pain/ACS ? ?Allergies  ?Allergen Reactions  ? Lisinopril Other (See Comments)  ?  "passed out" ?Other reaction(s): Other (See Comments), Other (See Comments) ?"passed out" ?"I passed out"  ? Carbamazepine Rash  ? ? ?Patient Measurements: ?Height: 5' (152.4 cm) ?Weight: 79 kg (174 lb 2.6 oz) ?IBW/kg (Calculated) : 45.5 ?Heparin Dosing Weight: 63 kg ? ?Vital Signs: ?Temp: 98 ?F (36.7 ?C) (04/29 0503) ?Temp Source: Oral (04/29 0503) ?BP: 109/58 (04/29 0855) ?Pulse Rate: 72 (04/29 0855) ? ?Labs: ?Recent Labs  ?  01/03/22 ?0300 01/03/22 ?1945 01/04/22 ?2956 01/04/22 ?1335 01/05/22 ?0205  ?HGB 11.6*  --  12.0  --  11.6*  ?HCT 36.8  --  36.3  --  35.9*  ?PLT 223  --  202  --  201  ?HEPARINUNFRC  --    < > 0.41 0.46 0.57  ?CREATININE 1.89*  --  1.67*  --  1.57*  ? < > = values in this interval not displayed.  ? ? ? ?Estimated Creatinine Clearance: 24.8 mL/min (A) (by C-G formula based on SCr of 1.57 mg/dL (H)). ? ?Assessment: ?86 yo female s/p cath 4/26 with multivessel CAD to started IV heparin 4/27 am. Plans are noted for CABG on Monday. No anticoagulants noted PTA.  ?-heparin level therapeutic ? ?Goal of Therapy:  ?Heparin level 0.3-0.7 units/ml ?Monitor platelets by anticoagulation protocol: Yes ?  ?Plan:  ?Continue heparin at 950 units/hr ?Daily -heparin level and CBC. ? ?Onnie Boer, PharmD, BCIDP, AAHIVP, CPP ?Infectious Disease Pharmacist ?01/05/2022 11:37 AM ? ? ? ? ? ?

## 2022-01-05 NOTE — Progress Notes (Signed)
? ?Progress Note ? ?Patient Name: Katherine Brooks ?Date of Encounter: 01/05/2022 ? ?[facility] Cardiologist: Ida Rogue, MD  ? ?Subjective  ? ?Doing well no chest pain no shortness of breath.  Original symptoms were shortness of breath when going out into the woods or taking her garbage cans in ? ?Inpatient Medications  ?  ?Scheduled Meds: ? aspirin EC  81 mg Oral Daily  ? atorvastatin  80 mg Oral Daily  ? carvedilol  3.125 mg Oral BID WC  ? [START ON 01/07/2022] epinephrine  0-10 mcg/min Intravenous To OR  ? [START ON 01/07/2022] heparin-papaverine-plasmalyte irrigation   Irrigation To OR  ? [START ON 01/07/2022] insulin   Intravenous To OR  ? [START ON 01/07/2022] magnesium sulfate  40 mEq Other To OR  ? [START ON 01/07/2022] phenylephrine  30-200 mcg/min Intravenous To OR  ? [START ON 01/07/2022] potassium chloride  80 mEq Other To OR  ? [START ON 01/07/2022] tranexamic acid  15 mg/kg Intravenous To OR  ? [START ON 01/07/2022] tranexamic acid  2 mg/kg Intracatheter To OR  ? ?Continuous Infusions: ? [START ON 01/07/2022]  ceFAZolin (ANCEF) IV    ? [START ON 01/07/2022] dexmedetomidine    ? [START ON 01/07/2022] heparin 30,000 units/NS 1000 mL solution for CELLSAVER    ? heparin 950 Units/hr (01/05/22 0448)  ? [START ON 01/07/2022] milrinone    ? [START ON 01/07/2022] nitroGLYCERIN    ? [START ON 01/07/2022] norepinephrine    ? [START ON 01/07/2022] tranexamic acid (CYKLOKAPRON) infusion ([facility])    ? [START ON 01/07/2022] vancomycin    ? ?PRN Meds: ?acetaminophen, nitroGLYCERIN, ondansetron (ZOFRAN) IV  ? ?Vital Signs  ?  ?Vitals:  ? 01/04/22 1702 01/04/22 2028 01/05/22 0503 01/05/22 0855  ?BP: (!) 112/93 (!) 123/51 114/65 (!) 109/58  ?Pulse: 60 60 67 72  ?Resp: '13 14 14 19  '$ ?Temp: 97.6 ?F (36.4 ?C) 98 ?F (36.7 ?C) 98 ?F (36.7 ?C)   ?TempSrc: Oral Oral Oral   ?SpO2: 97% 99% 99%   ?Weight:   79 kg   ?Height:      ? ? ?Intake/Output Summary (Last 24 hours) at 01/05/2022 0910 ?Last data filed at 01/05/2022 0448 ?Gross per 24 hour  ?Intake 358.54  ml  ?Output --  ?Net 358.54 ml  ? ? ?  01/05/2022  ?  5:03 AM 01/04/2022  ?  3:50 AM 01/03/2022  ?  4:45 AM  ?Last 3 Weights  ?Weight (lbs) 174 lb 2.6 oz 173 lb 3.2 oz 173 lb 11.2 oz  ?Weight (kg) 79 kg 78.563 kg 78.79 kg  ?   ? ?Telemetry  ?  ?Sinus rhythm 60s- Personally Reviewed ? ?ECG  ?  ?No new- Personally Reviewed ? ?Physical Exam  ? ?GEN: No acute distress.   ?Neck: No JVD ?Cardiac: RRR, no murmurs, rubs, or gallops.  ?Respiratory: Clear to auscultation bilaterally. ?GI: Soft, nontender, non-distended  ?MS: No edema; No deformity. ?Neuro:  Nonfocal  ?Psych: Normal affect  ? ?Labs  ?  ?High Sensitivity Troponin:  No results for input(s): TROPONINIHS in the last 720 hours.   ?Chemistry ?Recent Labs  ?Lab 12/31/21 ?1004 12/31/21 ?1051 01/03/22 ?0300 01/04/22 ?1751 01/05/22 ?0205  ?NA 136   < > 136 137 137  ?K 4.8   < > 4.9 4.7 4.3  ?CL 106   < > 106 108 108  ?CO2 22   < > '23 23 23  '$ ?GLUCOSE 113*   < > 90 102* 99  ?  BUN 43*   < > 35* 27* 23  ?CREATININE 1.72*   < > 1.89* 1.67* 1.57*  ?CALCIUM 10.1   < > 8.9 8.9 9.0  ?PROT 8.0  --   --   --   --   ?ALBUMIN 4.6  --   --   --   --   ?AST 20  --   --   --   --   ?ALT 13  --   --   --   --   ?ALKPHOS 58  --   --   --   --   ?BILITOT 0.7  --   --   --   --   ?GFRNONAA 29*   < > 26* 30* 32*  ?ANIONGAP 8   < > '7 6 6  '$ ? < > = values in this interval not displayed.  ?  ?Lipids  ?Recent Labs  ?Lab 01/03/22 ?0300  ?CHOL 165  ?TRIG 56  ?HDL 48  ?LDLCALC 106*  ?CHOLHDL 3.4  ?  ?Hematology ?Recent Labs  ?Lab 01/03/22 ?0300 01/04/22 ?1093 01/05/22 ?0205  ?WBC 8.4 7.6 8.2  ?RBC 4.08 4.08 4.03  ?HGB 11.6* 12.0 11.6*  ?HCT 36.8 36.3 35.9*  ?MCV 90.2 89.0 89.1  ?MCH 28.4 29.4 28.8  ?MCHC 31.5 33.1 32.3  ?RDW 13.8 13.6 13.6  ?PLT 223 202 201  ? ?Thyroid No results for input(s): TSH, FREET4 in the last 168 hours.  ?BNPNo results for input(s): BNP, PROBNP in the last 168 hours.  ?DDimer No results for input(s): DDIMER in the last 168 hours.  ? ?Radiology  ?  ?VAS US DOPPLER PRE  CABG ? ?Result Date: 01/03/2022 ?PREOPERATIVE VASCULAR EVALUATION Patient Name:  Katherine Brooks  Date of Exam:   01/03/2022 Medical Rec #: [id number]      Accession #:    [id number] Date of Birth: May 09, 1936      Patient Gender: F Patient Age:   36 years Exam Location:  [facility] Procedure:      VAS US DOPPLER PRE CABG Referring Phys: Gilford Raid --------------------------------------------------------------------------------  Indications:  Pre-CABG. Risk Factors: Hypertension, hyperlipidemia, coronary artery disease. Performing Technologist: Rogelia Rohrer RVT, RDMS  Examination Guidelines: A complete evaluation includes B-mode imaging, spectral Doppler, color Doppler, and power Doppler as needed of all accessible portions of each vessel. Bilateral testing is considered an integral part of a complete examination. Limited examinations for reoccurring indications may be performed as noted.  Right Carotid Findings: +----------+--------+--------+--------+------------+--------+           PSV cm/sEDV cm/sStenosisDescribe    Comments +----------+--------+--------+--------+------------+--------+ CCA Prox  64      13                                   +----------+--------+--------+--------+------------+--------+ CCA Distal103     22                                   +----------+--------+--------+--------+------------+--------+ ICA Prox  106     28      1-39%   heterogenous         +----------+--------+--------+--------+------------+--------+ ICA Distal105     28                          tortuous +----------+--------+--------+--------+------------+--------+ ECA  148             >50%    calcific             +----------+--------+--------+--------+------------+--------+ +----------+--------+-------+---------+------------+           PSV cm/sEDV cmsDescribe Arm Pressure +----------+--------+-------+---------+------------+ Subclavian195            Turbulent              +----------+--------+-------+---------+------------+ +---------+--------+--+--------+--+---------+ VertebralPSV cm/s68EDV cm/s13Antegrade +---------+--------+--+--------+--+---------+ Left Carotid Findings: +----------+--------+--------+--------+------------+--------+           PSV cm/sEDV cm/sStenosisDescribe    Comments +----------+--------+--------+--------+------------+--------+ CCA Prox  108     20                          tortuous +----------+--------+--------+--------+------------+--------+ CCA Distal101     22                                   +----------+--------+--------+--------+------------+--------+ ICA Prox  187     45      40-59%  calcific             +----------+--------+--------+--------+------------+--------+ ICA Mid   161     33                                   +----------+--------+--------+--------+------------+--------+ ICA Distal110     26                          tortuous +----------+--------+--------+--------+------------+--------+ ECA       205     15      >50%    heterogenous         +----------+--------+--------+--------+------------+--------+ +----------+--------+--------+----------------+------------+ SubclavianPSV cm/sEDV cm/sDescribe        Arm Pressure +----------+--------+--------+----------------+------------+                           Multiphasic, WNL             +----------+--------+--------+----------------+------------+ +---------+--------+--+--------+--+---------+ VertebralPSV cm/s63EDV cm/s12Antegrade +---------+--------+--+--------+--+---------+  ABI Findings: +--------+------------------+-----+---------+--------+ Right   Rt Pressure (mmHg)IndexWaveform Comment  +--------+------------------+-----+---------+--------+ [id number]                    triphasic         +--------+------------------+-----+---------+--------+ PTA     140               1.00 triphasic          +--------+------------------+-----+---------+--------+ DP      146               1.04 triphasic         +--------+------------------+-----+---------+--------+ +--------+------------------+-----+---------+-------+ Left    Lt Pressure (mmHg)IndexWaveform Comment +--------+------------------+-----+---------+-------+ Brachial140                    triphasic        +--------+----

## 2022-01-06 ENCOUNTER — Inpatient Hospital Stay (HOSPITAL_COMMUNITY): Payer: Medicare PPO

## 2022-01-06 DIAGNOSIS — I25118 Atherosclerotic heart disease of native coronary artery with other forms of angina pectoris: Secondary | ICD-10-CM | POA: Diagnosis not present

## 2022-01-06 LAB — BLOOD GAS, ARTERIAL
Acid-base deficit: 1.3 mmol/L (ref 0.0–2.0)
Bicarbonate: 22.7 mmol/L (ref 20.0–28.0)
Drawn by: 164
O2 Saturation: 96.7 %
Patient temperature: 37
pCO2 arterial: 35 mmHg (ref 32–48)
pH, Arterial: 7.42 (ref 7.35–7.45)
pO2, Arterial: 78 mmHg — ABNORMAL LOW (ref 83–108)

## 2022-01-06 LAB — URINALYSIS, ROUTINE W REFLEX MICROSCOPIC
Bilirubin Urine: NEGATIVE
Glucose, UA: NEGATIVE mg/dL
Hgb urine dipstick: NEGATIVE
Ketones, ur: NEGATIVE mg/dL
Nitrite: NEGATIVE
Protein, ur: NEGATIVE mg/dL
Specific Gravity, Urine: 1.009 (ref 1.005–1.030)
pH: 6 (ref 5.0–8.0)

## 2022-01-06 LAB — SURGICAL PCR SCREEN
MRSA, PCR: NEGATIVE
Staphylococcus aureus: NEGATIVE

## 2022-01-06 LAB — PREPARE RBC (CROSSMATCH)

## 2022-01-06 LAB — CBC
HCT: 35.3 % — ABNORMAL LOW (ref 36.0–46.0)
Hemoglobin: 11.4 g/dL — ABNORMAL LOW (ref 12.0–15.0)
MCH: 28.6 pg (ref 26.0–34.0)
MCHC: 32.3 g/dL (ref 30.0–36.0)
MCV: 88.7 fL (ref 80.0–100.0)
Platelets: 168 10*3/uL (ref 150–400)
RBC: 3.98 MIL/uL (ref 3.87–5.11)
RDW: 13.6 % (ref 11.5–15.5)
WBC: 8 10*3/uL (ref 4.0–10.5)
nRBC: 0 % (ref 0.0–0.2)

## 2022-01-06 LAB — PROTIME-INR
INR: 1.2 (ref 0.8–1.2)
Prothrombin Time: 14.7 seconds (ref 11.4–15.2)

## 2022-01-06 LAB — ABO/RH: ABO/RH(D): A POS

## 2022-01-06 LAB — HEMOGLOBIN A1C
Hgb A1c MFr Bld: 5.5 % (ref 4.8–5.6)
Mean Plasma Glucose: 111.15 mg/dL

## 2022-01-06 LAB — SARS CORONAVIRUS 2 BY RT PCR: SARS Coronavirus 2 by RT PCR: NEGATIVE

## 2022-01-06 LAB — APTT: aPTT: 115 seconds — ABNORMAL HIGH (ref 24–36)

## 2022-01-06 LAB — HEPARIN LEVEL (UNFRACTIONATED): Heparin Unfractionated: 0.55 IU/mL (ref 0.30–0.70)

## 2022-01-06 MED ORDER — TEMAZEPAM 15 MG PO CAPS
15.0000 mg | ORAL_CAPSULE | Freq: Once | ORAL | Status: DC | PRN
  Filled 2022-01-06: qty 1

## 2022-01-06 MED ORDER — CHLORHEXIDINE GLUCONATE CLOTH 2 % EX PADS
6.0000 | MEDICATED_PAD | Freq: Once | CUTANEOUS | Status: AC
  Administered 2022-01-06: 6 via TOPICAL

## 2022-01-06 MED ORDER — METOPROLOL TARTRATE 12.5 MG HALF TABLET
12.5000 mg | ORAL_TABLET | Freq: Once | ORAL | Status: AC
  Administered 2022-01-07: 12.5 mg via ORAL
  Filled 2022-01-06: qty 1

## 2022-01-06 MED ORDER — BISACODYL 5 MG PO TBEC: 5.0000 mg | DELAYED_RELEASE_TABLET | Freq: Once | ORAL | Status: DC

## 2022-01-06 MED ORDER — DIAZEPAM 2 MG PO TABS
2.0000 mg | ORAL_TABLET | Freq: Once | ORAL | Status: AC
  Administered 2022-01-07: 2 mg via ORAL
  Filled 2022-01-06: qty 1

## 2022-01-06 MED ORDER — CHLORHEXIDINE GLUCONATE 0.12 % MT SOLN
15.0000 mL | Freq: Once | OROMUCOSAL | Status: AC
  Administered 2022-01-07: 15 mL via OROMUCOSAL
  Filled 2022-01-06: qty 15

## 2022-01-06 MED ORDER — CHLORHEXIDINE GLUCONATE CLOTH 2 % EX PADS
6.0000 | MEDICATED_PAD | Freq: Once | CUTANEOUS | Status: AC
  Administered 2022-01-07: 6 via TOPICAL

## 2022-01-06 NOTE — Progress Notes (Signed)
ABG collected and sent to lab by RT. Lab notified RT will continue to monitor.  ?

## 2022-01-06 NOTE — Progress Notes (Signed)
ANTICOAGULATION CONSULT NOTE - Follow Up Consult ? ?Pharmacy Consult for Heparin ?Indication: chest pain/ACS ? ?Allergies  ?Allergen Reactions  ? Lisinopril Other (See Comments)  ?  "passed out" ?Other reaction(s): Other (See Comments), Other (See Comments) ?"passed out" ?"I passed out"  ? Carbamazepine Rash  ? ? ?Patient Measurements: ?Height: 5' (152.4 cm) ?Weight: 78.9 kg (173 lb 15.1 oz) ?IBW/kg (Calculated) : 45.5 ?Heparin Dosing Weight: 63 kg ? ?Vital Signs: ?Temp: 98.3 ?F (36.8 ?C) (04/30 0443) ?Temp Source: Oral (04/30 0443) ?BP: 116/51 (04/30 0443) ?Pulse Rate: 64 (04/30 0443) ? ?Labs: ?Recent Labs  ?  01/04/22 ?5615 01/04/22 ?1335 01/05/22 ?0205 01/06/22 ?0325  ?HGB 12.0  --  11.6* 11.4*  ?HCT 36.3  --  35.9* 35.3*  ?PLT 202  --  201 168  ?HEPARINUNFRC 0.41 0.46 0.57 0.55  ?CREATININE 1.67*  --  1.57*  --   ? ? ? ?Estimated Creatinine Clearance: 24.8 mL/min (A) (by C-G formula based on SCr of 1.57 mg/dL (H)). ? ?Assessment: ?86 yo female s/p cath 4/26 with multivessel CAD to started IV heparin 4/27 am. Plans are noted for CABG on Monday. No anticoagulants noted PTA.  ?-heparin level remains therapeutic ? ?Goal of Therapy:  ?Heparin level 0.3-0.7 units/ml ?Monitor platelets by anticoagulation protocol: Yes ?  ?Plan:  ?Continue heparin at 950 units/hr ?Daily -heparin level and CBC. ? ? ?Thank you for allowing [place] to participate in this patients care. ?Jens Som, PharmD ?01/06/2022 7:47 AM ? ?**Pharmacist phone directory can be found on [email or web address] listed under [place]** ? ? ? ? ? ? ?

## 2022-01-06 NOTE — Anesthesia Preprocedure Evaluation (Addendum)
Anesthesia Evaluation  ?Patient identified by MRN, date of birth, ID band ?Patient awake ? ? ? ?Reviewed: ?Allergy & Precautions, NPO status , Patient's Chart, lab work & pertinent test results ? ?Airway ?Mallampati: II ? ?TM Distance: >3 FB ? ? ? ? Dental ?  ?Pulmonary ?neg pulmonary ROS,  ?  ?breath sounds clear to auscultation ? ? ? ? ? ? Cardiovascular ?hypertension, + CAD and + DOE  ?+ dysrhythmias  ?Rhythm:Regular Rate:Normal ? ? ?  ?Neuro/Psych ?negative neurological ROS ?   ? GI/Hepatic ?negative GI ROS, Neg liver ROS,   ?Endo/Other  ? ? Renal/GU ?Renal disease  ? ?  ?Musculoskeletal ? ? Abdominal ?  ?Peds ? Hematology ?  ?Anesthesia Other Findings ? ? Reproductive/Obstetrics ? ?  ? ? ? ? ? ? ? ? ? ? ? ? ? ?  ?  ? ? ? ? ? ? ? ?Anesthesia Physical ?Anesthesia Plan ? ?ASA: 3 ? ?Anesthesia Plan: General  ? ?Post-op Pain Management:   ? ?Induction: Intravenous ? ?PONV Risk Score and Plan: 3 and Ondansetron, Dexamethasone and Midazolam ? ?Airway Management Planned: Oral ETT ? ?Additional Equipment: Arterial line, TEE, PA Cath and Ultrasound Guidance Line Placement ? ?Intra-op Plan:  ? ?Post-operative Plan: Post-operative intubation/ventilation ? ?Informed Consent: I have reviewed the patients History and Physical, chart, labs and discussed the procedure including the risks, benefits and alternatives for the proposed anesthesia with the patient or authorized representative who has indicated his/her understanding and acceptance.  ? ? ? ?Dental advisory given ? ?Plan Discussed with: Anesthesiologist and CRNA ? ?Anesthesia Plan Comments:   ? ? ? ? ? ?Anesthesia Quick Evaluation ? ?

## 2022-01-06 NOTE — Progress Notes (Signed)
? ?Progress Note ? ?Patient Name: Katherine Brooks ?Date of Encounter: 01/06/2022 ? ?[facility] Cardiologist: Ida Rogue, MD  ? ?Subjective  ? ?Original symptoms were shortness of breath when going out into the woods or taking her garbage cans in. ? ?Doing well today.  No chest pain no shortness of breath ? ?Inpatient Medications  ?  ?Scheduled Meds: ? aspirin EC  81 mg Oral Daily  ? atorvastatin  80 mg Oral Daily  ? carvedilol  3.125 mg Oral BID WC  ? [START ON 01/07/2022] epinephrine  0-10 mcg/min Intravenous To OR  ? [START ON 01/07/2022] heparin-papaverine-plasmalyte irrigation   Irrigation To OR  ? [START ON 01/07/2022] insulin   Intravenous To OR  ? [START ON 01/07/2022] magnesium sulfate  40 mEq Other To OR  ? [START ON 01/07/2022] phenylephrine  30-200 mcg/min Intravenous To OR  ? [START ON 01/07/2022] potassium chloride  80 mEq Other To OR  ? [START ON 01/07/2022] tranexamic acid  15 mg/kg Intravenous To OR  ? [START ON 01/07/2022] tranexamic acid  2 mg/kg Intracatheter To OR  ? ?Continuous Infusions: ? [START ON 01/07/2022]  ceFAZolin (ANCEF) IV    ? [START ON 01/07/2022] dexmedetomidine    ? [START ON 01/07/2022] heparin 30,000 units/NS 1000 mL solution for CELLSAVER    ? heparin 950 Units/hr (01/05/22 2052)  ? [START ON 01/07/2022] milrinone    ? [START ON 01/07/2022] nitroGLYCERIN    ? [START ON 01/07/2022] norepinephrine    ? [START ON 01/07/2022] tranexamic acid (CYKLOKAPRON) infusion ([facility])    ? [START ON 01/07/2022] vancomycin    ? ?PRN Meds: ?acetaminophen, nitroGLYCERIN, ondansetron (ZOFRAN) IV  ? ?Vital Signs  ?  ?Vitals:  ? 01/05/22 1411 01/05/22 1829 01/05/22 2052 01/06/22 0443  ?BP: (!) 107/52 (!) 135/52 116/63 (!) 116/51  ?Pulse: (!) 58 62 63 64  ?Resp: '17  17 11  '$ ?Temp: 97.8 ?F (36.6 ?C)  98.5 ?F (36.9 ?C) 98.3 ?F (36.8 ?C)  ?TempSrc: Oral  Oral Oral  ?SpO2: 98%  98% 96%  ?Weight:    78.9 kg  ?Height:      ? ? ?Intake/Output Summary (Last 24 hours) at 01/06/2022 1002 ?Last data filed at 01/05/2022 2100 ?Gross per 24 hour   ?Intake 480 ml  ?Output --  ?Net 480 ml  ? ? ?  01/06/2022  ?  4:43 AM 01/05/2022  ?  5:03 AM 01/04/2022  ?  3:50 AM  ?Last 3 Weights  ?Weight (lbs) 173 lb 15.1 oz 174 lb 2.6 oz 173 lb 3.2 oz  ?Weight (kg) 78.9 kg 79 kg 78.563 kg  ?   ? ?Telemetry  ?  ?Sinus rhythm 60s, no changes- Personally Reviewed ? ?ECG  ?  ?No new- Personally Reviewed ? ?Physical Exam  ? ?GEN: No acute distress.   ?Neck: No JVD ?Cardiac: RRR, no murmurs, rubs, or gallops.  ?Respiratory: Clear to auscultation bilaterally. ?GI: Soft, nontender, non-distended  ?MS: No edema; No deformity. ?Neuro:  Nonfocal  ?Psych: Normal affect  ? ?Labs  ?  ?High Sensitivity Troponin:  No results for input(s): TROPONINIHS in the last 720 hours.   ?Chemistry ?Recent Labs  ?Lab 12/31/21 ?1004 12/31/21 ?1051 01/03/22 ?0300 01/04/22 ?6384 01/05/22 ?0205  ?NA 136   < > 136 137 137  ?K 4.8   < > 4.9 4.7 4.3  ?CL 106   < > 106 108 108  ?CO2 22   < > '23 23 23  '$ ?GLUCOSE 113*   < >  90 102* 99  ?BUN 43*   < > 35* 27* 23  ?CREATININE 1.72*   < > 1.89* 1.67* 1.57*  ?CALCIUM 10.1   < > 8.9 8.9 9.0  ?PROT 8.0  --   --   --   --   ?ALBUMIN 4.6  --   --   --   --   ?AST 20  --   --   --   --   ?ALT 13  --   --   --   --   ?ALKPHOS 58  --   --   --   --   ?BILITOT 0.7  --   --   --   --   ?GFRNONAA 29*   < > 26* 30* 32*  ?ANIONGAP 8   < > '7 6 6  '$ ? < > = values in this interval not displayed.  ?  ?Lipids  ?Recent Labs  ?Lab 01/03/22 ?0300  ?CHOL 165  ?TRIG 56  ?HDL 48  ?LDLCALC 106*  ?CHOLHDL 3.4  ?  ?Hematology ?Recent Labs  ?Lab 01/04/22 ?4782 01/05/22 ?0205 01/06/22 ?0325  ?WBC 7.6 8.2 8.0  ?RBC 4.08 4.03 3.98  ?HGB 12.0 11.6* 11.4*  ?HCT 36.3 35.9* 35.3*  ?MCV 89.0 89.1 88.7  ?MCH 29.4 28.8 28.6  ?MCHC 33.1 32.3 32.3  ?RDW 13.6 13.6 13.6  ?PLT 202 201 168  ? ?Thyroid No results for input(s): TSH, FREET4 in the last 168 hours.  ?BNPNo results for input(s): BNP, PROBNP in the last 168 hours.  ?DDimer No results for input(s): DDIMER in the last 168 hours.  ? ?Radiology  ?  ?No  results found. ? ?Cardiac Studies  ? ?  ?Cath: 01/01/22 ?  ?Conclusions: ?Severe multivessel coronary artery disease, including 60% ostial and 90% distal LMCA stenoses extending into the ostium of the LAD, sequential 90% proximal and 60% mid LAD lesion, and 90% proximal LCx and RCA stenoses. ?Upper normal left ventricular filling pressure (LVEDP 15 mmHg). ?  ?Recommendations: ?Transfer to [facility] for inpatient cardiac surgery consultation, given severity of coronary artery disease and reduced LVEF. ?Aggressive secondary prevention of coronary artery disease. ?Optimize goal-directed medical therapy in the setting of chronic HFrEF due to ischemic cardiomyopathy. ?Gentle post-catheterization hydration, given chronic kidney disease. ?  ?Nelva Bush, MD ?[place] ?  ?Diagnostic ?Dominance: Right ?  ?Echo: 11/07/21 ?  ?IMPRESSIONS  ? ? ? 1. Left ventricular ejection fraction, by estimation, is 30 to 35%. The  ?left ventricle has moderate to severely decreased function. The left  ?ventricle demonstrates global hypokinesis. Left ventricular diastolic  ?parameters are consistent with Grade I  ?diastolic dysfunction (impaired relaxation).  ? 2. Right ventricular systolic function is normal. The right ventricular  ?size is normal.  ? 3. Left atrial size was mildly dilated.  ? 4. The mitral valve is normal in structure. Trivial mitral valve  ?regurgitation.  ? 5. The aortic valve is tricuspid. Aortic valve regurgitation is not  ?visualized.  ?  ? ?Patient Profile  ?   ?86 y.o. female awaiting CABG. ? ?Assessment & Plan  ?  ?Coronary artery disease ?- Multivessel coronary artery disease as above.  Goal-directed medical therapy, statin, aspirin, carvedilol, IV heparin. ?- CABG date 01/07/2022, no changes in therapy today ? ?Chronic systolic heart failure secondary to ischemic cardiomyopathy ?- EF 30 to 35%, beta-blocker, Entresto and Lasix were both held because of marginal blood pressure and chronic kidney disease stage  IV.  Continue to monitor.  BP fairly soft now.  Appears euvolemic. ?- Plan is to resume goal-directed medical therapy after CABG.  No changes in therapy ? ?Chronic kidney disease stage IV ?- Baseline creatinine 1.7, holding Entresto and Lasix.  1.5 today. ? ?For questions or updates, please contact [facility] ?Please consult [email or web address] for contact info under  ? ?  ?   ?Signed, ?Candee Furbish, MD  ?01/06/2022, 10:02 AM   ? ?

## 2022-01-07 ENCOUNTER — Inpatient Hospital Stay (HOSPITAL_COMMUNITY): Payer: Medicare PPO

## 2022-01-07 ENCOUNTER — Inpatient Hospital Stay (HOSPITAL_COMMUNITY): Payer: Medicare PPO | Admitting: Anesthesiology

## 2022-01-07 ENCOUNTER — Inpatient Hospital Stay (HOSPITAL_COMMUNITY)
Admission: AD | Disposition: A | Discharge status: Home / Self Care | Payer: Self-pay | Source: Other Acute Inpatient Hospital | Attending: Surgery

## 2022-01-07 DIAGNOSIS — N289 Disorder of kidney and ureter, unspecified: Secondary | ICD-10-CM

## 2022-01-07 DIAGNOSIS — I2510 Atherosclerotic heart disease of native coronary artery without angina pectoris: Secondary | ICD-10-CM

## 2022-01-07 DIAGNOSIS — I10 Essential (primary) hypertension: Secondary | ICD-10-CM

## 2022-01-07 HISTORY — PX: CORONARY ARTERY BYPASS GRAFT: SHX141

## 2022-01-07 HISTORY — PX: TEE WITHOUT CARDIOVERSION: SHX5443

## 2022-01-07 LAB — POCT I-STAT 7, (LYTES, BLD GAS, ICA,H+H)
Acid-base deficit: 4 mmol/L — ABNORMAL HIGH (ref 0.0–2.0)
Acid-base deficit: 5 mmol/L — ABNORMAL HIGH (ref 0.0–2.0)
Acid-base deficit: 2 mmol/L (ref 0.0–2.0)
Acid-base deficit: 4 mmol/L — ABNORMAL HIGH (ref 0.0–2.0)
Acid-base deficit: 5 mmol/L — ABNORMAL HIGH (ref 0.0–2.0)
Acid-base deficit: 2 mmol/L (ref 0.0–2.0)
Acid-base deficit: 2 mmol/L (ref 0.0–2.0)
Bicarbonate: 20.2 mmol/L (ref 20.0–28.0)
Bicarbonate: 21.6 mmol/L (ref 20.0–28.0)
Bicarbonate: 21.9 mmol/L (ref 20.0–28.0)
Bicarbonate: 19.9 mmol/L — ABNORMAL LOW (ref 20.0–28.0)
Bicarbonate: 21.7 mmol/L (ref 20.0–28.0)
Bicarbonate: 23.5 mmol/L (ref 20.0–28.0)
Bicarbonate: 23.3 mmol/L (ref 20.0–28.0)
Calcium, Ion: 1.03 mmol/L — ABNORMAL LOW (ref 1.15–1.40)
Calcium, Ion: 1.06 mmol/L — ABNORMAL LOW (ref 1.15–1.40)
Calcium, Ion: 1.01 mmol/L — ABNORMAL LOW (ref 1.15–1.40)
Calcium, Ion: 1.03 mmol/L — ABNORMAL LOW (ref 1.15–1.40)
Calcium, Ion: 1.11 mmol/L — ABNORMAL LOW (ref 1.15–1.40)
Calcium, Ion: 1.09 mmol/L — ABNORMAL LOW (ref 1.15–1.40)
Calcium, Ion: 1.12 mmol/L — ABNORMAL LOW (ref 1.15–1.40)
HCT: 23 % — ABNORMAL LOW (ref 36.0–46.0)
HCT: 23 % — ABNORMAL LOW (ref 36.0–46.0)
HCT: 24 % — ABNORMAL LOW (ref 36.0–46.0)
HCT: 26 % — ABNORMAL LOW (ref 36.0–46.0)
HCT: 31 % — ABNORMAL LOW (ref 36.0–46.0)
HCT: 23 % — ABNORMAL LOW (ref 36.0–46.0)
HCT: 25 % — ABNORMAL LOW (ref 36.0–46.0)
Hemoglobin: 7.8 g/dL — ABNORMAL LOW (ref 12.0–15.0)
Hemoglobin: 7.8 g/dL — ABNORMAL LOW (ref 12.0–15.0)
Hemoglobin: 8.2 g/dL — ABNORMAL LOW (ref 12.0–15.0)
Hemoglobin: 8.8 g/dL — ABNORMAL LOW (ref 12.0–15.0)
Hemoglobin: 10.5 g/dL — ABNORMAL LOW (ref 12.0–15.0)
Hemoglobin: 7.8 g/dL — ABNORMAL LOW (ref 12.0–15.0)
Hemoglobin: 8.5 g/dL — ABNORMAL LOW (ref 12.0–15.0)
O2 Saturation: 100 %
O2 Saturation: 87 %
O2 Saturation: 100 %
O2 Saturation: 100 %
O2 Saturation: 98 %
O2 Saturation: 98 %
O2 Saturation: 98 %
Patient temperature: 35.7
Potassium: 5.6 mmol/L — ABNORMAL HIGH (ref 3.5–5.1)
Potassium: 7.3 mmol/L (ref 3.5–5.1)
Potassium: 7 mmol/L (ref 3.5–5.1)
Potassium: 5.4 mmol/L — ABNORMAL HIGH (ref 3.5–5.1)
Potassium: 4.3 mmol/L (ref 3.5–5.1)
Potassium: 5.1 mmol/L (ref 3.5–5.1)
Potassium: 4.8 mmol/L (ref 3.5–5.1)
Sodium: 131 mmol/L — ABNORMAL LOW (ref 135–145)
Sodium: 134 mmol/L — ABNORMAL LOW (ref 135–145)
Sodium: 135 mmol/L (ref 135–145)
Sodium: 137 mmol/L (ref 135–145)
Sodium: 137 mmol/L (ref 135–145)
Sodium: 137 mmol/L (ref 135–145)
Sodium: 137 mmol/L (ref 135–145)
TCO2: 21 mmol/L — ABNORMAL LOW (ref 22–32)
TCO2: 23 mmol/L (ref 22–32)
TCO2: 23 mmol/L (ref 22–32)
TCO2: 21 mmol/L — ABNORMAL LOW (ref 22–32)
TCO2: 23 mmol/L (ref 22–32)
TCO2: 25 mmol/L (ref 22–32)
TCO2: 24 mmol/L (ref 22–32)
pCO2 arterial: 37.6 mmHg (ref 32–48)
pCO2 arterial: 41.4 mmHg (ref 32–48)
pCO2 arterial: 31.6 mmHg — ABNORMAL LOW (ref 32–48)
pCO2 arterial: 32.6 mmHg (ref 32–48)
pCO2 arterial: 41.7 mmHg (ref 32–48)
pCO2 arterial: 39.7 mmHg (ref 32–48)
pCO2 arterial: 39.7 mmHg (ref 32–48)
pH, Arterial: 7.326 — ABNORMAL LOW (ref 7.35–7.45)
pH, Arterial: 7.339 — ABNORMAL LOW (ref 7.35–7.45)
pH, Arterial: 7.449 (ref 7.35–7.45)
pH, Arterial: 7.393 (ref 7.35–7.45)
pH, Arterial: 7.317 — ABNORMAL LOW (ref 7.35–7.45)
pH, Arterial: 7.379 (ref 7.35–7.45)
pH, Arterial: 7.376 (ref 7.35–7.45)
pO2, Arterial: 450 mmHg — ABNORMAL HIGH (ref 83–108)
pO2, Arterial: 56 mmHg — ABNORMAL LOW (ref 83–108)
pO2, Arterial: 373 mmHg — ABNORMAL HIGH (ref 83–108)
pO2, Arterial: 247 mmHg — ABNORMAL HIGH (ref 83–108)
pO2, Arterial: 114 mmHg — ABNORMAL HIGH (ref 83–108)
pO2, Arterial: 108 mmHg (ref 83–108)
pO2, Arterial: 116 mmHg — ABNORMAL HIGH (ref 83–108)

## 2022-01-07 LAB — GLUCOSE, CAPILLARY
Glucose-Capillary: 157 mg/dL — ABNORMAL HIGH (ref 70–99)
Glucose-Capillary: 142 mg/dL — ABNORMAL HIGH (ref 70–99)
Glucose-Capillary: 139 mg/dL — ABNORMAL HIGH (ref 70–99)
Glucose-Capillary: 130 mg/dL — ABNORMAL HIGH (ref 70–99)
Glucose-Capillary: 138 mg/dL — ABNORMAL HIGH (ref 70–99)
Glucose-Capillary: 124 mg/dL — ABNORMAL HIGH (ref 70–99)
Glucose-Capillary: 146 mg/dL — ABNORMAL HIGH (ref 70–99)
Glucose-Capillary: 173 mg/dL — ABNORMAL HIGH (ref 70–99)
Glucose-Capillary: 147 mg/dL — ABNORMAL HIGH (ref 70–99)

## 2022-01-07 LAB — CBC
HCT: 36.2 % (ref 36.0–46.0)
HCT: 32.4 % — ABNORMAL LOW (ref 36.0–46.0)
HCT: 25.1 % — ABNORMAL LOW (ref 36.0–46.0)
Hemoglobin: 11.9 g/dL — ABNORMAL LOW (ref 12.0–15.0)
Hemoglobin: 10.6 g/dL — ABNORMAL LOW (ref 12.0–15.0)
Hemoglobin: 8.3 g/dL — ABNORMAL LOW (ref 12.0–15.0)
MCH: 29.2 pg (ref 26.0–34.0)
MCH: 29.2 pg (ref 26.0–34.0)
MCH: 29.3 pg (ref 26.0–34.0)
MCHC: 32.9 g/dL (ref 30.0–36.0)
MCHC: 32.7 g/dL (ref 30.0–36.0)
MCHC: 33.1 g/dL (ref 30.0–36.0)
MCV: 88.9 fL (ref 80.0–100.0)
MCV: 89.3 fL (ref 80.0–100.0)
MCV: 88.7 fL (ref 80.0–100.0)
Platelets: 184 10*3/uL (ref 150–400)
Platelets: 161 10*3/uL (ref 150–400)
Platelets: 138 10*3/uL — ABNORMAL LOW (ref 150–400)
RBC: 4.07 MIL/uL (ref 3.87–5.11)
RBC: 3.63 MIL/uL — ABNORMAL LOW (ref 3.87–5.11)
RBC: 2.83 MIL/uL — ABNORMAL LOW (ref 3.87–5.11)
RDW: 13.6 % (ref 11.5–15.5)
RDW: 13.4 % (ref 11.5–15.5)
RDW: 13.3 % (ref 11.5–15.5)
WBC: 7.5 10*3/uL (ref 4.0–10.5)
WBC: 26.2 10*3/uL — ABNORMAL HIGH (ref 4.0–10.5)
WBC: 19 10*3/uL — ABNORMAL HIGH (ref 4.0–10.5)
nRBC: 0 % (ref 0.0–0.2)
nRBC: 0 % (ref 0.0–0.2)
nRBC: 0 % (ref 0.0–0.2)

## 2022-01-07 LAB — POCT I-STAT, CHEM 8
BUN: 21 mg/dL (ref 8–23)
BUN: 19 mg/dL (ref 8–23)
BUN: 16 mg/dL (ref 8–23)
BUN: 18 mg/dL (ref 8–23)
BUN: 18 mg/dL (ref 8–23)
Calcium, Ion: 1.33 mmol/L (ref 1.15–1.40)
Calcium, Ion: 1.31 mmol/L (ref 1.15–1.40)
Calcium, Ion: 0.94 mmol/L — ABNORMAL LOW (ref 1.15–1.40)
Calcium, Ion: 0.98 mmol/L — ABNORMAL LOW (ref 1.15–1.40)
Calcium, Ion: 1.02 mmol/L — ABNORMAL LOW (ref 1.15–1.40)
Chloride: 107 mmol/L (ref 98–111)
Chloride: 106 mmol/L (ref 98–111)
Chloride: 104 mmol/L (ref 98–111)
Chloride: 104 mmol/L (ref 98–111)
Chloride: 104 mmol/L (ref 98–111)
Creatinine, Ser: 1.4 mg/dL — ABNORMAL HIGH (ref 0.44–1.00)
Creatinine, Ser: 1.3 mg/dL — ABNORMAL HIGH (ref 0.44–1.00)
Creatinine, Ser: 1 mg/dL (ref 0.44–1.00)
Creatinine, Ser: 1.1 mg/dL — ABNORMAL HIGH (ref 0.44–1.00)
Creatinine, Ser: 1 mg/dL (ref 0.44–1.00)
Glucose, Bld: 99 mg/dL (ref 70–99)
Glucose, Bld: 127 mg/dL — ABNORMAL HIGH (ref 70–99)
Glucose, Bld: 158 mg/dL — ABNORMAL HIGH (ref 70–99)
Glucose, Bld: 181 mg/dL — ABNORMAL HIGH (ref 70–99)
Glucose, Bld: 170 mg/dL — ABNORMAL HIGH (ref 70–99)
HCT: 32 % — ABNORMAL LOW (ref 36.0–46.0)
HCT: 31 % — ABNORMAL LOW (ref 36.0–46.0)
HCT: 25 % — ABNORMAL LOW (ref 36.0–46.0)
HCT: 25 % — ABNORMAL LOW (ref 36.0–46.0)
HCT: 25 % — ABNORMAL LOW (ref 36.0–46.0)
Hemoglobin: 10.9 g/dL — ABNORMAL LOW (ref 12.0–15.0)
Hemoglobin: 10.5 g/dL — ABNORMAL LOW (ref 12.0–15.0)
Hemoglobin: 8.5 g/dL — ABNORMAL LOW (ref 12.0–15.0)
Hemoglobin: 8.5 g/dL — ABNORMAL LOW (ref 12.0–15.0)
Hemoglobin: 8.5 g/dL — ABNORMAL LOW (ref 12.0–15.0)
Potassium: 4.8 mmol/L (ref 3.5–5.1)
Potassium: 5.5 mmol/L — ABNORMAL HIGH (ref 3.5–5.1)
Potassium: 6.3 mmol/L (ref 3.5–5.1)
Potassium: 7.2 mmol/L (ref 3.5–5.1)
Potassium: 5.4 mmol/L — ABNORMAL HIGH (ref 3.5–5.1)
Sodium: 139 mmol/L (ref 135–145)
Sodium: 137 mmol/L (ref 135–145)
Sodium: 132 mmol/L — ABNORMAL LOW (ref 135–145)
Sodium: 135 mmol/L (ref 135–145)
Sodium: 137 mmol/L (ref 135–145)
TCO2: 28 mmol/L (ref 22–32)
TCO2: 27 mmol/L (ref 22–32)
TCO2: 23 mmol/L (ref 22–32)
TCO2: 23 mmol/L (ref 22–32)
TCO2: 23 mmol/L (ref 22–32)

## 2022-01-07 LAB — BASIC METABOLIC PANEL
Anion gap: 7 (ref 5–15)
Anion gap: 10 (ref 5–15)
BUN: 20 mg/dL (ref 8–23)
BUN: 18 mg/dL (ref 8–23)
CO2: 23 mmol/L (ref 22–32)
CO2: 21 mmol/L — ABNORMAL LOW (ref 22–32)
Calcium: 9.3 mg/dL (ref 8.9–10.3)
Calcium: 7.6 mg/dL — ABNORMAL LOW (ref 8.9–10.3)
Chloride: 108 mmol/L (ref 98–111)
Chloride: 107 mmol/L (ref 98–111)
Creatinine, Ser: 1.49 mg/dL — ABNORMAL HIGH (ref 0.44–1.00)
Creatinine, Ser: 1.4 mg/dL — ABNORMAL HIGH (ref 0.44–1.00)
GFR, Estimated: 34 mL/min — ABNORMAL LOW (ref >60)
GFR, Estimated: 37 mL/min — ABNORMAL LOW (ref >60)
Glucose, Bld: 92 mg/dL (ref 70–99)
Glucose, Bld: 128 mg/dL — ABNORMAL HIGH (ref 70–99)
Potassium: 4.2 mmol/L (ref 3.5–5.1)
Potassium: 5.1 mmol/L (ref 3.5–5.1)
Sodium: 138 mmol/L (ref 135–145)
Sodium: 138 mmol/L (ref 135–145)

## 2022-01-07 LAB — PROTIME-INR
INR: 1.7 — ABNORMAL HIGH (ref 0.8–1.2)
Prothrombin Time: 19.3 seconds — ABNORMAL HIGH (ref 11.4–15.2)

## 2022-01-07 LAB — MULTIPLE MYELOMA PANEL, SERUM
Albumin SerPl Elph-Mcnc: 4.1 g/dL (ref 2.9–4.4)
Albumin/Glob SerPl: 1.3 (ref 0.7–1.7)
Alpha 1: 0.2 g/dL (ref 0.0–0.4)
Alpha2 Glob SerPl Elph-Mcnc: 0.9 g/dL (ref 0.4–1.0)
B-Globulin SerPl Elph-Mcnc: 1.6 g/dL — ABNORMAL HIGH (ref 0.7–1.3)
Gamma Glob SerPl Elph-Mcnc: 0.6 g/dL (ref 0.4–1.8)
Globulin, Total: 3.4 g/dL (ref 2.2–3.9)
IgA: 490 mg/dL — ABNORMAL HIGH (ref 64–422)
IgG (Immunoglobin G), Serum: 806 mg/dL (ref 586–1602)
IgM (Immunoglobulin M), Srm: 77 mg/dL (ref 26–217)
Total Protein ELP: 7.5 g/dL (ref 6.0–8.5)

## 2022-01-07 LAB — HEPARIN LEVEL (UNFRACTIONATED): Heparin Unfractionated: 0.51 IU/mL (ref 0.30–0.70)

## 2022-01-07 LAB — HEMOGLOBIN AND HEMATOCRIT, BLOOD
HCT: 22.9 % — ABNORMAL LOW (ref 36.0–46.0)
Hemoglobin: 7.7 g/dL — ABNORMAL LOW (ref 12.0–15.0)

## 2022-01-07 LAB — MAGNESIUM: Magnesium: 3.4 mg/dL — ABNORMAL HIGH (ref 1.7–2.4)

## 2022-01-07 LAB — PLATELET COUNT: Platelets: 158 10*3/uL (ref 150–400)

## 2022-01-07 LAB — APTT: aPTT: 34 seconds (ref 24–36)

## 2022-01-07 SURGERY — CORONARY ARTERY BYPASS GRAFTING (CABG)
Anesthesia: General | Site: Chest

## 2022-01-07 MED ORDER — METOPROLOL TARTRATE 5 MG/5ML IV SOLN: 2.5000 mg | INTRAVENOUS | Status: DC | PRN

## 2022-01-07 MED ORDER — HEMOSTATIC AGENTS (NO CHARGE) OPTIME
TOPICAL | Status: DC | PRN
  Administered 2022-01-07: 1 via TOPICAL

## 2022-01-07 MED ORDER — NITROGLYCERIN IN D5W 200-5 MCG/ML-% IV SOLN: 0.0000 ug/min | INTRAVENOUS | Status: DC

## 2022-01-07 MED ORDER — FENTANYL CITRATE (PF) 250 MCG/5ML IJ SOLN
INTRAMUSCULAR | Status: AC
  Filled 2022-01-07: qty 5

## 2022-01-07 MED ORDER — MORPHINE SULFATE (PF) 2 MG/ML IV SOLN
1.0000 mg | INTRAVENOUS | Status: DC | PRN
  Administered 2022-01-07 – 2022-01-08 (×2): 2 mg via INTRAVENOUS
  Filled 2022-01-07 (×2): qty 1

## 2022-01-07 MED ORDER — PLASMA-LYTE A IV SOLN
INTRAVENOUS | Status: DC | PRN
  Administered 2022-01-07: 500 mL via INTRAVASCULAR

## 2022-01-07 MED ORDER — FUROSEMIDE 10 MG/ML IJ SOLN
INTRAMUSCULAR | Status: AC
  Filled 2022-01-07: qty 4

## 2022-01-07 MED ORDER — LACTATED RINGERS IV SOLN: INTRAVENOUS | Status: DC | PRN

## 2022-01-07 MED ORDER — THROMBIN 20000 UNITS EX SOLR
CUTANEOUS | Status: DC | PRN
  Administered 2022-01-07: 20000 [IU] via TOPICAL

## 2022-01-07 MED ORDER — 0.9 % SODIUM CHLORIDE (POUR BTL) OPTIME
TOPICAL | Status: DC | PRN
  Administered 2022-01-07: 5000 mL

## 2022-01-07 MED ORDER — LACTATED RINGERS IV SOLN
INTRAVENOUS | Status: DC
  Administered 2022-01-07: 10 mL/h via INTRAVENOUS

## 2022-01-07 MED ORDER — ETOMIDATE 2 MG/ML IV SOLN
INTRAVENOUS | Status: DC | PRN
  Administered 2022-01-07: 12 mg via INTRAVENOUS

## 2022-01-07 MED ORDER — BISACODYL 10 MG RE SUPP: 10.0000 mg | Freq: Every day | RECTAL | Status: DC

## 2022-01-07 MED ORDER — ALBUMIN HUMAN 5 % IV SOLN
250.0000 mL | INTRAVENOUS | Status: AC | PRN
  Administered 2022-01-07 (×4): 12.5 g via INTRAVENOUS
  Filled 2022-01-07: qty 250

## 2022-01-07 MED ORDER — POLYVINYL ALCOHOL 1.4 % OP SOLN
1.0000 [drp] | OPHTHALMIC | Status: DC | PRN
  Filled 2022-01-07: qty 15

## 2022-01-07 MED ORDER — PROPOFOL 10 MG/ML IV BOLUS
INTRAVENOUS | Status: AC
  Filled 2022-01-07: qty 20

## 2022-01-07 MED ORDER — ASPIRIN EC 325 MG PO TBEC
325.0000 mg | DELAYED_RELEASE_TABLET | Freq: Every day | ORAL | Status: DC
  Administered 2022-01-08 – 2022-01-10 (×3): 325 mg via ORAL
  Filled 2022-01-07 (×3): qty 1

## 2022-01-07 MED ORDER — SODIUM CHLORIDE 0.9% FLUSH: 3.0000 mL | INTRAVENOUS | Status: DC | PRN

## 2022-01-07 MED ORDER — METOPROLOL TARTRATE 12.5 MG HALF TABLET: 12.5000 mg | ORAL_TABLET | Freq: Two times a day (BID) | ORAL | Status: DC

## 2022-01-07 MED ORDER — INSULIN REGULAR(HUMAN) IN NACL 100-0.9 UT/100ML-% IV SOLN
INTRAVENOUS | Status: DC
  Administered 2022-01-07: 2 [IU]/h via INTRAVENOUS

## 2022-01-07 MED ORDER — DOCUSATE SODIUM 100 MG PO CAPS
200.0000 mg | ORAL_CAPSULE | Freq: Every day | ORAL | Status: DC
  Administered 2022-01-08 – 2022-01-09 (×2): 200 mg via ORAL
  Filled 2022-01-07 (×2): qty 2

## 2022-01-07 MED ORDER — ACETAMINOPHEN 500 MG PO TABS
1000.0000 mg | ORAL_TABLET | Freq: Four times a day (QID) | ORAL | Status: AC
  Administered 2022-01-08 – 2022-01-12 (×16): 1000 mg via ORAL
  Filled 2022-01-07 (×16): qty 2

## 2022-01-07 MED ORDER — ACETAMINOPHEN 650 MG RE SUPP: 650.0000 mg | Freq: Once | RECTAL | Status: AC

## 2022-01-07 MED ORDER — DEXAMETHASONE SODIUM PHOSPHATE 10 MG/ML IJ SOLN
INTRAMUSCULAR | Status: DC | PRN
  Administered 2022-01-07: 10 mg via INTRAVENOUS

## 2022-01-07 MED ORDER — CARBOXYMETHYLCELLUL-GLYCERIN 0.5-0.9 % OP SOLN: 1.0000 [drp] | OPHTHALMIC | Status: DC

## 2022-01-07 MED ORDER — GLYCOPYRROLATE 0.2 MG/ML IJ SOLN
INTRAMUSCULAR | Status: DC | PRN
  Administered 2022-01-07: .2 mg via INTRAVENOUS

## 2022-01-07 MED ORDER — MIDAZOLAM HCL 2 MG/2ML IJ SOLN: 2.0000 mg | INTRAMUSCULAR | Status: DC | PRN

## 2022-01-07 MED ORDER — PROTAMINE SULFATE 10 MG/ML IV SOLN
INTRAVENOUS | Status: DC | PRN
  Administered 2022-01-07: 250 mg via INTRAVENOUS
  Administered 2022-01-07: 30 mg via INTRAVENOUS

## 2022-01-07 MED ORDER — FENTANYL CITRATE (PF) 250 MCG/5ML IJ SOLN
INTRAMUSCULAR | Status: AC
Start: 2022-01-07 — End: ?
  Filled 2022-01-07: qty 5

## 2022-01-07 MED ORDER — DEXTROSE 50 % IV SOLN: 0.0000 mL | INTRAVENOUS | Status: DC | PRN

## 2022-01-07 MED ORDER — THROMBIN 20000 UNITS EX SOLR
CUTANEOUS | Status: DC | PRN
  Administered 2022-01-07: 20 mL via TOPICAL

## 2022-01-07 MED ORDER — SODIUM CHLORIDE 0.9% FLUSH
3.0000 mL | Freq: Two times a day (BID) | INTRAVENOUS | Status: DC
  Administered 2022-01-08 – 2022-01-11 (×7): 3 mL via INTRAVENOUS

## 2022-01-07 MED ORDER — MIDAZOLAM HCL (PF) 10 MG/2ML IJ SOLN
INTRAMUSCULAR | Status: AC
  Filled 2022-01-07: qty 2

## 2022-01-07 MED ORDER — DEXMEDETOMIDINE HCL IN NACL 400 MCG/100ML IV SOLN
0.0000 ug/kg/h | INTRAVENOUS | Status: DC
  Administered 2022-01-07: 0.7 ug/kg/h via INTRAVENOUS
  Filled 2022-01-07: qty 100

## 2022-01-07 MED ORDER — OXYCODONE HCL 5 MG PO TABS
5.0000 mg | ORAL_TABLET | ORAL | Status: DC | PRN
  Administered 2022-01-08: 10 mg via ORAL
  Administered 2022-01-08: 5 mg via ORAL
  Filled 2022-01-07: qty 1
  Filled 2022-01-07: qty 2

## 2022-01-07 MED ORDER — PANTOPRAZOLE SODIUM 40 MG PO TBEC
40.0000 mg | DELAYED_RELEASE_TABLET | Freq: Every day | ORAL | Status: DC
  Administered 2022-01-09 – 2022-01-15 (×7): 40 mg via ORAL
  Filled 2022-01-07 (×7): qty 1

## 2022-01-07 MED ORDER — ACETAMINOPHEN 160 MG/5ML PO SOLN
1000.0000 mg | Freq: Four times a day (QID) | ORAL | Status: AC
  Filled 2022-01-07: qty 40.6

## 2022-01-07 MED ORDER — CHLORHEXIDINE GLUCONATE CLOTH 2 % EX PADS
6.0000 | MEDICATED_PAD | Freq: Every day | CUTANEOUS | Status: DC
  Administered 2022-01-07 – 2022-01-11 (×5): 6 via TOPICAL

## 2022-01-07 MED ORDER — PHENYLEPHRINE 80 MCG/ML (10ML) SYRINGE FOR IV PUSH (FOR BLOOD PRESSURE SUPPORT)
PREFILLED_SYRINGE | INTRAVENOUS | Status: DC | PRN
  Administered 2022-01-07 (×3): 80 ug via INTRAVENOUS
  Administered 2022-01-07: 40 ug via INTRAVENOUS
  Administered 2022-01-07: 80 ug via INTRAVENOUS
  Administered 2022-01-07: 40 ug via INTRAVENOUS
  Administered 2022-01-07: 80 ug via INTRAVENOUS
  Administered 2022-01-07 (×4): 40 ug via INTRAVENOUS
  Administered 2022-01-07 (×2): 80 ug via INTRAVENOUS
  Administered 2022-01-07: 40 ug via INTRAVENOUS

## 2022-01-07 MED ORDER — VANCOMYCIN HCL IN DEXTROSE 1-5 GM/200ML-% IV SOLN
1000.0000 mg | Freq: Once | INTRAVENOUS | Status: AC
  Administered 2022-01-07: 1000 mg via INTRAVENOUS
  Filled 2022-01-07: qty 200

## 2022-01-07 MED ORDER — LACTATED RINGERS IV SOLN: 500.0000 mL | Freq: Once | INTRAVENOUS | Status: DC | PRN

## 2022-01-07 MED ORDER — THROMBIN (RECOMBINANT) 20000 UNITS EX SOLR
CUTANEOUS | Status: AC
  Filled 2022-01-07: qty 20000

## 2022-01-07 MED ORDER — TRAMADOL HCL 50 MG PO TABS: 50.0000 mg | ORAL_TABLET | ORAL | Status: DC | PRN

## 2022-01-07 MED ORDER — ALBUMIN HUMAN 5 % IV SOLN: INTRAVENOUS | Status: DC | PRN

## 2022-01-07 MED ORDER — MAGNESIUM SULFATE 4 GM/100ML IV SOLN
4.0000 g | Freq: Once | INTRAVENOUS | Status: AC
  Administered 2022-01-07: 4 g via INTRAVENOUS
  Filled 2022-01-07: qty 100

## 2022-01-07 MED ORDER — FENTANYL CITRATE (PF) 250 MCG/5ML IJ SOLN
INTRAMUSCULAR | Status: DC | PRN
Start: 2022-01-07 — End: 2022-01-07
  Administered 2022-01-07: 50 ug via INTRAVENOUS
  Administered 2022-01-07: 100 ug via INTRAVENOUS
  Administered 2022-01-07: 50 ug via INTRAVENOUS
  Administered 2022-01-07: 200 ug via INTRAVENOUS
  Administered 2022-01-07: 175 ug via INTRAVENOUS
  Administered 2022-01-07: 150 ug via INTRAVENOUS
  Administered 2022-01-07 (×3): 100 ug via INTRAVENOUS
  Administered 2022-01-07: 25 ug via INTRAVENOUS

## 2022-01-07 MED ORDER — DOPAMINE-DEXTROSE 3.2-5 MG/ML-% IV SOLN
INTRAVENOUS | Status: DC | PRN
  Administered 2022-01-07: 2 ug/kg/min via INTRAVENOUS

## 2022-01-07 MED ORDER — SODIUM CHLORIDE 0.9 % IV SOLN: INTRAVENOUS | Status: DC

## 2022-01-07 MED ORDER — LACTATED RINGERS IV SOLN: INTRAVENOUS | Status: DC

## 2022-01-07 MED ORDER — BISACODYL 5 MG PO TBEC
10.0000 mg | DELAYED_RELEASE_TABLET | Freq: Every day | ORAL | Status: DC
  Administered 2022-01-08 – 2022-01-09 (×2): 10 mg via ORAL
  Filled 2022-01-07 (×2): qty 2

## 2022-01-07 MED ORDER — FUROSEMIDE 10 MG/ML IJ SOLN
INTRAMUSCULAR | Status: DC | PRN
  Administered 2022-01-07: 20 mg via INTRAMUSCULAR

## 2022-01-07 MED ORDER — ONDANSETRON HCL 4 MG/2ML IJ SOLN
4.0000 mg | Freq: Four times a day (QID) | INTRAMUSCULAR | Status: DC | PRN
  Administered 2022-01-08 (×2): 4 mg via INTRAVENOUS
  Filled 2022-01-07 (×2): qty 2

## 2022-01-07 MED ORDER — SODIUM CHLORIDE 0.9 % IV SOLN: 250.0000 mL | INTRAVENOUS | Status: DC

## 2022-01-07 MED ORDER — CEFAZOLIN SODIUM-DEXTROSE 2-4 GM/100ML-% IV SOLN
2.0000 g | Freq: Three times a day (TID) | INTRAVENOUS | Status: AC
Start: 2022-01-07 — End: 2022-01-09
  Administered 2022-01-07 – 2022-01-09 (×6): 2 g via INTRAVENOUS
  Filled 2022-01-07 (×6): qty 100

## 2022-01-07 MED ORDER — DOPAMINE-DEXTROSE 3.2-5 MG/ML-% IV SOLN
2.0000 ug/kg/min | INTRAVENOUS | Status: DC
  Administered 2022-01-07: 2 ug/kg/min via INTRAVENOUS
  Filled 2022-01-07: qty 250

## 2022-01-07 MED ORDER — HEPARIN SODIUM (PORCINE) 1000 UNIT/ML IJ SOLN
INTRAMUSCULAR | Status: DC | PRN
  Administered 2022-01-07: 28000 [IU] via INTRAVENOUS

## 2022-01-07 MED ORDER — SODIUM CHLORIDE 0.45 % IV SOLN: INTRAVENOUS | Status: DC | PRN

## 2022-01-07 MED ORDER — POTASSIUM CHLORIDE 10 MEQ/50ML IV SOLN: 10.0000 meq | INTRAVENOUS | Status: AC

## 2022-01-07 MED ORDER — ASPIRIN 81 MG PO CHEW
324.0000 mg | CHEWABLE_TABLET | Freq: Every day | ORAL | Status: DC
  Administered 2022-01-11: 324 mg
  Filled 2022-01-07: qty 4

## 2022-01-07 MED ORDER — FAMOTIDINE IN NACL 20-0.9 MG/50ML-% IV SOLN
20.0000 mg | Freq: Two times a day (BID) | INTRAVENOUS | Status: AC
  Administered 2022-01-07 (×2): 20 mg via INTRAVENOUS
  Filled 2022-01-07 (×2): qty 50

## 2022-01-07 MED ORDER — PHENYLEPHRINE HCL-NACL 20-0.9 MG/250ML-% IV SOLN
0.0000 ug/min | INTRAVENOUS | Status: DC
  Administered 2022-01-07: 30 ug/min via INTRAVENOUS
  Administered 2022-01-08: 25 ug/min via INTRAVENOUS
  Administered 2022-01-08: 40 ug/min via INTRAVENOUS
  Administered 2022-01-09: 30 ug/min via INTRAVENOUS
  Filled 2022-01-07 (×4): qty 250

## 2022-01-07 MED ORDER — CHLORHEXIDINE GLUCONATE 0.12 % MT SOLN
15.0000 mL | OROMUCOSAL | Status: AC
  Administered 2022-01-07: 15 mL via OROMUCOSAL
  Filled 2022-01-07: qty 15

## 2022-01-07 MED ORDER — MIDAZOLAM HCL (PF) 5 MG/ML IJ SOLN
INTRAMUSCULAR | Status: DC | PRN
  Administered 2022-01-07: 1 mg via INTRAVENOUS
  Administered 2022-01-07: .5 mg via INTRAVENOUS
  Administered 2022-01-07 (×2): 1 mg via INTRAVENOUS
  Administered 2022-01-07: 2.5 mg via INTRAVENOUS

## 2022-01-07 MED ORDER — LIDOCAINE 2% (20 MG/ML) 5 ML SYRINGE
INTRAMUSCULAR | Status: DC | PRN
  Administered 2022-01-07: 40 mg via INTRAVENOUS

## 2022-01-07 MED ORDER — METOPROLOL TARTRATE 25 MG/10 ML ORAL SUSPENSION: 12.5000 mg | Freq: Two times a day (BID) | ORAL | Status: DC

## 2022-01-07 MED ORDER — ROCURONIUM BROMIDE 10 MG/ML (PF) SYRINGE
PREFILLED_SYRINGE | INTRAVENOUS | Status: DC | PRN
  Administered 2022-01-07: 40 mg via INTRAVENOUS
  Administered 2022-01-07: 20 mg via INTRAVENOUS
  Administered 2022-01-07: 40 mg via INTRAVENOUS
  Administered 2022-01-07: 30 mg via INTRAVENOUS
  Administered 2022-01-07: 50 mg via INTRAVENOUS

## 2022-01-07 MED ORDER — ACETAMINOPHEN 160 MG/5ML PO SOLN
650.0000 mg | Freq: Once | ORAL | Status: AC
  Administered 2022-01-07: 650 mg

## 2022-01-07 MED ORDER — METOCLOPRAMIDE HCL 5 MG/ML IJ SOLN
10.0000 mg | Freq: Four times a day (QID) | INTRAMUSCULAR | Status: AC
  Administered 2022-01-07 – 2022-01-08 (×3): 10 mg via INTRAVENOUS
  Filled 2022-01-07 (×3): qty 2

## 2022-01-07 MED ORDER — ETOMIDATE 2 MG/ML IV SOLN
INTRAVENOUS | Status: AC
  Filled 2022-01-07: qty 10

## 2022-01-07 SURGICAL SUPPLY — 107 items
BAG DECANTER FOR FLEXI CONT (MISCELLANEOUS) ×3 IMPLANT
BLADE CLIPPER SURG (BLADE) ×3 IMPLANT
BLADE STERNUM SYSTEM 6 (BLADE) ×3 IMPLANT
BLADE SURG 11 STRL SS (BLADE) ×1 IMPLANT
BNDG ELASTIC 4X5.8 VLCR STR LF (GAUZE/BANDAGES/DRESSINGS) ×4 IMPLANT
BNDG ELASTIC 6X5.8 VLCR STR LF (GAUZE/BANDAGES/DRESSINGS) ×4 IMPLANT
BNDG GAUZE ELAST 4 BULKY (GAUZE/BANDAGES/DRESSINGS) ×4 IMPLANT
CANISTER SUCT 3000ML PPV (MISCELLANEOUS) ×3 IMPLANT
CATH ROBINSON RED A/P 18FR (CATHETERS) ×6 IMPLANT
CATH THORACIC 28FR (CATHETERS) ×3 IMPLANT
CATH THORACIC 36FR (CATHETERS) ×3 IMPLANT
CATH THORACIC 36FR RT ANG (CATHETERS) ×3 IMPLANT
CLIP TI WIDE RED SMALL 24 (CLIP) ×1 IMPLANT
CLIP VESOCCLUDE MED 24/CT (CLIP) IMPLANT
CLIP VESOCCLUDE SM WIDE 24/CT (CLIP) IMPLANT
CONTAINER PROTECT SURGISLUSH (MISCELLANEOUS) ×6 IMPLANT
DEFOGGER ANTIFOG KIT (MISCELLANEOUS) ×1 IMPLANT
DERMABOND ADVANCED (GAUZE/BANDAGES/DRESSINGS) ×1
DERMABOND ADVANCED .7 DNX12 (GAUZE/BANDAGES/DRESSINGS) IMPLANT
DRAIN CHANNEL 10M FLAT 3/4 FLT (DRAIN) ×1 IMPLANT
DRAPE CARDIOVASCULAR INCISE (DRAPES) ×1
DRAPE HALF SHEET 40X57 (DRAPES) ×1 IMPLANT
DRAPE SRG 135X102X78XABS (DRAPES) ×2 IMPLANT
DRAPE WARM FLUID 44X44 (DRAPES) ×3 IMPLANT
DRSG COVADERM 4X14 (GAUZE/BANDAGES/DRESSINGS) ×3 IMPLANT
ELECT CAUTERY BLADE 6.4 (BLADE) ×3 IMPLANT
ELECT REM PT RETURN 9FT ADLT (ELECTROSURGICAL) ×6
ELECTRODE REM PT RTRN 9FT ADLT (ELECTROSURGICAL) ×4 IMPLANT
EVACUATOR SILICONE 100CC (DRAIN) ×2 IMPLANT
FELT TEFLON 1X6 (MISCELLANEOUS) ×6 IMPLANT
GAUZE 4X4 16PLY ~~LOC~~+RFID DBL (SPONGE) ×3 IMPLANT
GAUZE SPONGE 4X4 12PLY STRL (GAUZE/BANDAGES/DRESSINGS) ×6 IMPLANT
GAUZE SPONGE 4X4 12PLY STRL LF (GAUZE/BANDAGES/DRESSINGS) ×3 IMPLANT
GLOVE BIO SURGEON STRL SZ 6 (GLOVE) IMPLANT
GLOVE BIO SURGEON STRL SZ 6.5 (GLOVE) IMPLANT
GLOVE BIO SURGEON STRL SZ7 (GLOVE) IMPLANT
GLOVE BIO SURGEON STRL SZ7.5 (GLOVE) IMPLANT
GLOVE BIOGEL PI IND STRL 6 (GLOVE) IMPLANT
GLOVE BIOGEL PI IND STRL 6.5 (GLOVE) IMPLANT
GLOVE BIOGEL PI IND STRL 7.0 (GLOVE) IMPLANT
GLOVE BIOGEL PI INDICATOR 6 (GLOVE)
GLOVE BIOGEL PI INDICATOR 6.5 (GLOVE)
GLOVE BIOGEL PI INDICATOR 7.0 (GLOVE)
GLOVE ORTHO TXT STRL SZ7.5 (GLOVE) IMPLANT
GLOVE SURG MICRO LTX SZ7 (GLOVE) ×6 IMPLANT
GOWN STRL REUS W/ TWL LRG LVL3 (GOWN DISPOSABLE) ×8 IMPLANT
GOWN STRL REUS W/ TWL XL LVL3 (GOWN DISPOSABLE) ×2 IMPLANT
GOWN STRL REUS W/TWL LRG LVL3 (GOWN DISPOSABLE) ×4
GOWN STRL REUS W/TWL XL LVL3 (GOWN DISPOSABLE) ×1
HEMOSTAT POWDER SURGIFOAM 1G (HEMOSTASIS) ×9 IMPLANT
HEMOSTAT SURGICEL 2X14 (HEMOSTASIS) ×3 IMPLANT
INSERT FOGARTY 61MM (MISCELLANEOUS) IMPLANT
INSERT FOGARTY XLG (MISCELLANEOUS) IMPLANT
KIT BASIN OR (CUSTOM PROCEDURE TRAY) ×3 IMPLANT
KIT CATH CPB BARTLE (MISCELLANEOUS) ×3 IMPLANT
KIT SUCTION CATH 14FR (SUCTIONS) ×3 IMPLANT
KIT TURNOVER KIT B (KITS) ×3 IMPLANT
KIT VASOVIEW HEMOPRO 2 VH 4000 (KITS) ×3 IMPLANT
NS IRRIG 1000ML POUR BTL (IV SOLUTION) ×15 IMPLANT
PACK E OPEN HEART (SUTURE) ×3 IMPLANT
PACK OPEN HEART (CUSTOM PROCEDURE TRAY) ×3 IMPLANT
PAD ARMBOARD 7.5X6 YLW CONV (MISCELLANEOUS) ×6 IMPLANT
PAD ELECT DEFIB RADIOL ZOLL (MISCELLANEOUS) ×3 IMPLANT
PENCIL BUTTON HOLSTER BLD 10FT (ELECTRODE) ×3 IMPLANT
POSITIONER HEAD DONUT 9IN (MISCELLANEOUS) ×3 IMPLANT
PUNCH AORTIC ROTATE 4.0MM (MISCELLANEOUS) IMPLANT
PUNCH AORTIC ROTATE 4.5MM 8IN (MISCELLANEOUS) ×3 IMPLANT
PUNCH AORTIC ROTATE 5MM 8IN (MISCELLANEOUS) IMPLANT
SET MPS 3-ND DEL (MISCELLANEOUS) ×1 IMPLANT
SPONGE INTESTINAL PEANUT (DISPOSABLE) IMPLANT
SPONGE T-LAP 18X18 ~~LOC~~+RFID (SPONGE) ×12 IMPLANT
SPONGE T-LAP 4X18 ~~LOC~~+RFID (SPONGE) ×6 IMPLANT
SUPPORT HEART JANKE-BARRON (MISCELLANEOUS) ×3 IMPLANT
SUT BONE WAX W31G (SUTURE) ×3 IMPLANT
SUT ETHILON 3 0 FSL (SUTURE) ×2 IMPLANT
SUT MNCRL AB 4-0 PS2 18 (SUTURE) ×2 IMPLANT
SUT PROLENE 3 0 SH DA (SUTURE) IMPLANT
SUT PROLENE 3 0 SH1 36 (SUTURE) ×3 IMPLANT
SUT PROLENE 4 0 RB 1 (SUTURE)
SUT PROLENE 4 0 SH DA (SUTURE) IMPLANT
SUT PROLENE 4-0 RB1 .5 CRCL 36 (SUTURE) IMPLANT
SUT PROLENE 5 0 C 1 36 (SUTURE) IMPLANT
SUT PROLENE 6 0 C 1 30 (SUTURE) IMPLANT
SUT PROLENE 7 0 BV 1 (SUTURE) IMPLANT
SUT PROLENE 7 0 BV1 MDA (SUTURE) ×4 IMPLANT
SUT PROLENE 8 0 BV175 6 (SUTURE) IMPLANT
SUT SILK  1 MH (SUTURE)
SUT SILK 1 MH (SUTURE) IMPLANT
SUT SILK 2 0 SH (SUTURE) IMPLANT
SUT STEEL STERNAL CCS#1 18IN (SUTURE) IMPLANT
SUT STEEL SZ 6 DBL 3X14 BALL (SUTURE) IMPLANT
SUT VIC AB 1 CTX 36 (SUTURE) ×2
SUT VIC AB 1 CTX36XBRD ANBCTR (SUTURE) ×4 IMPLANT
SUT VIC AB 2-0 CT1 27 (SUTURE) ×2
SUT VIC AB 2-0 CT1 TAPERPNT 27 (SUTURE) IMPLANT
SUT VIC AB 2-0 CTX 27 (SUTURE) IMPLANT
SUT VIC AB 3-0 SH 27 (SUTURE)
SUT VIC AB 3-0 SH 27X BRD (SUTURE) IMPLANT
SUT VIC AB 3-0 X1 27 (SUTURE) IMPLANT
SUT VICRYL 4-0 PS2 18IN ABS (SUTURE) IMPLANT
SYSTEM SAHARA CHEST DRAIN ATS (WOUND CARE) ×3 IMPLANT
TOWEL GREEN STERILE (TOWEL DISPOSABLE) ×3 IMPLANT
TOWEL GREEN STERILE FF (TOWEL DISPOSABLE) ×3 IMPLANT
TRAY FOLEY SLVR 16FR TEMP STAT (SET/KITS/TRAYS/PACK) ×3 IMPLANT
TUBING LAP HI FLOW INSUFFLATIO (TUBING) ×3 IMPLANT
UNDERPAD 30X36 HEAVY ABSORB (UNDERPADS AND DIAPERS) ×3 IMPLANT
WATER STERILE IRR 1000ML POUR (IV SOLUTION) ×6 IMPLANT

## 2022-01-07 NOTE — Progress Notes (Addendum)
Report called to [facility] in Anesthesia. VS stable, SR on tele. No CP overnight. Awaiting transport. Jessie Foot, RN  ?All patient belongings taken last night by patients daughter.Jessie Foot, RN  ?

## 2022-01-07 NOTE — Anesthesia Procedure Notes (Signed)
Arterial Line Insertion ?Start/End5/09/2021 6:50 AM, 01/07/2022 7:00 AM ?Performed by: Belinda Block, MD, Vonna Drafts, CRNA, CRNA ? Patient location: Pre-op. ?Preanesthetic checklist: patient identified, IV checked, site marked, risks and benefits discussed, surgical consent, monitors and equipment checked, pre-op evaluation, timeout performed and anesthesia consent ?Lidocaine 1% used for infiltration and patient sedated ?Left, radial was placed ?Catheter size: 20 G ?Hand hygiene performed  and maximum sterile barriers used  ? ?Attempts: 1 ?Procedure performed without using ultrasound guided technique. ?Following insertion, dressing applied and Biopatch. ?Post procedure assessment: normal ? ?Patient tolerated the procedure well with no immediate complications. ? ? ?

## 2022-01-07 NOTE — Anesthesia Procedure Notes (Addendum)
Central Venous Catheter Insertion ?Performed by: Belinda Block, MD, anesthesiologist ?Start/End5/09/2021 7:05 AM, 01/07/2022 7:20 AM ?Patient location: Pre-op. ?Preanesthetic checklist: patient identified, IV checked, site marked, risks and benefits discussed, surgical consent, monitors and equipment checked, pre-op evaluation, timeout performed and anesthesia consent ?Lidocaine 1% used for infiltration and patient sedated ?Hand hygiene performed  and maximum sterile barriers used  ?Catheter size: 8.5 Fr ?Sheath introducer ?Procedure performed using ultrasound guided technique. ?Ultrasound Notes:anatomy identified, needle tip was noted to be adjacent to the nerve/plexus identified, no ultrasound evidence of intravascular and/or intraneural injection and image(s) printed for medical record ?Attempts: 1 ?Following insertion, line sutured and dressing applied. ?Post procedure assessment: blood return through all ports, free fluid flow and no air ? ?Patient tolerated the procedure well with no immediate complications. ? ? ? ?

## 2022-01-07 NOTE — Progress Notes (Signed)
Rapid wean initiated per protocol  

## 2022-01-07 NOTE — Anesthesia Procedure Notes (Addendum)
Central Venous Catheter Insertion ?Performed by: Belinda Block, MD, anesthesiologist ?Start/End5/09/2021 7:05 AM, 01/07/2022 7:20 AM ?Patient location: Pre-op. ?Preanesthetic checklist: patient identified, IV checked, site marked, risks and benefits discussed, surgical consent, monitors and equipment checked, pre-op evaluation, timeout performed and anesthesia consent ?Hand hygiene performed  and maximum sterile barriers used  ?PA cath was placed.Swan type:thermodilution ?Procedure performed without using ultrasound guided technique. ?Attempts: 1 ?Patient tolerated the procedure well with no immediate complications. ? ? ? ? ?

## 2022-01-07 NOTE — Progress Notes (Signed)
?  Echocardiogram ?Echocardiogram Transesophageal has been performed. ? ?Katherine Brooks ?01/07/2022, 8:25 AM ?

## 2022-01-07 NOTE — Procedures (Addendum)
Extubation Procedure Note ? ?Patient Details:   ?Name: Katherine Brooks ?DOB: Mar 08, 1936 ?MRN: [id number] ? ? ?Evaluation ? O2 sats: stable throughout ?Complications: No apparent complications ?Patient did tolerate procedure well. ?Bilateral Breath Sounds: Clear, Diminished ?  ?Yes ? ?Pt extubated to 4L [place] per protocol.  RT did perimeters with pt ? NIF: -25 VC: 1L. Positive cuff leak, no stridor noted,pt able to say her name. Pt tolerated well. ?IS done with pt  ? ? ?Clance Boll ?01/07/2022, 2127 ? ?

## 2022-01-07 NOTE — Brief Op Note (Signed)
01/01/2022 - 01/07/2022 ? ?8:55 AM ? ?PATIENT:  Katherine Brooks  86 y.o. female ? ?PRE-OPERATIVE DIAGNOSIS:  CORONARY ARTERY DISEASE ? ?POST-OPERATIVE DIAGNOSIS:  CORONARY ARTERY DISEASE ? ?PROCEDURE:  Procedure(s): ? ?CORONARY ARTERY BYPASS GRAFTING x 4 ?-LIMA to LAD ?-SVG to OM ?-SVG to DIAG ?-SVG to PDA ? ?TRANSESOPHAGEAL ECHOCARDIOGRAM (TEE) (N/A) ? ?ENDOSCOPIC HARVEST GREATER SAPHENOUS VEIN ?- Right and Left Thigh ? ?Vein harvest time: 120 min Vein prep time: 40 min ? ?SURGEON:  Surgeon(s) and Role: ?   * Bartle, Fernande Boyden, MD - Primary ? ?PHYSICIAN ASSISTANT: Ellwood Handler PA-C ? ?ASSISTANTS: Despina Arias RNFA  ? ?ANESTHESIA:   general ? ?EBL:  763 mL  ? ?BLOOD ADMINISTERED:   CC CELLSAVER ? ?DRAINS:  Left Pleural Chest Tube, Mediastinal Chest Drains, JP Drain x 2 Right and Left Leg   ? ?LOCAL MEDICATIONS USED:  NONE ? ?SPECIMEN:  No Specimen ? ?DISPOSITION OF SPECIMEN:  N/A ? ?COUNTS:  YES ? ?TOURNIQUET:  * No tourniquets in log * ? ?DICTATION: .Dragon Dictation ? ?PLAN OF CARE: Admit to inpatient  ? ?PATIENT DISPOSITION:  ICU - intubated and hemodynamically stable. ?  ?Delay start of Pharmacological VTE agent (>24hrs) due to surgical blood loss or risk of bleeding: yes ? ?

## 2022-01-07 NOTE — Op Note (Signed)
?CARDIOVASCULAR SURGERY OPERATIVE NOTE ? ?01/07/2022 ? ?Surgeon:  Gaye Pollack, MD ? ?First Assistant: Ellwood Handler,  PA-C:   An experienced assistant was required given the complexity of this surgery and the standard of surgical care. The assistant was needed for endoscopic vein harvesting, exposure, dissection, suctioning, retraction of delicate tissues and sutures, instrument exchange and for overall help during this procedure. ? ? ?Preoperative Diagnosis:  Severe left main and multi-vessel coronary artery disease ? ? ?Postoperative Diagnosis:  Same ? ? ?Procedure: ? ?Median Sternotomy ?Extracorporeal circulation ?3.   Coronary artery bypass grafting x 4 ? ?Left internal mammary artery graft to the LAD ?SVG to diagonal ?SVG to OM ?SVG to PDA ?4.   Endoscopic vein harvest from both legs ? ? ?Anesthesia:  General Endotracheal ? ? ?Clinical History/Surgical Indication: ? ?This 86 year old woman has a calcified high grade left main stenosis as well as tight calcified proximal LAD and RCA stenoses with graftable distal vessels. Her EF is 30-35% by echo in March but a nuclear stress test last week showed an EF of 65% with a large area of ischemia anteriorly. CT of the chest today shows no significant lung disease and her aorta has some calcification in root and arch but the ascending looks ok. I think CABG is the best option for her. She has stage 3 CKD with creat of 1.7 dating back to 08/2020 and prior to that it was 1.3-1.4. I discussed the operative procedure with the patient and her two daughters including alternatives, benefits and risks; including but not limited to bleeding, blood transfusion, infection, stroke, myocardial infarction, graft failure, heart block requiring a permanent pacemaker, organ dysfunction, and death.  Katherine Brooks understands and agrees to proceed.  ? ?Preparation: ? ?The patient was seen in the  preoperative holding area and the correct patient, correct operation were confirmed with the patient after reviewing the medical record and catheterization. The consent was signed by me. Preoperative antibiotics were given. A pulmonary arterial line and radial arterial line were placed by the anesthesia team. The patient was taken back to the operating room and positioned supine on the operating room table. After being placed under general endotracheal anesthesia by the anesthesia team a foley catheter was placed. The neck, chest, abdomen, and both legs were prepped with betadine soap and solution and draped in the usual sterile manner. A surgical time-out was taken and the correct patient and operative procedure were confirmed with the nursing and anesthesia staff. ? ? ?Cardiopulmonary Bypass: ? ?A median sternotomy was performed. The pericardium was opened in the midline. Right ventricular function appeared normal. The ascending aorta was of normal size and had no palpable plaque. There were no contraindications to aortic cannulation or cross-clamping. The patient was fully systemically heparinized and the ACT was maintained > 400 sec. The proximal aortic arch was cannulated with a 20 F aortic cannula for arterial inflow. Venous cannulation was performed via the right atrial appendage using a two-staged venous cannula. An antegrade cardioplegia/vent cannula was inserted into the mid-ascending aorta. Aortic occlusion was performed with a single cross-clamp. Systemic cooling to 32 degrees Centigrade and topical cooling of the heart with iced saline were used. Hyperkalemic antegrade cold blood cardioplegia was used to induce diastolic arrest and was then given at about 20 minute intervals throughout the period of arrest to maintain myocardial temperature at or below 10 degrees centigrade. A temperature probe was inserted into the interventricular septum and an insulating pad was placed in  the pericardium. ? ? ?Left  internal mammary artery harvest: ? ?The left side of the sternum was retracted using the Rultract retractor. The left internal mammary artery was harvested as a pedicle graft. All side branches were clipped. It was a medium-sized vessel of good quality with excellent blood flow. It was ligated distally and divided. It was sprayed with topical papaverine solution to prevent vasospasm. ? ? ?Endoscopic vein harvest: ? ?The right greater saphenous vein was harvested endoscopically through a 2 cm incision medial to the right knee. It was harvested from the upper thigh to below the knee. It was a medium-sized vein of good quality in the thigh but bifurcated into small branches below the knee. Therefore a second segment of GSV was harvested from the left thigh endoscopically. This was good quality vein of medium caliber. Both vein harvests were difficult due to her obese legs. The side branches were all ligated with 4-0 silk ties.  ? ? ?Coronary arteries: ? ?The coronary arteries were examined. ? ?LAD:  medium caliber vessel with no distal disease. The diagonal was also medium caliber with segmental distal disease. ?LCX:  OM was a large vessel with no distal disease. ?RCA:  The RCA was diffusely diseased but the PDA branch had no disease and was a medium caliber vessel. ? ? ?Grafts: ? ?LIMA to the LAD: 2.0 mm. It was sewn end to side using 8-0 prolene continuous suture. ?SVG to diagonal:  1.6 mm. It was sewn end to side using 7-0 prolene continuous suture. ?SVG to OM:  1.75 mm. It was sewn end to side using 7-0 prolene continuous suture. ?SVG to PDA:  1.6 mm. It was sewn end to side using 7-0 prolene continuous suture. ? ?The proximal vein graft anastomoses were performed to the mid-ascending aorta using continuous 6-0 prolene suture. Graft markers were placed around the proximal anastomoses. ? ? ?Completion: ? ?The patient was rewarmed to 37 degrees Centigrade. The clamp was removed from the LIMA pedicle and there was  rapid warming of the septum and return of ventricular fibrillation. The crossclamp was removed with a time of 83 minutes. There was spontaneous return of sinus rhythm. The distal and proximal anastomoses were checked for hemostasis. The position of the grafts was satisfactory. Two temporary epicardial pacing wires were placed on the right atrium and two on the right ventricle. The patient was weaned from CPB without difficulty on renal dopamine at 2 mcg that was started at the beginning of the pump run.  CPB time was 97 minutes. Cardiac output was 5 LPM. Heparin was fully reversed with protamine and the aortic and venous cannulas removed. Hemostasis was achieved. Mediastinal and left pleural drainage tubes were placed. The sternum was closed with double #6 stainless steel wires. The fascia was closed with continuous # 1 vicryl suture. The subcutaneous tissue was closed with 2-0 vicryl continuous suture. The skin was closed with 3-0 vicryl subcuticular suture. All sponge, needle, and instrument counts were reported correct at the end of the case. Dry sterile dressings were placed over the incisions and around the chest tubes which were connected to pleurevac suction. The patient was then transported to the surgical intensive care unit in stable condition. ? ?  ?

## 2022-01-07 NOTE — Hospital Course (Addendum)
History of Present Illness: ? ? We are asked to see this 86 year old female in cardiothoracic surgical consultation for consideration of coronary artery surgical revascularization.  The patient has multiple cardiac risk factors including hypertension, left bundle branch block, and recent finding of ischemic cardiomyopathy with ejection fraction 30 to 35%.  Other significant medical comorbidities include stage III chronic kidney disease.  Her most recent BUN and creatinine today are 31/1.71.  This appears to be around her recent baseline.  Patient developed persistent dyspnea on exertion following a COVID infection earlier this year.  Echocardiogram revealed LV dysfunction with decreased EF as described above.  She was started on Coreg and Entresto and subsequently underwent stress testing on 12/28/2021 which showed a large region of mid and distal anterior and anteroseptal ischemia.  She was felt to be appropriate to proceed with diagnostic cardiac catheterization which was done and showed severe multivessel coronary artery disease including 90% distal left main/ostial LAD stenosis along with proximal 90% disease in the left circumflex and RCA.  Chest x-ray reveals significant calcification in the aorta as well as findings consistent with possible interstitial lung disease.  She is notably active and functional.  We are being asked to consider for multivessel CABG.  She was evaluated by Dr. Cyndia Bent who was in agreement the patient would be a reasonable candidate for surgery.  The risks and benefits of the procedure were explained to the patient and she was agreeable to proceed. ? ?Hospital Course: ?  ?Anahla Bevis was taken to the operating room on 01/07/2022.  She underwent CABG x 4 utilizing LIMA to LAD, SVG to OM, SVG to Diagonal, and SVG to PDA.  She underwent Endoscopic harvest of greater saphenous vein from her right and left thigh.  She did require placement of bilateral JP drains.  She was also placed renal dose  dopamine for her baseline Stg 3 CKD.  She tolerated the procedure without difficulty and was taken to the SICU in stable condition.  The patient was extubated the evening of surgery.  She was hypotensive and orthostatic requiring Neo Synephrine for additional support.  This will be weaned as hemodynamics allowed.  She was also on renal dose Dopamine due to her underlying Stage 3 CKD.  Her creatinine remained stable post operatively.  The patient had post operative blood loss anemia.  She was transfused unit of PRBCs with Hgb of 7.7.  Iron therapy was also initiated.  Her chest tubes, arterial lines, and swan ganz catheter was removed without difficulty.  The patient continued to have issues with hypotension.  She was started on Midodrine to help facilitate wean of Neo-synephrine, this was difficult as patient had issues with low pressures.  She developed leukocytosis that was not due to a clear source.  Urinalysis and culture was obtained.  This showed no growth.  Her JP drains were removed from her bilateral LE extremities.  She was evaluated by PT/OT who recommended home services, however the patient declined. Sleeve was removed on 05/05. She was surgically stable for transfer from the ICU to 4E on 01/11/2022.   The patient remains in NSR with occasional PVC.  Her blood pressure improved and Midodrine was decreased to 5 mg TID.  She is ambulating without difficulty.  Her surgical incisions are healing without evidence of infection.  He is medically stable for discharge home today. ?

## 2022-01-07 NOTE — Anesthesia Procedure Notes (Signed)
Procedure Name: Intubation ?Date/Time: 01/07/2022 7:57 AM ?Performed by: Vonna Drafts, CRNA ?Pre-anesthesia Checklist: Patient identified, Emergency Drugs available, Suction available and Patient being monitored ?Patient Re-evaluated:Patient Re-evaluated prior to induction ?Oxygen Delivery Method: Circle system utilized ?Preoxygenation: Pre-oxygenation with 100% oxygen ?Induction Type: IV induction ?Ventilation: Mask ventilation without difficulty ?Laryngoscope Size: Mac and 3 ?Grade View: Grade I ?Tube type: Oral ?Tube size: 8.0 mm ?Number of attempts: 1 ?Airway Equipment and Method: Stylet ?Placement Confirmation: ETT inserted through vocal cords under direct vision, positive ETCO2 and breath sounds checked- equal and bilateral ?Secured at: 22 cm ?Tube secured with: Tape ?Dental Injury: Teeth and Oropharynx as per pre-operative assessment  ? ? ? ? ?

## 2022-01-07 NOTE — Anesthesia Postprocedure Evaluation (Signed)
Anesthesia Post Note ? ?Patient: Katherine Brooks ? ?Procedure(s) Performed: CORONARY ARTERY BYPASS GRAFTING (CABG) X FOUR BYPASSES USING OPEN LEFT INTERNAL MAMMARY ARTERY AND  ENDOSCOPIC RIGHT AND LEFT GREATER SAPHENOUS VEIN HARVEST. (Chest) ?TRANSESOPHAGEAL ECHOCARDIOGRAM (TEE) ? ?  ? ?Patient location during evaluation: SICU ?Anesthesia Type: General ?Level of consciousness: patient remains intubated per anesthesia plan ?Pain management: pain level controlled ?Vital Signs Assessment: post-procedure vital signs reviewed and stable ?Respiratory status: patient remains intubated per anesthesia plan ?Cardiovascular status: stable ?Postop Assessment: no apparent nausea or vomiting ?Anesthetic complications: no ? ? ?No notable events documented. ? ?Last Vitals:  ?Vitals:  ? 01/07/22 1500 01/07/22 1515  ?BP:    ?Pulse: 84 84  ?Resp: 12 12  ?Temp: (!) 35.3 ?C (!) 35.4 ?C  ?SpO2: 100% 99%  ?  ?Last Pain:  ?Vitals:  ? 01/07/22 0507  ?TempSrc: Oral  ?PainSc:   ? ? ?  ?  ?  ?  ?  ?  ? ?Katherine Brooks ? ? ? ? ?

## 2022-01-07 NOTE — Progress Notes (Signed)
Patient ID: Katherine Brooks, female   DOB: 20-Sep-1935, 86 y.o.   MRN: [id number] ? ? ?TCTS Evening Rounds:  ? ?Hemodynamically stable  ?CI = 2 ? ?Still asleep on vent.  ?Urine output good  ?CT output low ? ?CBC ?   ?Component Value Date/Time  ? WBC 26.2 (H) 01/07/2022 1357  ? RBC 3.63 (L) 01/07/2022 1357  ? HGB 10.5 (L) 01/07/2022 1413  ? HCT 31.0 (L) 01/07/2022 1413  ? PLT 161 01/07/2022 1357  ? MCV 89.3 01/07/2022 1357  ? MCV 82.0 12/21/2014 0953  ? MCH 29.2 01/07/2022 1357  ? MCHC 32.7 01/07/2022 1357  ? RDW 13.4 01/07/2022 1357  ? LYMPHSABS 2.4 12/31/2021 1004  ? MONOABS 0.6 12/31/2021 1004  ? EOSABS 0.3 12/31/2021 1004  ? BASOSABS 0.1 12/31/2021 1004  ? ? ? ?BMET ?   ?Component Value Date/Time  ? NA 137 01/07/2022 1413  ? NA 138 11/13/2021 1015  ? K 4.3 01/07/2022 1413  ? CL 104 01/07/2022 1246  ? CO2 23 01/07/2022 0530  ? GLUCOSE 170 (H) 01/07/2022 1246  ? BUN 18 01/07/2022 1246  ? BUN 19 11/13/2021 1015  ? CREATININE 1.00 01/07/2022 1246  ? CREATININE 1.29 (H) 03/01/2016 8685  ? CALCIUM 9.3 01/07/2022 0530  ? EGFR 29 (L) 11/13/2021 1015  ? GFRNONAA 34 (L) 01/07/2022 0530  ? GFRNONAA 40 (L) 03/01/2016 4883  ? ? ? ?A/P:  Stable postop course. Continue current plans ? ?

## 2022-01-07 NOTE — Transfer of Care (Signed)
Immediate Anesthesia Transfer of Care Note ? ?Patient: Katherine Brooks ? ?Procedure(s) Performed: CORONARY ARTERY BYPASS GRAFTING (CABG) X FOUR BYPASSES USING OPEN LEFT INTERNAL MAMMARY ARTERY AND  ENDOSCOPIC RIGHT AND LEFT GREATER SAPHENOUS VEIN HARVEST. (Chest) ?TRANSESOPHAGEAL ECHOCARDIOGRAM (TEE) ? ?Patient Location: SICU ? ?Anesthesia Type:General ? ?Level of Consciousness: Patient remains intubated per anesthesia plan ? ?Airway & Oxygen Therapy: Patient remains intubated per anesthesia plan and Patient placed on Ventilator (see vital sign flow sheet for setting) ? ?Post-op Assessment: Report given to RN and Post -op Vital signs reviewed and stable ? ?Post vital signs: Reviewed and stable ? ?Last Vitals:  ?Vitals Value Taken Time  ?BP 118/53 01/07/22 1353  ?Temp 35.8 ?C 01/07/22 1400  ?Pulse 80 01/07/22 1400  ?Resp 14 01/07/22 1400  ?SpO2 99 % 01/07/22 1400  ?Vitals shown include unvalidated device data. ? ?Last Pain:  ?Vitals:  ? 01/07/22 0507  ?TempSrc: Oral  ?PainSc:   ?   ? ?Patients Stated Pain Goal: 0 (01/03/22 2000) ? ?Complications: No notable events documented. ?

## 2022-01-07 NOTE — Interval H&P Note (Signed)
History and Physical Interval Note: ? ?01/07/2022 ?7:00 AM ? ?Katherine Brooks  has presented today for surgery, with the diagnosis of CAD.  The various methods of treatment have been discussed with the patient and family. After consideration of risks, benefits and other options for treatment, the patient has consented to  Procedure(s): ?CORONARY ARTERY BYPASS GRAFTING (CABG) (N/A) ?TRANSESOPHAGEAL ECHOCARDIOGRAM (TEE) (N/A) as a surgical intervention.  The patient's history has been reviewed, patient examined, no change in status, stable for surgery.  I have reviewed the patient's chart and labs.  Questions were answered to the patient's satisfaction.   ? ? ?Gaye Pollack ? ? ?

## 2022-01-08 ENCOUNTER — Inpatient Hospital Stay (HOSPITAL_COMMUNITY): Payer: Medicare PPO

## 2022-01-08 ENCOUNTER — Encounter (HOSPITAL_COMMUNITY): Payer: Self-pay | Admitting: Surgery

## 2022-01-08 DIAGNOSIS — Z951 Presence of aortocoronary bypass graft: Secondary | ICD-10-CM

## 2022-01-08 LAB — GLUCOSE, CAPILLARY
Glucose-Capillary: 104 mg/dL — ABNORMAL HIGH (ref 70–99)
Glucose-Capillary: 108 mg/dL — ABNORMAL HIGH (ref 70–99)
Glucose-Capillary: 114 mg/dL — ABNORMAL HIGH (ref 70–99)
Glucose-Capillary: 120 mg/dL — ABNORMAL HIGH (ref 70–99)
Glucose-Capillary: 122 mg/dL — ABNORMAL HIGH (ref 70–99)
Glucose-Capillary: 123 mg/dL — ABNORMAL HIGH (ref 70–99)
Glucose-Capillary: 123 mg/dL — ABNORMAL HIGH (ref 70–99)
Glucose-Capillary: 125 mg/dL — ABNORMAL HIGH (ref 70–99)
Glucose-Capillary: 129 mg/dL — ABNORMAL HIGH (ref 70–99)
Glucose-Capillary: 89 mg/dL (ref 70–99)

## 2022-01-08 LAB — BASIC METABOLIC PANEL
Anion gap: 9 (ref 5–15)
Anion gap: 10 (ref 5–15)
BUN: 19 mg/dL (ref 8–23)
BUN: 20 mg/dL (ref 8–23)
CO2: 20 mmol/L — ABNORMAL LOW (ref 22–32)
CO2: 21 mmol/L — ABNORMAL LOW (ref 22–32)
Calcium: 7.8 mg/dL — ABNORMAL LOW (ref 8.9–10.3)
Calcium: 8 mg/dL — ABNORMAL LOW (ref 8.9–10.3)
Chloride: 106 mmol/L (ref 98–111)
Chloride: 106 mmol/L (ref 98–111)
Creatinine, Ser: 1.47 mg/dL — ABNORMAL HIGH (ref 0.44–1.00)
Creatinine, Ser: 1.44 mg/dL — ABNORMAL HIGH (ref 0.44–1.00)
GFR, Estimated: 35 mL/min — ABNORMAL LOW (ref >60)
GFR, Estimated: 36 mL/min — ABNORMAL LOW (ref >60)
Glucose, Bld: 113 mg/dL — ABNORMAL HIGH (ref 70–99)
Glucose, Bld: 117 mg/dL — ABNORMAL HIGH (ref 70–99)
Potassium: 4.6 mmol/L (ref 3.5–5.1)
Potassium: 4.4 mmol/L (ref 3.5–5.1)
Sodium: 135 mmol/L (ref 135–145)
Sodium: 137 mmol/L (ref 135–145)

## 2022-01-08 LAB — CBC
HCT: 23.3 % — ABNORMAL LOW (ref 36.0–46.0)
HCT: 27.2 % — ABNORMAL LOW (ref 36.0–46.0)
Hemoglobin: 7.7 g/dL — ABNORMAL LOW (ref 12.0–15.0)
Hemoglobin: 8.8 g/dL — ABNORMAL LOW (ref 12.0–15.0)
MCH: 29.2 pg (ref 26.0–34.0)
MCH: 28.7 pg (ref 26.0–34.0)
MCHC: 33 g/dL (ref 30.0–36.0)
MCHC: 32.4 g/dL (ref 30.0–36.0)
MCV: 88.3 fL (ref 80.0–100.0)
MCV: 88.6 fL (ref 80.0–100.0)
Platelets: 144 10*3/uL — ABNORMAL LOW (ref 150–400)
Platelets: 134 10*3/uL — ABNORMAL LOW (ref 150–400)
RBC: 2.64 MIL/uL — ABNORMAL LOW (ref 3.87–5.11)
RBC: 3.07 MIL/uL — ABNORMAL LOW (ref 3.87–5.11)
RDW: 13.6 % (ref 11.5–15.5)
RDW: 14.6 % (ref 11.5–15.5)
WBC: 18.8 10*3/uL — ABNORMAL HIGH (ref 4.0–10.5)
WBC: 17.3 10*3/uL — ABNORMAL HIGH (ref 4.0–10.5)
nRBC: 0 % (ref 0.0–0.2)
nRBC: 0 % (ref 0.0–0.2)

## 2022-01-08 LAB — MAGNESIUM
Magnesium: 2.9 mg/dL — ABNORMAL HIGH (ref 1.7–2.4)
Magnesium: 2.7 mg/dL — ABNORMAL HIGH (ref 1.7–2.4)

## 2022-01-08 LAB — PREPARE RBC (CROSSMATCH)

## 2022-01-08 MED ORDER — ENOXAPARIN SODIUM 40 MG/0.4ML IJ SOSY
40.0000 mg | PREFILLED_SYRINGE | Freq: Every day | INTRAMUSCULAR | Status: DC
  Administered 2022-01-08: 40 mg via SUBCUTANEOUS
  Filled 2022-01-08: qty 0.4

## 2022-01-08 MED ORDER — INSULIN DETEMIR 100 UNIT/ML ~~LOC~~ SOLN: 15.0000 [IU] | Freq: Every day | SUBCUTANEOUS | Status: DC

## 2022-01-08 MED ORDER — MIDODRINE HCL 5 MG PO TABS
10.0000 mg | ORAL_TABLET | Freq: Three times a day (TID) | ORAL | Status: DC
  Administered 2022-01-08 – 2022-01-13 (×16): 10 mg via ORAL
  Filled 2022-01-08 (×16): qty 2

## 2022-01-08 MED ORDER — FE FUMARATE-B12-VIT C-FA-IFC PO CAPS
1.0000 | ORAL_CAPSULE | Freq: Two times a day (BID) | ORAL | Status: DC
  Administered 2022-01-08 – 2022-01-15 (×14): 1 via ORAL
  Filled 2022-01-08 (×15): qty 1

## 2022-01-08 MED ORDER — INSULIN ASPART 100 UNIT/ML IJ SOLN
0.0000 [IU] | INTRAMUSCULAR | Status: DC
  Administered 2022-01-08 (×2): 2 [IU] via SUBCUTANEOUS

## 2022-01-08 MED ORDER — INSULIN DETEMIR 100 UNIT/ML ~~LOC~~ SOLN
15.0000 [IU] | Freq: Every day | SUBCUTANEOUS | Status: DC
  Administered 2022-01-08: 15 [IU] via SUBCUTANEOUS
  Filled 2022-01-08 (×2): qty 0.15

## 2022-01-08 MED ORDER — TRAMADOL HCL 50 MG PO TABS: 50.0000 mg | ORAL_TABLET | Freq: Four times a day (QID) | ORAL | Status: DC | PRN

## 2022-01-08 MED ORDER — SODIUM CHLORIDE 0.9% IV SOLUTION: Freq: Once | INTRAVENOUS | Status: DC

## 2022-01-08 MED ORDER — METOCLOPRAMIDE HCL 5 MG/ML IJ SOLN
10.0000 mg | Freq: Four times a day (QID) | INTRAMUSCULAR | Status: AC
Start: 2022-01-08 — End: 2022-01-09
  Administered 2022-01-08 – 2022-01-09 (×4): 10 mg via INTRAVENOUS
  Filled 2022-01-08 (×4): qty 2

## 2022-01-08 NOTE — Progress Notes (Signed)
1 Day Post-Op Procedure(s) (LRB): ?CORONARY ARTERY BYPASS GRAFTING (CABG) X FOUR BYPASSES USING OPEN LEFT INTERNAL MAMMARY ARTERY AND  ENDOSCOPIC RIGHT AND LEFT GREATER SAPHENOUS VEIN HARVEST. (N/A) ?TRANSESOPHAGEAL ECHOCARDIOGRAM (TEE) (N/A) ?Subjective: ?Feels ok this am. ? ?Had some dizziness and orthostatic hypotension when sitting up on side of bed this am.  ? ?Objective: ?Vital signs in last 24 hours: ?Temp:  [95.5 ?F (35.3 ?C)-98.8 ?F (37.1 ?C)] 98.8 ?F (37.1 ?C) (05/02 0600) ?Pulse Rate:  [49-92] 80 (05/02 0600) ?Cardiac Rhythm: Atrial paced (05/02 0400) ?Resp:  [9-28] 15 (05/02 0600) ?BP: (118)/(53) 118/53 (05/01 1353) ?SpO2:  [94 %-100 %] 100 % (05/02 0600) ?Arterial Line BP: (106-160)/(34-71) 125/55 (05/02 0600) ?FiO2 (%):  [40 %-50 %] 40 % (05/01 2102) ?Weight:  [80.2 kg] 80.2 kg (05/02 0500) ? ?Hemodynamic parameters for last 24 hours: ?PAP: (14-33)/(-1-15) 23/7 ?CO:  [2.3 L/min-5.2 L/min] 4.1 L/min ?CI:  [1.3 L/min/m2-2.8 L/min/m2] 2.3 L/min/m2 ? ?Intake/Output from previous day: ?05/01 0701 - 05/02 0700 ?In: 4938.5 [P.O.:120; I.V.:3369.2; Blood:409; IV Piggyback:1040.4] ?Out: 0160 [Urine:5500; Drains:80; Blood:763; Chest Tube:240] ?Intake/Output this shift: ?Total I/O ?In: 1207.2 [P.O.:120; I.V.:696.2; IV Piggyback:391] ?Out: 915 [Urine:775; Chest Tube:140] ? ?General appearance: alert and cooperative ?Neurologic: intact ?Heart: regular rate and rhythm ?Lungs: clear to auscultation bilaterally ?Extremities: edema mild ?Wound: dressings dry. ?Chest tube output low. ?Leg drain output low. ? ?Lab Results: ?Recent Labs  ?  01/07/22 ?1952 01/07/22 ?2123 01/07/22 ?2227 01/08/22 ?1093  ?WBC 19.0*  --   --  18.8*  ?HGB 8.3*   < > 8.5* 7.7*  ?HCT 25.1*   < > 25.0* 23.3*  ?PLT 138*  --   --  144*  ? < > = values in this interval not displayed.  ? ?BMET:  ?Recent Labs  ?  01/07/22 ?1952 01/07/22 ?2123 01/07/22 ?2227 01/08/22 ?2355  ?NA 138   < > 137 135  ?K 5.1   < > 4.8 4.6  ?CL 107  --   --  106  ?CO2 21*  --    --  20*  ?GLUCOSE 128*  --   --  113*  ?BUN 18  --   --  19  ?CREATININE 1.40*  --   --  1.47*  ?CALCIUM 7.6*  --   --  7.8*  ? < > = values in this interval not displayed.  ?  ?PT/INR:  ?Recent Labs  ?  01/07/22 ?1357  ?LABPROT 19.3*  ?INR 1.7*  ? ?ABG ?   ?Component Value Date/Time  ? PHART 7.376 01/07/2022 2227  ? HCO3 23.3 01/07/2022 2227  ? TCO2 24 01/07/2022 2227  ? ACIDBASEDEF 2.0 01/07/2022 2227  ? O2SAT 98 01/07/2022 2227  ? ?CBG (last 3)  ?Recent Labs  ?  01/08/22 ?0158 01/08/22 ?0405 01/08/22 ?0527  ?GLUCAP 108* 114* 123*  ? ?CXR: clear ? ?ECG: NSR 68, old LBBB. ? ?Assessment/Plan: ?S/P Procedure(s) (LRB): ?CORONARY ARTERY BYPASS GRAFTING (CABG) X FOUR BYPASSES USING OPEN LEFT INTERNAL MAMMARY ARTERY AND  ENDOSCOPIC RIGHT AND LEFT GREATER SAPHENOUS VEIN HARVEST. (N/A) ?TRANSESOPHAGEAL ECHOCARDIOGRAM (TEE) (N/A) ? ?POD 1 ?Hemodynamics stable but requiring neo and orthostatic this am. Sinus 68 under pacer. Will continue AAI 80 to support BP. Hold BB. ? ?Expected acute postop blood loss anemia: Will transfuse 1 unit PRBC's this am since still on neo and orthostatic. Start iron. ? ?Stage 3 CKD: creat stable. Hold on diuresis today. Continue dopamine 2 mcg. ? ?Glucose under good control. Hgb A1c 5.5 preop on no  meds. Will give a dose of Levemir this am and start SSI, DC insulin drip.  ? ?DC chest tubes, swan, arterial line. ? ?Keep leg drains in for now. ? ?IS, OOB. ? ? LOS: 7 days  ? ? ?Gaye Pollack ?01/08/2022 ? ? ?

## 2022-01-08 NOTE — Progress Notes (Signed)
? ?   ?  [address] ?      [place] [address] ?            [phone]   ? ?  ?POD # 1 CABG ? ?Resting currently ? ?BP (!) 128/49   Pulse 80   Temp 97.9 ?F (36.6 ?C) (Axillary)   Resp 14   Ht 5' (1.524 m)   Wt 80.2 kg   SpO2 100%   BMI 34.53 kg/m?  ?Dopamine at 2, neo @ 25 ? ?Intake/Output Summary (Last 24 hours) at 01/08/2022 1943 ?Last data filed at 01/08/2022 1900 ?Gross per 24 hour  ?Intake 2454.08 ml  ?Output 1580 ml  ?Net 874.08 ml  ? ?Creatinine 1.44 (1.47 earlier), K= 4.4 ?Hct 27 ? ?Continue current care ? ?Revonda Standard Roxan Hockey, MD ?[facility] ?[phone] ? ? ?

## 2022-01-08 NOTE — Discharge Instructions (Signed)

## 2022-01-08 NOTE — Discharge Summary (Signed)
? ?   ?[address] ?      [place] [address] ?            [phone]   ? ?Physician Discharge Summary  ?Patient ID: ?Katherine Brooks ?MRN: [id number] ?DOB/AGE: 1936-04-20 86 y.o. ? ?Admit date: 01/01/2022 ?Discharge date: 01/15/2022 ? ?Admission Diagnoses: ? ?Patient Active Problem List  ? Diagnosis Date Noted  ? HFrEF (heart failure with reduced ejection fraction) ([place]) 01/01/2022  ? CAD (coronary artery disease) 01/01/2022  ? Anemia in stage 4 chronic kidney disease ([place]) 12/31/2021  ? Grade I diastolic dysfunction 14/97/0263  ? Allergic rhinitis 11/08/2021  ? Left bundle branch block 11/06/2021  ? DOE (dyspnea on exertion) 11/06/2021  ? Pedal edema 11/06/2021  ? Other fatigue 11/06/2021  ? Estrogen deficiency 02/26/2021  ? Chronic kidney disease (CKD) stage G4/A1, severely decreased glomerular filtration rate (GFR) between 15-29 mL/min/1.73 square meter and albuminuria creatinine ratio less than 30 mg/g ([facility]) 10/30/2020  ? Dysgeusia 08/30/2020  ? Obesity (BMI 35.0-39.9 without comorbidity) 03/20/2018  ? Hypokalemia 03/15/2018  ? Renal insufficiency, mild 02/18/2011  ? Osteoporosis 02/06/2011  ? Primary hypertension 10/17/2010  ? Hyperlipidemia 10/17/2010  ? ?Discharge Diagnoses:  ?Patient Active Problem List  ? Diagnosis Date Noted  ? S/P CABG x 4 01/08/2022  ? HFrEF (heart failure with reduced ejection fraction) ([name]) 01/01/2022  ? CAD (coronary artery disease) 01/01/2022  ? Anemia in stage 4 chronic kidney disease ([name]) 12/31/2021  ? Grade I diastolic dysfunction 78/58/8502  ? Allergic rhinitis 11/08/2021  ? Left bundle branch block 11/06/2021  ? DOE (dyspnea on exertion) 11/06/2021  ? Pedal edema 11/06/2021  ? Other fatigue 11/06/2021  ? Estrogen deficiency 02/26/2021  ? Chronic kidney disease (CKD) stage G4/A1, severely decreased glomerular filtration rate (GFR) between 15-29 mL/min/1.73 square meter and albuminuria creatinine ratio less than 30 mg/g ([facility]) 10/30/2020  ? Dysgeusia 08/30/2020  ?  Obesity (BMI 35.0-39.9 without comorbidity) 03/20/2018  ? Hypokalemia 03/15/2018  ? Renal insufficiency, mild 02/18/2011  ? Osteoporosis 02/06/2011  ? Primary hypertension 10/17/2010  ? Hyperlipidemia 10/17/2010  ? ?Discharged Condition: good ? ?History of Present Illness: ? ? We are asked to see this 86 year old female in cardiothoracic surgical consultation for consideration of coronary artery surgical revascularization.  The patient has multiple cardiac risk factors including hypertension, left bundle branch block, and recent finding of ischemic cardiomyopathy with ejection fraction 30 to 35%.  Other significant medical comorbidities include stage III chronic kidney disease.  Her most recent BUN and creatinine today are 31/1.71.  This appears to be around her recent baseline.  Patient developed persistent dyspnea on exertion following a COVID infection earlier this year.  Echocardiogram revealed LV dysfunction with decreased EF as described above.  She was started on Coreg and Entresto and subsequently underwent stress testing on 12/28/2021 which showed a large region of mid and distal anterior and anteroseptal ischemia.  She was felt to be appropriate to proceed with diagnostic cardiac catheterization which was done and showed severe multivessel coronary artery disease including 90% distal left main/ostial LAD stenosis along with proximal 90% disease in the left circumflex and RCA.  Chest x-ray reveals significant calcification in the aorta as well as findings consistent with possible interstitial lung disease.  She is notably active and functional.  We are being asked to consider for multivessel CABG.  She was evaluated by Dr. Cyndia Bent who was in agreement the patient would be a reasonable candidate for surgery.  The risks and benefits  of the procedure were explained to the patient and she was agreeable to proceed. ? ?Hospital Course: ?  ?[name] was taken to the operating room on 01/07/2022.  She underwent CABG  x 4 utilizing LIMA to LAD, SVG to OM, SVG to Diagonal, and SVG to PDA.  She underwent Endoscopic harvest of greater saphenous vein from her right and left thigh.  She did require placement of bilateral JP drains.  She was also placed renal dose dopamine for her baseline Stg 3 CKD.  She tolerated the procedure without difficulty and was taken to the SICU in stable condition.  The patient was extubated the evening of surgery.  She was hypotensive and orthostatic requiring Neo Synephrine for additional support.  This will be weaned as hemodynamics allowed.  She was also on renal dose Dopamine due to her underlying Stage 3 CKD.  Her creatinine remained stable post operatively.  The patient had post operative blood loss anemia.  She was transfused unit of PRBCs with Hgb of 7.7.  Iron therapy was also initiated.  Her chest tubes, arterial lines, and swan ganz catheter was removed without difficulty.  The patient continued to have issues with hypotension.  She was started on Midodrine to help facilitate wean of Neo-synephrine, this was difficult as patient had issues with low pressures.  She developed leukocytosis that was not due to a clear source.  Urinalysis and culture was obtained.  This showed no growth.  Her JP drains were removed from her bilateral LE extremities.  She was evaluated by PT/OT who recommended home services, however the patient declined. Sleeve was removed on 05/05. She was surgically stable for transfer from the ICU to 4E on 01/11/2022.   The patient remains in NSR with occasional PVC.  Her blood pressure improved and Midodrine was decreased to 5 mg TID.  She is ambulating without difficulty.  Her surgical incisions are healing without evidence of infection.  He is medically stable for discharge home today. ? ?Consults: None ? ?Significant Diagnostic Studies: angiography:  ? ?Conclusions: ?Severe multivessel coronary artery disease, including 60% ostial and 90% distal LMCA stenoses extending into the  ostium of the LAD, sequential 90% proximal and 60% mid LAD lesion, and 90% proximal LCx and RCA stenoses. ?Upper normal left ventricular filling pressure (LVEDP 15 mmHg). ?  ?Treatments: surgery:  ? ?01/07/2022 ?  ?Surgeon:  Gaye Pollack, MD ?  ?First Assistant: Ellwood Handler,  PA-C:   An experienced assistant was required given the complexity of this surgery and the standard of surgical care. The assistant was needed for endoscopic vein harvesting, exposure, dissection, suctioning, retraction of delicate tissues and sutures, instrument exchange and for overall help during this procedure. ?   ?Preoperative Diagnosis:  Severe left main and multi-vessel coronary artery disease ?  ? Postoperative Diagnosis:  Same ?   ?Procedure: ?  ?Median Sternotomy ?Extracorporeal circulation ?3.   Coronary artery bypass grafting x 4 ?  ?Left internal mammary artery graft to the LAD ?SVG to diagonal ?SVG to OM ?SVG to PDA ?4.   Endoscopic vein harvest from both legs ?  ?Discharge Exam: ?Blood pressure (!) 127/59, pulse 96, temperature 98.6 ?F (37 ?C), temperature source Oral, resp. rate 18, height 5' (1.524 m), weight 80.2 kg, SpO2 98 %. ? ?General appearance: alert, cooperative, and no distress ?Heart: regular rate and rhythm ?Lungs: clear to auscultation bilaterally ?Abdomen: soft, non-tender; bowel sounds normal; no masses,  no organomegaly ?Extremities: edema trace ?Wound: clean and dry, ecchymosis  BLE ?  ?Discharge Medications: ? ?The patient has been discharged on: ? ? ?1.Beta Blocker:  Yes [   ] ?                             No   [  X ] ?                             If No, reason: labile BP ? ?2.Ace Inhibitor/ARB: Yes [   ] ?                                    No  [ x   ] ?                                    If No, reason: H/O CKD Stage 3 ? ?3.Statin:   Yes [ X  ] ?                 No  [   ] ?                 If No, reason: ? ?4.Ecasa:  Yes  [ X  ] ?                 No   [   ] ?                 If No, reason: ? ?Patient had  ACS upon admission: No ? ?Plavix/P2Y12 inhibitor: Yes [   ] ?                                     No  [  N ] ? ? ?Discharge Instructions   ? ? Amb Referral to Cardiac Rehabilitation   Complete by: As d

## 2022-01-09 ENCOUNTER — Inpatient Hospital Stay (HOSPITAL_COMMUNITY): Payer: Medicare PPO

## 2022-01-09 LAB — ECHO INTRAOPERATIVE TEE
Height: 60 in
Weight: 2779.2 oz

## 2022-01-09 LAB — GLUCOSE, CAPILLARY
Glucose-Capillary: 107 mg/dL — ABNORMAL HIGH (ref 70–99)
Glucose-Capillary: 107 mg/dL — ABNORMAL HIGH (ref 70–99)
Glucose-Capillary: 141 mg/dL — ABNORMAL HIGH (ref 70–99)
Glucose-Capillary: 96 mg/dL (ref 70–99)

## 2022-01-09 LAB — CBC
HCT: 24.7 % — ABNORMAL LOW (ref 36.0–46.0)
Hemoglobin: 8 g/dL — ABNORMAL LOW (ref 12.0–15.0)
MCH: 28.9 pg (ref 26.0–34.0)
MCHC: 32.4 g/dL (ref 30.0–36.0)
MCV: 89.2 fL (ref 80.0–100.0)
Platelets: 123 10*3/uL — ABNORMAL LOW (ref 150–400)
RBC: 2.77 MIL/uL — ABNORMAL LOW (ref 3.87–5.11)
RDW: 15.2 % (ref 11.5–15.5)
WBC: 16.6 10*3/uL — ABNORMAL HIGH (ref 4.0–10.5)
nRBC: 0 % (ref 0.0–0.2)

## 2022-01-09 LAB — BASIC METABOLIC PANEL
Anion gap: 8 (ref 5–15)
BUN: 22 mg/dL (ref 8–23)
CO2: 22 mmol/L (ref 22–32)
Calcium: 8.1 mg/dL — ABNORMAL LOW (ref 8.9–10.3)
Chloride: 104 mmol/L (ref 98–111)
Creatinine, Ser: 1.56 mg/dL — ABNORMAL HIGH (ref 0.44–1.00)
GFR, Estimated: 33 mL/min — ABNORMAL LOW (ref >60)
Glucose, Bld: 101 mg/dL — ABNORMAL HIGH (ref 70–99)
Potassium: 4.1 mmol/L (ref 3.5–5.1)
Sodium: 134 mmol/L — ABNORMAL LOW (ref 135–145)

## 2022-01-09 MED ORDER — SIMETHICONE 80 MG PO CHEW
160.0000 mg | CHEWABLE_TABLET | Freq: Four times a day (QID) | ORAL | Status: DC | PRN
  Administered 2022-01-09: 160 mg via ORAL
  Filled 2022-01-09: qty 2

## 2022-01-09 MED ORDER — ENOXAPARIN SODIUM 30 MG/0.3ML IJ SOSY
30.0000 mg | PREFILLED_SYRINGE | Freq: Every day | INTRAMUSCULAR | Status: DC
Start: 2022-01-09 — End: 2022-01-15
  Administered 2022-01-09 – 2022-01-14 (×6): 30 mg via SUBCUTANEOUS
  Filled 2022-01-09 (×6): qty 0.3

## 2022-01-09 MED FILL — Thrombin (Recombinant) For Soln 20000 Unit: CUTANEOUS | Qty: 1 | Status: AC

## 2022-01-09 NOTE — Evaluation (Signed)
Physical Therapy Evaluation ?Patient Details ?Name: Katherine Brooks ?MRN: [id number] ?DOB: 09-Sep-1936 ?Today's Date: 01/09/2022 ? ?History of Present Illness ? Pt is a 86 y.o. F who is s/p CABG x 4 01/07/2022. Significant PMH: CAD, CKD, LBBB, ischemic cardiomyopathy.  ?Clinical Impression ? Pt s/p CABG x 4 5/1. Received up in chair and agreeable to PT session. Pt presents with decreased functional mobility secondary to pain, generalized weakness, impaired balance and decreased activity tolerance. Pt requiring min assist for transfers with use of sternal pillow and ambulating 40 ft with a walker at a min guard assist level. HR 80, SpO2 99% on 2L O2, BP 134/47 (72). Pt with + nausea at end of session; RN aware. Suspect steady progress.   ? ?Recommendations for follow up therapy are one component of a multi-disciplinary discharge planning process, led by the attending physician.  Recommendations may be updated based on patient status, additional functional criteria and insurance authorization. ? ?Follow Up Recommendations Home health PT ? ?  ?Assistance Recommended at Discharge PRN  ?Patient can return home with the following ? A little help with walking and/or transfers;A little help with bathing/dressing/bathroom;Assistance with cooking/housework;Assist for transportation;Help with stairs or ramp for entrance ? ?  ?Equipment Recommendations None recommended by PT (pt well equipped)  ?Recommendations for Other Services ?    ?  ?Functional Status Assessment Patient has had a recent decline in their functional status and demonstrates the ability to make significant improvements in function in a reasonable and predictable amount of time.  ? ?  ?Precautions / Restrictions Precautions ?Precautions: Sternal;Fall ?Precaution Booklet Issued: No ?Precaution Comments: Temp pacer ?Restrictions ?Weight Bearing Restrictions: Yes ?Other Position/Activity Restrictions:  (sternal precautions)  ? ?  ? ?Mobility ? Bed Mobility ?  ?  ?  ?  ?  ?   ?  ?General bed mobility comments: OOB in chair ?  ? ?Transfers ?Overall transfer level: Needs assistance ?Equipment used: Rolling walker (2 wheels) ?Transfers: Sit to/from Stand, Bed to chair/wheelchair/BSC ?Sit to Stand: Min assist ?Stand pivot transfers: Min assist ?  ?  ?  ?  ?General transfer comment: Light minA for steadying assist. Pivoting from recliner to [facility] ?  ? ?Ambulation/Gait ?Ambulation/Gait assistance: Min guard ?Gait Distance (Feet): 40 Feet ?Assistive device: Rolling walker (2 wheels) ?Gait Pattern/deviations: Step-through pattern, Decreased stride length, Trunk flexed ?Gait velocity: decreased ?Gait velocity interpretation: <1.8 ft/sec, indicate of risk for recurrent falls ?  ?General Gait Details: Slow, but steady pace, cues for activity pacing and upright posture. min guard for safety ? ?Stairs ?  ?  ?  ?  ?  ? ?Wheelchair Mobility ?  ? ?Modified Rankin (Stroke Patients Only) ?  ? ?  ? ?Balance Overall balance assessment: Needs assistance ?Sitting-balance support: Feet supported ?Sitting balance-Leahy Scale: Fair ?  ?  ?Standing balance support: Bilateral upper extremity supported ?Standing balance-Leahy Scale: Poor ?  ?  ?  ?  ?  ?  ?  ?  ?  ?  ?  ?  ?   ? ? ? ?Pertinent Vitals/Pain Pain Assessment ?Pain Assessment: Faces ?Faces Pain Scale: Hurts little more ?Pain Location: sternal ?Pain Descriptors / Indicators: Discomfort, Grimacing, Guarding ?Pain Intervention(s): Monitored during session, Limited activity within patient's tolerance  ? ? ?Home Living Family/patient expects to be discharged to:: Private residence ?Living Arrangements: Spouse/significant other ?Available Help at Discharge: Family ?Type of Home: House ?Home Access: Stairs to enter;Ramped entrance ?  ?Entrance Stairs-Number of Steps: 3 ?  ?Home Layout:  Able to live on main level with bedroom/bathroom ?Home Equipment: Conservation officer, nature (2 wheels);Rollator (4 wheels);Cane - single point ?   ?  ?Prior Function Prior Level of Function  : Independent/Modified Independent ?  ?  ?  ?  ?  ?  ?  ?  ?  ? ? ?Hand Dominance  ?   ? ?  ?Extremity/Trunk Assessment  ? Upper Extremity Assessment ?Upper Extremity Assessment: Defer to OT evaluation ?  ? ?Lower Extremity Assessment ?Lower Extremity Assessment: Generalized weakness ?  ? ?   ?Communication  ? Communication: No difficulties  ?Cognition Arousal/Alertness: Awake/alert ?Behavior During Therapy: [facility] for tasks assessed/performed ?Overall Cognitive Status: Within Functional Limits for tasks assessed ?  ?  ?  ?  ?  ?  ?  ?  ?  ?  ?  ?  ?  ?  ?  ?  ?  ?  ?  ? ?  ?General Comments   ? ?  ?Exercises    ? ?Assessment/Plan  ?  ?PT Assessment Patient needs continued PT services  ?PT Problem List Decreased strength;Decreased activity tolerance;Decreased balance;Decreased mobility;Pain ? ?   ?  ?PT Treatment Interventions DME instruction;Gait training;Stair training;Functional mobility training;Therapeutic activities;Therapeutic exercise;Balance training;Patient/family education   ? ?PT Goals (Current goals can be found in the Care Plan section)  ?Acute Rehab PT Goals ?Patient Stated Goal: less nausea ?PT Goal Formulation: With patient ?Time For Goal Achievement: 01/23/22 ?Potential to Achieve Goals: Good ? ?  ?Frequency Min 3X/week ?  ? ? ?Co-evaluation   ?  ?  ?  ?  ? ? ?  ?AM-PAC PT "6 Clicks" Mobility  ?Outcome Measure Help needed turning from your back to your side while in a flat bed without using bedrails?: A Little ?Help needed moving from lying on your back to sitting on the side of a flat bed without using bedrails?: A Little ?Help needed moving to and from a bed to a chair (including a wheelchair)?: A Little ?Help needed standing up from a chair using your arms (e.g., wheelchair or bedside chair)?: A Little ?Help needed to walk in hospital room?: A Little ?Help needed climbing 3-5 steps with a railing? : A Lot ?6 Click Score: 17 ? ?  ?End of Session Equipment Utilized During Treatment: Oxygen ?Activity  Tolerance: Patient tolerated treatment well ?Patient left: in chair;with call bell/phone within reach ?Nurse Communication: Mobility status ?PT Visit Diagnosis: Unsteadiness on feet (R26.81);Difficulty in walking, not elsewhere classified (R26.2);Pain ?Pain - part of body:  (chest) ?  ? ?Time: 5883-2549 ?PT Time Calculation (min) (ACUTE ONLY): 25 min ? ? ?Charges:   PT Evaluation ?$PT Eval Moderate Complexity: 1 Mod ?PT Treatments ?$Gait Training: 8-22 mins ?  ?   ? ? ?Wyona Almas, PT, DPT ?Acute Rehabilitation Services ?Pager [phone] ?Office [phone] ? ? ?Katherine Brooks ?01/09/2022, 12:31 PM ? ?

## 2022-01-09 NOTE — Evaluation (Signed)
Occupational Therapy Evaluation ?Patient Details ?Name: Katherine Brooks ?MRN: [id number] ?DOB: 03/08/1936 ?Today's Date: 01/09/2022 ? ? ?History of Present Illness Pt is a 86 y.o. F who is s/p CABG x 4 01/07/2022. Significant PMH: CAD, CKD, LBBB, ischemic cardiomyopathy.  ? ?Clinical Impression ?  ?Patient admitted for the procedure above.  PTA she lives with her daughter, who is able to assist as needed.  She is complaining of sternal and abdominal pain, but is moving quite well.  OT to continue efforts in the acute setting, and if she continues to progress, home with assist as needed and [facility] OT is a possibility.    ?   ? ?Recommendations for follow up therapy are one component of a multi-disciplinary discharge planning process, led by the attending physician.  Recommendations may be updated based on patient status, additional functional criteria and insurance authorization.  ? ?Follow Up Recommendations ? Home health OT  ?  ?Assistance Recommended at Discharge Intermittent Supervision/Assistance  ?Patient can return home with the following A little help with walking and/or transfers;A little help with bathing/dressing/bathroom;Assistance with cooking/housework;Assist for transportation ? ?  ?Functional Status Assessment ? Patient has had a recent decline in their functional status and demonstrates the ability to make significant improvements in function in a reasonable and predictable amount of time.  ?Equipment Recommendations ? Tub/shower seat  ?  ?Recommendations for Other Services   ? ? ?  ?Precautions / Restrictions Precautions ?Precautions: Sternal;Fall ?Precaution Booklet Issued: No ?Precaution Comments: Temporary pacer ?Restrictions ?Weight Bearing Restrictions: Yes ?RUE Weight Bearing: Partial weight bearing ?LUE Weight Bearing: Partial weight bearing ?Other Position/Activity Restrictions: pushing through B thighs for sit to stand.  ? ?  ? ?Mobility Bed Mobility ?Overal bed mobility: Needs Assistance ?Bed  Mobility: Sit to Supine ?  ?  ?  ?Sit to supine: Min assist ?  ?General bed mobility comments: bring feet up, patient able to mange her trunk ?Patient Response: Cooperative ? ?Transfers ?Overall transfer level: Needs assistance ?Equipment used: Rolling walker (2 wheels) ?Transfers: Sit to/from Stand, Bed to chair/wheelchair/BSC ?Sit to Stand: Min assist ?Stand pivot transfers: Min assist ?  ?  ?  ?  ?  ?  ? ?  ?Balance Overall balance assessment: Needs assistance ?Sitting-balance support: Feet unsupported, No upper extremity supported ?Sitting balance-Leahy Scale: Fair ?  ?  ?Standing balance support: Single extremity supported ?Standing balance-Leahy Scale: Poor ?  ?  ?  ?  ?  ?  ?  ?  ?  ?  ?  ?  ?   ? ?ADL either performed or assessed with clinical judgement  ? ?ADL   ?  ?  ?Grooming: Wash/dry hands;Wash/dry face;Set up;Sitting ?  ?  ?  ?  ?  ?Upper Body Dressing : Moderate assistance;Sitting ?  ?Lower Body Dressing: Moderate assistance;Sit to/from stand ?  ?Toilet Transfer: Minimal assistance ?  ?  ?  ?  ?  ?  ?   ? ? ? ?Vision Patient Visual Report: No change from baseline ?   ?   ?Perception Perception ?Perception: Within Functional Limits ?  ?Praxis Praxis ?Praxis: Intact ?  ? ?Pertinent Vitals/Pain Pain Assessment ?Pain Assessment: Faces ?Faces Pain Scale: Hurts little more ?Pain Location: stomach cramps ?Pain Descriptors / Indicators: Discomfort, Grimacing, Guarding ?Pain Intervention(s): Monitored during session  ? ? ? ?Hand Dominance Right ?  ?Extremity/Trunk Assessment Upper Extremity Assessment ?Upper Extremity Assessment: Overall WFL for tasks assessed ?  ?Lower Extremity Assessment ?Lower Extremity Assessment: Defer to  PT evaluation ?  ?Cervical / Trunk Assessment ?Cervical / Trunk Assessment: Kyphotic ?  ?Communication Communication ?Communication: No difficulties ?  ?Cognition Arousal/Alertness: Awake/alert ?Behavior During Therapy: [facility] for tasks assessed/performed ?Overall Cognitive Status: Within  Functional Limits for tasks assessed ?  ?  ?  ?  ?  ?  ?  ?  ?  ?  ?  ?  ?  ?  ?  ?  ?  ?  ?  ?General Comments   VSS on RA ? ?  ?Exercises   ?  ?Shoulder Instructions    ? ? ?Home Living Family/patient expects to be discharged to:: Private residence ?Living Arrangements: Spouse/significant other ?Available Help at Discharge: Family ?Type of Home: House ?Home Access: Stairs to enter;Ramped entrance ?Entrance Stairs-Number of Steps: 3 ?  ?Home Layout: Able to live on main level with bedroom/bathroom ?  ?  ?Bathroom Shower/Tub: Tub/shower unit ?  ?Bathroom Toilet: Standard ?Bathroom Accessibility: Yes ?How Accessible: Accessible via walker ?Home Equipment: Conservation officer, nature (2 wheels);Rollator (4 wheels);Cane - single point ?  ?  ?  ? ?  ?Prior Functioning/Environment Prior Level of Function : Independent/Modified Independent ?  ?  ?  ?  ?  ?  ?  ?ADLs Comments: No assist at PLOF.  Daughter assists as needed with community mobility and IADL ?  ? ?  ?  ?OT Problem List: Decreased strength;Decreased activity tolerance;Impaired balance (sitting and/or standing);Pain ?  ?   ?OT Treatment/Interventions: Self-care/ADL training;Patient/family education;Balance training;Therapeutic activities;DME and/or AE instruction  ?  ?OT Goals(Current goals can be found in the care plan section) Acute Rehab OT Goals ?Patient Stated Goal: Motivated to go home ?OT Goal Formulation: With patient ?Time For Goal Achievement: 01/23/22 ?Potential to Achieve Goals: Good ?ADL Goals ?Pt Will Perform Grooming: with supervision;standing ?Pt Will Perform Upper Body Dressing: with supervision;sitting ?Pt Will Perform Lower Body Dressing: with supervision;sit to/from stand ?Pt Will Transfer to Toilet: with modified independence;ambulating;regular height toilet  ?OT Frequency: Min 2X/week ?  ? ?Co-evaluation   ?  ?  ?  ?  ? ?  ?AM-PAC OT "6 Clicks" Daily Activity     ?Outcome Measure Help from another person eating meals?: None ?Help from another person  taking care of personal grooming?: None ?Help from another person toileting, which includes using toliet, bedpan, or urinal?: A Little ?Help from another person bathing (including washing, rinsing, drying)?: A Lot ?Help from another person to put on and taking off regular upper body clothing?: A Lot ?Help from another person to put on and taking off regular lower body clothing?: A Lot ?6 Click Score: 17 ?  ?End of Session Equipment Utilized During Treatment: Oxygen ?Nurse Communication: Mobility status ? ?Activity Tolerance: Patient tolerated treatment well ?Patient left: in bed;with call bell/phone within reach ? ?OT Visit Diagnosis: Unsteadiness on feet (R26.81);Muscle weakness (generalized) (M62.81)  ?              ?Time: 6759-1638 ?OT Time Calculation (min): 17 min ?Charges:  OT General Charges ?$OT Visit: 1 Visit ?OT Evaluation ?$OT Eval Moderate Complexity: 1 Mod ? ?01/09/2022 ? ?RP, OTR/L ? ?Acute Rehabilitation Services ? ?Office:  [phone] ? ? ?Rodrickus Min D Sharen Youngren ?01/09/2022, 3:52 PM ?

## 2022-01-09 NOTE — Progress Notes (Signed)
2 Days Post-Op Procedure(s) (LRB): ?CORONARY ARTERY BYPASS GRAFTING (CABG) X FOUR BYPASSES USING OPEN LEFT INTERNAL MAMMARY ARTERY AND  ENDOSCOPIC RIGHT AND LEFT GREATER SAPHENOUS VEIN HARVEST. (N/A) ?TRANSESOPHAGEAL ECHOCARDIOGRAM (TEE) (N/A) ?Subjective: ?Feels ok. No further nausea. Ambulated some. Up in chair this am. No appetite yet. ? ?Objective: ?Vital signs in last 24 hours: ?Temp:  [97.9 ?F (36.6 ?C)-98.6 ?F (37 ?C)] 98.1 ?F (36.7 ?C) (05/03 7412) ?Pulse Rate:  [79-95] 80 (05/03 0500) ?Cardiac Rhythm: Atrial paced (05/03 0400) ?Resp:  [11-23] 17 (05/03 0600) ?BP: (99-135)/(39-55) 107/40 (05/03 0500) ?SpO2:  [97 %-100 %] 99 % (05/03 0500) ?Arterial Line BP: (61-148)/(41-98) 79/73 (05/03 0500) ?Weight:  [80.2 kg] 80.2 kg (05/03 0500) ? ?Hemodynamic parameters for last 24 hours: ?PAP: (23-28)/(7-11) 28/11 ? ?Intake/Output from previous day: ?05/02 0701 - 05/03 0700 ?In: 1717.9 [P.O.:120; I.V.:979; Blood:331.3; IV Piggyback:287.6] ?Out: 665 [Urine:475; Drains:40; Chest Tube:150] ?Intake/Output this shift: ?No intake/output data recorded. ? ?General appearance: alert and cooperative ?Neurologic: intact ?Heart: regular rate and rhythm ?Lungs: clear to auscultation bilaterally ?Extremities: edema mild ?Wound: dressings dry ? ?Lab Results: ?Recent Labs  ?  01/08/22 ?1505 01/09/22 ?0402  ?WBC 17.3* 16.6*  ?HGB 8.8* 8.0*  ?HCT 27.2* 24.7*  ?PLT 134* 123*  ? ?BMET:  ?Recent Labs  ?  01/08/22 ?1505 01/09/22 ?0402  ?NA 137 134*  ?K 4.4 4.1  ?CL 106 104  ?CO2 21* 22  ?GLUCOSE 117* 101*  ?BUN 20 22  ?CREATININE 1.44* 1.56*  ?CALCIUM 8.0* 8.1*  ?  ?PT/INR:  ?Recent Labs  ?  01/07/22 ?1357  ?LABPROT 19.3*  ?INR 1.7*  ? ?ABG ?   ?Component Value Date/Time  ? PHART 7.376 01/07/2022 2227  ? HCO3 23.3 01/07/2022 2227  ? TCO2 24 01/07/2022 2227  ? ACIDBASEDEF 2.0 01/07/2022 2227  ? O2SAT 98 01/07/2022 2227  ? ?CBG (last 3)  ?Recent Labs  ?  01/09/22 ?8786 01/09/22 ?7672 01/09/22 ?0947  ?GLUCAP 96 107* 107*  ? ?CXR:  ok ? ?Assessment/Plan: ?S/P Procedure(s) (LRB): ?CORONARY ARTERY BYPASS GRAFTING (CABG) X FOUR BYPASSES USING OPEN LEFT INTERNAL MAMMARY ARTERY AND  ENDOSCOPIC RIGHT AND LEFT GREATER SAPHENOUS VEIN HARVEST. (N/A) ?TRANSESOPHAGEAL ECHOCARDIOGRAM (TEE) (N/A) ? ?POD 2 ?Hemodynamically stable but still on neo 30 mcg and dop 2 for kidneys. On midodrine 10 tid started last night. Rhythm is sinus 80's. Wean neo as tolerated. ? ?Stage 3 CKD with creat 1.6-1.7 baseline. 1.56 this am. Hold off on diuresis while on vasopressors. ? ?DC CBG's and SSI.  ? ?Continue IS, ambulation. ? ? LOS: 8 days  ? ? ?Gaye Pollack ?01/09/2022 ? ? ?

## 2022-01-09 NOTE — Consult Note (Signed)
? ?  [facility] CM Inpatient Consult ? ? ?01/09/2022 ? ?Katherine Brooks ?Sep 21, 1935 ?[id number] ? ?[place] Organization [ACO] Patient: [employer] ? ?Primary Care Provider:  Eugenia Pancoast, FNP with [facility], is an embedded provider with a Chronic Care Management team and program, and is listed for the transition of care follow up and appointments. ? ?Patient was screened for length of stay  of 7 days and to assess for post hospital follow up needs. Patient remains at ICU level of care noted for s/p CABGx 4 of 2 days post op. Awaiting PT recommendations and inpatient TOC. ? ?Plan: Continue to follow for progression and for disposition needs for post hospital care needs as appropriate for transition with the Embedded provider office. ? ?Please contact for further questions, ? ?Natividad Brood, RN BSN CCM ?[facility] Liaison ? [phone] business mobile phone ?Toll free office [phone]  ?Fax number: [phone] ?[email or web address] ? ? ? ?

## 2022-01-09 NOTE — Progress Notes (Signed)
Patient ID: Katherine Brooks, female   DOB: April 03, 1936, 86 y.o.   MRN: [id number] ?TCTS Evening Rounds: ? ?Hemodynamically stable in sinus rhythm. Off neo and only on dop 2, midodrine. ? ?UO 40/hr ? ?Had BM. ? ?Ambulated. ?

## 2022-01-10 LAB — TYPE AND SCREEN
ABO/RH(D): A POS
Antibody Screen: NEGATIVE
Unit division: 0
Unit division: 0

## 2022-01-10 LAB — BPAM RBC
Blood Product Expiration Date: 202305092359
Blood Product Expiration Date: 202305112359
ISSUE DATE / TIME: 202305020750
Unit Type and Rh: 6200
Unit Type and Rh: 6200

## 2022-01-10 LAB — CBC
HCT: 26.8 % — ABNORMAL LOW (ref 36.0–46.0)
Hemoglobin: 8.6 g/dL — ABNORMAL LOW (ref 12.0–15.0)
MCH: 28.8 pg (ref 26.0–34.0)
MCHC: 32.1 g/dL (ref 30.0–36.0)
MCV: 89.6 fL (ref 80.0–100.0)
Platelets: 181 10*3/uL (ref 150–400)
RBC: 2.99 MIL/uL — ABNORMAL LOW (ref 3.87–5.11)
RDW: 15.1 % (ref 11.5–15.5)
WBC: 21 10*3/uL — ABNORMAL HIGH (ref 4.0–10.5)
nRBC: 0 % (ref 0.0–0.2)

## 2022-01-10 LAB — BASIC METABOLIC PANEL
Anion gap: 6 (ref 5–15)
BUN: 19 mg/dL (ref 8–23)
CO2: 23 mmol/L (ref 22–32)
Calcium: 8.4 mg/dL — ABNORMAL LOW (ref 8.9–10.3)
Chloride: 109 mmol/L (ref 98–111)
Creatinine, Ser: 1.46 mg/dL — ABNORMAL HIGH (ref 0.44–1.00)
GFR, Estimated: 35 mL/min — ABNORMAL LOW (ref >60)
Glucose, Bld: 90 mg/dL (ref 70–99)
Potassium: 4.1 mmol/L (ref 3.5–5.1)
Sodium: 138 mmol/L (ref 135–145)

## 2022-01-10 MED FILL — Sodium Chloride IV Soln 0.9%: INTRAVENOUS | Qty: 3000 | Status: AC

## 2022-01-10 MED FILL — Heparin Sodium (Porcine) Inj 1000 Unit/ML: INTRAMUSCULAR | Qty: 10 | Status: AC

## 2022-01-10 MED FILL — Mannitol IV Soln 20%: INTRAVENOUS | Qty: 500 | Status: AC

## 2022-01-10 MED FILL — Potassium Chloride Inj 2 mEq/ML: INTRAVENOUS | Qty: 40 | Status: AC

## 2022-01-10 MED FILL — Heparin Sodium (Porcine) Inj 1000 Unit/ML: Qty: 1000 | Status: AC

## 2022-01-10 MED FILL — Sodium Bicarbonate IV Soln 8.4%: INTRAVENOUS | Qty: 50 | Status: AC

## 2022-01-10 MED FILL — Albumin, Human Inj 5%: INTRAVENOUS | Qty: 250 | Status: AC

## 2022-01-10 MED FILL — Electrolyte-R (PH 7.4) Solution: INTRAVENOUS | Qty: 5000 | Status: AC

## 2022-01-10 MED FILL — Lidocaine HCl (Cardiac) IV PF Soln 100 MG/5ML (2%): INTRAVENOUS | Qty: 5 | Status: AC

## 2022-01-10 MED FILL — Magnesium Sulfate Inj 50%: INTRAMUSCULAR | Qty: 10 | Status: AC

## 2022-01-10 NOTE — Progress Notes (Signed)
3 Days Post-Op Procedure(s) (LRB): ?CORONARY ARTERY BYPASS GRAFTING (CABG) X FOUR BYPASSES USING OPEN LEFT INTERNAL MAMMARY ARTERY AND  ENDOSCOPIC RIGHT AND LEFT GREATER SAPHENOUS VEIN HARVEST. (N/A) ?TRANSESOPHAGEAL ECHOCARDIOGRAM (TEE) (N/A) ?Subjective: ?No complaints. Abdomen feels better today after BM yesterday. ? ?Objective: ?Vital signs in last 24 hours: ?Temp:  [97.5 ?F (36.4 ?C)-98 ?F (36.7 ?C)] 97.6 ?F (36.4 ?C) (05/04 0400) ?Pulse Rate:  [74-120] 78 (05/04 0600) ?Cardiac Rhythm: Normal sinus rhythm (05/04 0600) ?Resp:  [12-30] 15 (05/04 0600) ?BP: (97-143)/(34-103) 114/40 (05/04 0600) ?SpO2:  [87 %-100 %] 99 % (05/04 0600) ?Weight:  [80.2 kg] 80.2 kg (05/04 0500) ? ?Hemodynamic parameters for last 24 hours: ?  ? ?Intake/Output from previous day: ?05/03 0701 - 05/04 0700 ?In: 1660.7 [P.O.:660; I.V.:909.1; IV Piggyback:91.7] ?Out: 1620 [Urine:1620] ?Intake/Output this shift: ?No intake/output data recorded. ? ?General appearance: alert and cooperative ?Neurologic: intact ?Heart: regular rate and rhythm ?Lungs: clear to auscultation bilaterally ?Abdomen: soft, non-tender; bowel sounds normal; no masses,  no organomegaly ?Extremities: edema mild ?Wound: incisions ok ? ?Lab Results: ?Recent Labs  ?  01/09/22 ?0402 01/10/22 ?8616  ?WBC 16.6* 21.0*  ?HGB 8.0* 8.6*  ?HCT 24.7* 26.8*  ?PLT 123* 181  ? ?BMET:  ?Recent Labs  ?  01/09/22 ?0402 01/10/22 ?0624  ?NA 134* 138  ?K 4.1 4.1  ?CL 104 109  ?CO2 22 23  ?GLUCOSE 101* 90  ?BUN 22 19  ?CREATININE 1.56* 1.46*  ?CALCIUM 8.1* 8.4*  ?  ?PT/INR:  ?Recent Labs  ?  01/07/22 ?1357  ?LABPROT 19.3*  ?INR 1.7*  ? ?ABG ?   ?Component Value Date/Time  ? PHART 7.376 01/07/2022 2227  ? HCO3 23.3 01/07/2022 2227  ? TCO2 24 01/07/2022 2227  ? ACIDBASEDEF 2.0 01/07/2022 2227  ? O2SAT 98 01/07/2022 2227  ? ?CBG (last 3)  ?Recent Labs  ?  01/09/22 ?8372 01/09/22 ?9021 01/09/22 ?1637  ?GLUCAP 107* 107* 141*  ? ? ?Assessment/Plan: ?S/P Procedure(s) (LRB): ?CORONARY ARTERY BYPASS  GRAFTING (CABG) X FOUR BYPASSES USING OPEN LEFT INTERNAL MAMMARY ARTERY AND  ENDOSCOPIC RIGHT AND LEFT GREATER SAPHENOUS VEIN HARVEST. (N/A) ?TRANSESOPHAGEAL ECHOCARDIOGRAM (TEE) (N/A) ? ?POD 3 ? ?Neo resumed overnight for drop in MAP to 50's despite midodrine. Her diastolic BP is low. Will try to wean neo off again and stop. Still on dop 2. ? ?Wt is stable at about 4 lbs over preop.  ? ?UO has been good and creat down slightly today and at baseline. Not diuresing since still on vasopressors. ? ?Hgb improving. Continue iron. ? ?Leukocytosis of unclear etiology. Will check urine culture and remove foley. Hopefully get sleeve out later today. ? ? LOS: 9 days  ? ? ?Gaye Pollack ?01/10/2022 ? ? ?

## 2022-01-10 NOTE — TOC Initial Note (Signed)
Transition of Care (TOC) - Initial/Assessment Note  ? ? ?Patient Details  ?Name: Katherine Brooks ?MRN: [id number] ?Date of Birth: 1936/05/06 ? ?Transition of Care (TOC) CM/SW Contact:    ?Graves-Bigelow, Ocie Cornfield, RN ?Phone Number: ?01/10/2022, 11:30 AM ? ?Clinical Narrative: Case Manager spoke with the patient regarding disposition needs. PTA patient was from home with spouse with additional support from daughters. Patient states she has rolling walkers and a wheelchair in the home. Patient is currently on oxygen; Case Manager will follow to see if she will need 02 for home vs. wean. Case Manager discussed home health services and offered choice and the patient declines home health services. Patient states her husband has had the services in the past and she has learned a lot. Patient reports that her daughter will be providing transportation home once stable. Case Manager will continue to follow for additional disposition needs.                ? ?Expected Discharge Plan: Home/Self Care ?Barriers to Discharge: Continued Medical Work up ? ? ?Patient Goals and CMS Choice ?Patient states their goals for this hospitalization and ongoing recovery are:: patient wants to return home. ?  ?  ? ?Expected Discharge Plan and Services ?Expected Discharge Plan: Home/Self Care ?In-house Referral: NA ?Discharge Planning Services: CM Consult ?  ?Living arrangements for the past 2 months: [place] ?                ?DME Arranged: N/A ?DME Agency: NA ?  ?  ?  ?HH Arranged: Refused HH ?   ? ?Prior Living Arrangements/Services ?Living arrangements for the past 2 months: [place] ?Lives with:: Spouse (has support of daughters.) ?Patient language and need for interpreter reviewed:: Yes ?Do you feel safe going back to the place where you live?: Yes      ?Need for Family Participation in Patient Care: Yes (Comment) ?Care giver support system in place?: Yes (comment) ?Current home services: DME (Patient has rolling walkers,  wheelchair in the home.) ?Criminal Activity/Legal Involvement Pertinent to Current Situation/Hospitalization: No - Comment as needed ? ?Activities of Daily Living ?Home Assistive Devices/Equipment: None ?ADL Screening (condition at time of admission) ?Patient's cognitive ability adequate to safely complete daily activities?: Yes ?Is the patient deaf or have difficulty hearing?: No ?Does the patient have difficulty seeing, even when wearing glasses/contacts?: No ?Does the patient have difficulty concentrating, remembering, or making decisions?: No ?Patient able to express need for assistance with ADLs?: Yes ?Does the patient have difficulty dressing or bathing?: No ?Independently performs ADLs?: Yes (appropriate for developmental age) ?Does the patient have difficulty walking or climbing stairs?: No ?Weakness of Legs: None ?Weakness of Arms/Hands: None ? ?Permission Sought/Granted ?Permission sought to share information with : Family Supports, Customer service manager, Case Manager ?  ?  ? ?Emotional Assessment ?Appearance:: Appears stated age, Well-Groomed ?Attitude/Demeanor/Rapport: Engaged ?Affect (typically observed): Appropriate ?Orientation: : Oriented to Self, Oriented to Place, Oriented to  Time, Oriented to Situation ?Alcohol / Substance Use: Not Applicable ?Psych Involvement: No (comment) ? ?Admission diagnosis:  CAD (coronary artery disease) [I25.10] ?Patient Active Problem List  ? Diagnosis Date Noted  ? S/P CABG x 4 01/08/2022  ? HFrEF (heart failure with reduced ejection fraction) ([place]) 01/01/2022  ? CAD (coronary artery disease) 01/01/2022  ? Anemia in stage 4 chronic kidney disease ([place]) 12/31/2021  ? Grade I diastolic dysfunction 69/45/0388  ? Allergic rhinitis 11/08/2021  ? Left bundle branch block 11/06/2021  ?  DOE (dyspnea on exertion) 11/06/2021  ? Pedal edema 11/06/2021  ? Other fatigue 11/06/2021  ? Estrogen deficiency 02/26/2021  ? Chronic kidney disease (CKD) stage G4/A1, severely  decreased glomerular filtration rate (GFR) between 15-29 mL/min/1.73 square meter and albuminuria creatinine ratio less than 30 mg/g (HCC) 10/30/2020  ? Dysgeusia 08/30/2020  ? Obesity (BMI 35.0-39.9 without comorbidity) 03/20/2018  ? Hypokalemia 03/15/2018  ? Renal insufficiency, mild 02/18/2011  ? Osteoporosis 02/06/2011  ? Primary hypertension 10/17/2010  ? Hyperlipidemia 10/17/2010  ? ?PCP:  Eugenia Pancoast, FNP ?Pharmacy:   ?[place] [place] [address] ?[place] [address] ?Phone: [phone] Fax: [phone] ? ?[facility] ?[address] [place] [address] ?Phone: [phone] Fax: [phone] ? ?[id number] - [place], [place] [address] [place] [address] ?[place] ?[place] [address] ?Phone: [phone] Fax: [phone] ? ?[facility] ?[address] ?[place] [address] ?Phone: [phone] Fax: [phone] ? ?Readmission Risk Interventions ?   ? View : No data to display.  ?  ?  ?  ? ? ? ?

## 2022-01-10 NOTE — Progress Notes (Signed)
EVENING ROUNDS NOTE : ? ?   ?[address] ?      [place] [address] ?            [phone]   ?              ?3 Days Post-Op ?Procedure(s) (LRB): ?CORONARY ARTERY BYPASS GRAFTING (CABG) X FOUR BYPASSES USING OPEN LEFT INTERNAL MAMMARY ARTERY AND  ENDOSCOPIC RIGHT AND LEFT GREATER SAPHENOUS VEIN HARVEST. (N/A) ?TRANSESOPHAGEAL ECHOCARDIOGRAM (TEE) (N/A) ? ? ?Total Length of Stay:  LOS: 9 days  ?Events:   ?No events ?On dopamine 1 ?Ambulated some today ? ? ? ?BP (!) 114/41   Pulse 86   Temp 98.2 ?F (36.8 ?C)   Resp 16   Ht 5' (1.524 m)   Wt 80.2 kg   SpO2 100%   BMI 34.53 kg/m?  ? ?  ? ?  ? ? sodium chloride    ? sodium chloride    ? DOPamine 1 mcg/kg/min (01/10/22 1200)  ? lactated ringers 20 mL/hr at 01/10/22 1432  ? lactated ringers 20 mL/hr at 01/10/22 1200  ? phenylephrine (NEO-SYNEPHRINE) Adult infusion Stopped (01/10/22 0820)  ? ? ?I/O last 3 completed shifts: ?In: 2131.7 [P.O.:660; I.V.:1292.5; IV Piggyback:179.3] ?Out: 1620 [Urine:1620] ? ? ? ?  Latest Ref Rng & Units 01/10/2022  ?  6:24 AM 01/09/2022  ?  4:02 AM 01/08/2022  ?  3:05 PM  ?CBC  ?WBC 4.0 - 10.5 K/uL 21.0   16.6   17.3    ?Hemoglobin 12.0 - 15.0 g/dL 8.6   8.0   8.8    ?Hematocrit 36.0 - 46.0 % 26.8   24.7   27.2    ?Platelets 150 - 400 K/uL 181   123   134    ? ? ? ?  Latest Ref Rng & Units 01/10/2022  ?  6:24 AM 01/09/2022  ?  4:02 AM 01/08/2022  ?  3:05 PM  ?BMP  ?Glucose 70 - 99 mg/dL 90   101   117    ?BUN 8 - 23 mg/dL '19   22   20    '$ ?Creatinine 0.44 - 1.00 mg/dL 1.46   1.56   1.44    ?Sodium 135 - 145 mmol/L 138   134   137    ?Potassium 3.5 - 5.1 mmol/L 4.1   4.1   4.4    ?Chloride 98 - 111 mmol/L 109   104   106    ?CO2 22 - 32 mmol/L '23   22   21    '$ ?Calcium 8.9 - 10.3 mg/dL 8.4   8.1   8.0    ? ? ?ABG ?   ?Component Value Date/Time  ? PHART 7.376 01/07/2022 2227  ? PCO2ART 39.7 01/07/2022 2227  ? PO2ART 116 (H) 01/07/2022 2227  ? HCO3 23.3 01/07/2022 2227  ? TCO2 24 01/07/2022 2227  ? ACIDBASEDEF 2.0 01/07/2022 2227  ?  O2SAT 98 01/07/2022 2227  ? ? ? ? ? ?Melodie Bouillon, MD ?01/10/2022 4:57 PM ? ? ?

## 2022-01-11 LAB — BASIC METABOLIC PANEL
Anion gap: 7 (ref 5–15)
BUN: 28 mg/dL — ABNORMAL HIGH (ref 8–23)
CO2: 21 mmol/L — ABNORMAL LOW (ref 22–32)
Calcium: 8.3 mg/dL — ABNORMAL LOW (ref 8.9–10.3)
Chloride: 107 mmol/L (ref 98–111)
Creatinine, Ser: 1.84 mg/dL — ABNORMAL HIGH (ref 0.44–1.00)
GFR, Estimated: 27 mL/min — ABNORMAL LOW (ref >60)
Glucose, Bld: 123 mg/dL — ABNORMAL HIGH (ref 70–99)
Potassium: 3.8 mmol/L (ref 3.5–5.1)
Sodium: 135 mmol/L (ref 135–145)

## 2022-01-11 LAB — PREPARE RBC (CROSSMATCH)

## 2022-01-11 LAB — CBC
HCT: 24.1 % — ABNORMAL LOW (ref 36.0–46.0)
Hemoglobin: 7.6 g/dL — ABNORMAL LOW (ref 12.0–15.0)
MCH: 28.9 pg (ref 26.0–34.0)
MCHC: 31.5 g/dL (ref 30.0–36.0)
MCV: 91.6 fL (ref 80.0–100.0)
Platelets: 194 10*3/uL (ref 150–400)
RBC: 2.63 MIL/uL — ABNORMAL LOW (ref 3.87–5.11)
RDW: 15.2 % (ref 11.5–15.5)
WBC: 12.9 10*3/uL — ABNORMAL HIGH (ref 4.0–10.5)
nRBC: 0 % (ref 0.0–0.2)

## 2022-01-11 LAB — URINE CULTURE
Culture: NO GROWTH
Special Requests: NORMAL

## 2022-01-11 MED ORDER — SODIUM CHLORIDE 0.9% FLUSH
3.0000 mL | INTRAVENOUS | Status: DC | PRN
  Administered 2022-01-11: 3 mL via INTRAVENOUS

## 2022-01-11 MED ORDER — ~~LOC~~ CARDIAC SURGERY, PATIENT & FAMILY EDUCATION: Freq: Once | Status: DC

## 2022-01-11 MED ORDER — SODIUM CHLORIDE 0.9 % IV SOLN: 250.0000 mL | INTRAVENOUS | Status: DC | PRN

## 2022-01-11 MED ORDER — SODIUM CHLORIDE 0.9% IV SOLUTION: Freq: Once | INTRAVENOUS | Status: AC

## 2022-01-11 MED ORDER — SODIUM CHLORIDE 0.9% FLUSH
3.0000 mL | Freq: Two times a day (BID) | INTRAVENOUS | Status: DC
  Administered 2022-01-11 – 2022-01-12 (×2): 3 mL via INTRAVENOUS

## 2022-01-11 MED ORDER — ASPIRIN EC 81 MG PO TBEC
81.0000 mg | DELAYED_RELEASE_TABLET | Freq: Every day | ORAL | Status: DC
  Administered 2022-01-12 – 2022-01-15 (×4): 81 mg via ORAL
  Filled 2022-01-11 (×4): qty 1

## 2022-01-11 NOTE — Progress Notes (Signed)
Physical Therapy Treatment ?Patient Details ?Name: Katherine Brooks ?MRN: [id number] ?DOB: 1935/12/31 ?Today's Date: 01/11/2022 ? ? ?History of Present Illness Pt is a 86 y.o. F who is s/p CABG x 4 01/07/2022. Significant PMH: CAD, CKD, LBBB, ischemic cardiomyopathy. ? ?  ?PT Comments  ? ? Pt making great progress with mobility tolerance and balance, progressing to ambulating up to ~360 ft today. Pt also progressing from using RW to no AD, but did benefit from the RW for energy conservation. Pt does display some mild dynamic balance deficits, thus educated her on exercises to progress safely to address it. Educated her on progressing her endurance. Provided sternal precautions handout. Pt reported understanding and no further questions. As pt is appearing to progress quickly back to her baseline, updated d/c recs to no PT follow-up. Acute PT will continue to follow to maximize return to baseline prior to d/c. ?  ?Recommendations for follow up therapy are one component of a multi-disciplinary discharge planning process, led by the attending physician.  Recommendations may be updated based on patient status, additional functional criteria and insurance authorization. ? ?Follow Up Recommendations ? No PT follow up ?  ?  ?Assistance Recommended at Discharge PRN  ?Patient can return home with the following A little help with bathing/dressing/bathroom;Assistance with cooking/housework;Assist for transportation;Help with stairs or ramp for entrance ?  ?Equipment Recommendations ? None recommended by PT  ?  ?Recommendations for Other Services   ? ? ?  ?Precautions / Restrictions Precautions ?Precautions: Sternal ?Precaution Booklet Issued: Yes (comment) ?Restrictions ?Weight Bearing Restrictions: Yes ?Other Position/Activity Restrictions: sternal precautions  ?  ? ?Mobility ? Bed Mobility ?Overal bed mobility: Needs Assistance ?Bed Mobility: Sidelying to Sit, Sit to Sidelying ?  ?Sidelying to sit: Supervision ?  ?  ?Sit to  sidelying: Min guard ?General bed mobility comments: Extra time, especially with raising legs onto bed with return to sidelying from sit (needing several attempts), but pt able to perform all bed mobility aspects while following sternal precautions without assistance. ?  ? ?Transfers ?Overall transfer level: Needs assistance ?Equipment used: Rolling walker (2 wheels) ?Transfers: Sit to/from Stand ?Sit to Stand: Supervision ?  ?  ?  ?  ?  ?General transfer comment: Supervision for safety. Pt forgetting to keep hands on lap/chest with stand > sit 1x, needing a reminder. ?  ? ?Ambulation/Gait ?Ambulation/Gait assistance: Min guard, Supervision ?Gait Distance (Feet): 360 Feet (x2 bouts of ~20 ft > ~360 ft) ?Assistive device: Rolling walker (2 wheels), None ?Gait Pattern/deviations: Step-through pattern, Decreased stride length ?Gait velocity: reduced ?Gait velocity interpretation: <1.8 ft/sec, indicate of risk for recurrent falls ?  ?General Gait Details: Pt with slow but steady gait, initially using RW for stability. Progressed to no UE support while in hallway with pt displaying slow but steady gait still with no LOB. Pt did display and report more fatigue, needing several standing rest breaks, when ambulating without UE support. Educated pt to progressing away from [place] at home and on mobility tolerance progression. ? ? ?Stairs ?  ?  ?  ?  ?  ? ? ?Wheelchair Mobility ?  ? ?Modified Rankin (Stroke Patients Only) ?  ? ? ?  ?Balance Overall balance assessment: Mild deficits observed, not formally tested ?  ?  ?  ?  ?  ?  ?  ?  ?  ?Tandem Stance - Right Leg: 10 (>10 seconds, no LOB, no UE support, eyes open) ?Tandem Stance - Left Leg: 10 (>10 seconds, no LOB,  no UE support, eyes open) ?Rhomberg - Eyes Opened: 10 (>10 seconds, no LOB, no UE support, eyes open) ?  ?High level balance activites: Side stepping, Braiding, Head turns ?High Level Balance Comments: Performed several side and braided stepping, no LOB but  unsteadiness noted. Educated pt on progressing dynamic balance through side, backwards, and braided stepping along with slow partial head turns (with progression as long as safe) as this appears to impact her balance. She verbalized understanding ?  ?  ?  ?  ? ?  ?Cognition Arousal/Alertness: Awake/alert ?Behavior During Therapy: [facility] for tasks assessed/performed ?Overall Cognitive Status: Within Functional Limits for tasks assessed ?  ?  ?  ?  ?  ?  ?  ?  ?  ?  ?  ?  ?  ?  ?  ?  ?General Comments: Pt is compliant with sternal precautions. ?  ?  ? ?  ?Exercises   ? ?  ?General Comments   ?  ?  ? ?Pertinent Vitals/Pain Pain Assessment ?Pain Assessment: No/denies pain  ? ? ?Home Living   ?  ?  ?  ?  ?  ?  ?  ?  ?  ?   ?  ?Prior Function    ?  ?  ?   ? ?PT Goals (current goals can now be found in the care plan section) Acute Rehab PT Goals ?Patient Stated Goal: to improve ?PT Goal Formulation: With patient ?Time For Goal Achievement: 01/23/22 ?Potential to Achieve Goals: Good ?Progress towards PT goals: Progressing toward goals ? ?  ?Frequency ? ? ? Min 3X/week ? ? ? ?  ?PT Plan Discharge plan needs to be updated  ? ? ?Co-evaluation   ?  ?  ?  ?  ? ?  ?AM-PAC PT "6 Clicks" Mobility   ?Outcome Measure ? Help needed turning from your back to your side while in a flat bed without using bedrails?: A Little ?Help needed moving from lying on your back to sitting on the side of a flat bed without using bedrails?: A Little ?Help needed moving to and from a bed to a chair (including a wheelchair)?: A Little ?Help needed standing up from a chair using your arms (e.g., wheelchair or bedside chair)?: A Little ?Help needed to walk in hospital room?: A Little ?Help needed climbing 3-5 steps with a railing? : A Little ?6 Click Score: 18 ? ?  ?End of Session   ?Activity Tolerance: Patient tolerated treatment well ?Patient left: in bed;with call bell/phone within reach ?Nurse Communication: Mobility status ?PT Visit Diagnosis:  Unsteadiness on feet (R26.81);Difficulty in walking, not elsewhere classified (R26.2);Other abnormalities of gait and mobility (R26.89) ?  ? ? ?Time: 7353-2992 ?PT Time Calculation (min) (ACUTE ONLY): 24 min ? ?Charges:  $Gait Training: 8-22 mins ?$Neuromuscular Re-education: 8-22 mins          ?          ? ?Moishe Spice, PT, DPT ?Acute Rehabilitation Services  ?Pager: [phone] ?Office: [phone] ? ? ? ?Maretta Bees Pettis ?01/11/2022, 5:02 PM ? ?

## 2022-01-11 NOTE — Progress Notes (Signed)
4 Days Post-Op Procedure(s) (LRB): ?CORONARY ARTERY BYPASS GRAFTING (CABG) X FOUR BYPASSES USING OPEN LEFT INTERNAL MAMMARY ARTERY AND  ENDOSCOPIC RIGHT AND LEFT GREATER SAPHENOUS VEIN HARVEST. (N/A) ?TRANSESOPHAGEAL ECHOCARDIOGRAM (TEE) (N/A) ?Subjective: ?Feels well. Passing urine ok and had BM this am. Appetite improving. Ambulated yesterday without difficulty. ? ?Objective: ?Vital signs in last 24 hours: ?Temp:  [97.6 ?F (36.4 ?C)-98.9 ?F (37.2 ?C)] 97.9 ?F (36.6 ?C) (05/05 0730) ?Pulse Rate:  [63-93] 78 (05/05 0700) ?Cardiac Rhythm: Normal sinus rhythm (05/05 0400) ?Resp:  [13-22] 17 (05/05 0700) ?BP: (85-128)/(33-63) 108/38 (05/05 0700) ?SpO2:  [88 %-100 %] 95 % (05/05 0700) ?Weight:  [82.5 kg] 82.5 kg (05/05 0500) ? ?Hemodynamic parameters for last 24 hours: ?  ? ?Intake/Output from previous day: ?05/04 0701 - 05/05 0700 ?In: 483.2 [P.O.:240; I.V.:243.2] ?Out: 300 [Urine:300] ?Intake/Output this shift: ?No intake/output data recorded. ? ?General appearance: alert and cooperative ?Neurologic: intact ?Heart: regular rate and rhythm, S1, S2 normal, no murmur ?Lungs: diminished breath sounds bibasilar ?Extremities: edema minimal ?Wound: chest and leg incisions ok. Some serosanguinous drainage from drain sites in legs. ? ?Lab Results: ?Recent Labs  ?  01/09/22 ?0402 01/10/22 ?9371  ?WBC 16.6* 21.0*  ?HGB 8.0* 8.6*  ?HCT 24.7* 26.8*  ?PLT 123* 181  ? ?BMET:  ?Recent Labs  ?  01/09/22 ?0402 01/10/22 ?0624  ?NA 134* 138  ?K 4.1 4.1  ?CL 104 109  ?CO2 22 23  ?GLUCOSE 101* 90  ?BUN 22 19  ?CREATININE 1.56* 1.46*  ?CALCIUM 8.1* 8.4*  ?  ?PT/INR: No results for input(s): LABPROT, INR in the last 72 hours. ?ABG ?   ?Component Value Date/Time  ? PHART 7.376 01/07/2022 2227  ? HCO3 23.3 01/07/2022 2227  ? TCO2 24 01/07/2022 2227  ? ACIDBASEDEF 2.0 01/07/2022 2227  ? O2SAT 98 01/07/2022 2227  ? ?CBG (last 3)  ?Recent Labs  ?  01/09/22 ?6967 01/09/22 ?8938 01/09/22 ?1637  ?GLUCAP 107* 107* 141*  ? ? ?Assessment/Plan: ?S/P  Procedure(s) (LRB): ?CORONARY ARTERY BYPASS GRAFTING (CABG) X FOUR BYPASSES USING OPEN LEFT INTERNAL MAMMARY ARTERY AND  ENDOSCOPIC RIGHT AND LEFT GREATER SAPHENOUS VEIN HARVEST. (N/A) ?TRANSESOPHAGEAL ECHOCARDIOGRAM (TEE) (N/A) ? ?POD 4 ?She has been hemodynamically stable although with borderline MAP in the 50's some of the time due to low diastolic pressure. She feels fine with this and ambulating well. I am not sure if this is accurate. Will continue midodrine and expect this to improve. ? ?DC sleeve after checking labs this am. ? ?Continue mobilization. ? ?If labs ok, will transfer to 4E. ? ? LOS: 10 days  ? ? ?Gaye Pollack ?01/11/2022 ? ? ?

## 2022-01-11 NOTE — Progress Notes (Signed)
Patient ID: Katherine Brooks, female   DOB: 12-07-1935, 86 y.o.   MRN: [id number] ?TCTS Evening Rounds: ? ?Hemodynamically stable and BP better after transfusion this afternoon. ? ?She walked 3 times. ? ?Plan transfer to 4E when bed available. ?

## 2022-01-11 NOTE — Progress Notes (Signed)
Occupational Therapy Treatment ?Patient Details ?Name: Katherine Brooks ?MRN: [id number] ?DOB: 04/26/1936 ?Today's Date: 01/11/2022 ? ? ?History of present illness Pt is a 86 y.o. F who is s/p CABG x 4 01/07/2022. Significant PMH: CAD, CKD, LBBB, ischemic cardiomyopathy. ?  ?OT comments ? Patient continues to progress quickly toward all patient focused goals.  Patient is off supplemental O2, her BP is low at times, but expresses no dizziness.  Patient is largely supervision for mobility in the room at [facility] level, but should progress to not needing the RW soon.  She is needing generalized setup for grooming, and Min Guard for lower body ADL from a sit/stand level.  She is needing no real cues for sternal precautions, and demonstrates good safety.  OT can continue efforts in the acute setting, but given family support, no HH OT is anticipated.    ? ?Recommendations for follow up therapy are one component of a multi-disciplinary discharge planning process, led by the attending physician.  Recommendations may be updated based on patient status, additional functional criteria and insurance authorization. ?   ?Follow Up Recommendations ? No OT follow up  ?  ?Assistance Recommended at Discharge Set up Supervision/Assistance  ?Patient can return home with the following ? Assistance with cooking/housework;Assist for transportation;Help with stairs or ramp for entrance ?  ?Equipment Recommendations ? Tub/shower seat  ?  ?Recommendations for Other Services   ? ?  ?Precautions / Restrictions Precautions ?Precautions: Sternal ?Restrictions ?Weight Bearing Restrictions: Yes ?Other Position/Activity Restrictions: pushing through B thighs for sit to stand.  ? ? ?  ? ?Mobility Bed Mobility ?  ?  ?  ?  ?  ?  ?  ?General bed mobility comments: up in the recliner ?  ? ?Transfers ?Overall transfer level: Needs assistance ?Equipment used: Rolling walker (2 wheels) ?Transfers: Sit to/from Stand ?Sit to Stand: Supervision ?  ?  ?  ?  ?  ?  ?  ?   ?Balance Overall balance assessment: Needs assistance ?Sitting-balance support: Feet supported ?Sitting balance-Leahy Scale: Good ?  ?  ?Standing balance support: Bilateral upper extremity supported, During functional activity ?Standing balance-Leahy Scale: Fair ?  ?  ?  ?  ?  ?  ?  ?  ?  ?  ?  ?  ?   ? ?ADL either performed or assessed with clinical judgement  ? ?ADL   ?  ?  ?Grooming: Set up;Standing ?  ?  ?  ?  ?  ?Upper Body Dressing : Supervision/safety;Standing ?  ?Lower Body Dressing: Min guard;Sit to/from stand ?  ?Toilet Transfer: Supervision/safety;Rolling walker (2 wheels);Ambulation ?  ?  ?  ?  ?  ?  ?  ?  ? ?Extremity/Trunk Assessment Upper Extremity Assessment ?Upper Extremity Assessment: Overall WFL for tasks assessed ?  ?Lower Extremity Assessment ?Lower Extremity Assessment: Defer to PT evaluation ?  ?Cervical / Trunk Assessment ?Cervical / Trunk Assessment: Kyphotic ?  ? ?Vision   ?  ?  ?Perception   ?  ?Praxis   ?  ? ?Cognition Arousal/Alertness: Awake/alert ?Behavior During Therapy: [facility] for tasks assessed/performed ?Overall Cognitive Status: Within Functional Limits for tasks assessed ?  ?  ?  ?  ?  ?  ?  ?  ?  ?  ?  ?  ?  ?  ?  ?  ?  ?  ?  ?   ?   ? ?  ?   ? ? ?  ?General Comments  BP  115/43 at the start, BP 145/77 at the conclusion.    ? ? ?Pertinent Vitals/ Pain       Pain Assessment ?Pain Assessment: No/denies pain ?Pain Intervention(s): Monitored during session ? ?   ?  ?  ?  ?  ?  ?  ?  ?  ?  ?  ?  ?  ?  ?  ?  ?  ?  ?  ? ?  ?    ?  ?  ?  ?   ? ?Frequency ? Min 2X/week  ? ? ? ? ?  ?Progress Toward Goals ? ?OT Goals(current goals can now be found in the care plan section) ? Progress towards OT goals: Progressing toward goals ? ?Acute Rehab OT Goals ?OT Goal Formulation: With patient ?Time For Goal Achievement: 01/23/22 ?Potential to Achieve Goals: Good  ?Plan Discharge plan remains appropriate   ? ?Co-evaluation ? ? ?   ?  ?  ?  ?  ? ?  ?AM-PAC OT "6 Clicks" Daily Activity     ?Outcome  Measure ? ? Help from another person eating meals?: None ?Help from another person taking care of personal grooming?: None ?Help from another person toileting, which includes using toliet, bedpan, or urinal?: A Little ?Help from another person bathing (including washing, rinsing, drying)?: A Little ?Help from another person to put on and taking off regular upper body clothing?: A Little ?Help from another person to put on and taking off regular lower body clothing?: A Little ?6 Click Score: 20 ? ?  ?End of Session Equipment Utilized During Treatment: Rolling walker (2 wheels) ? ?OT Visit Diagnosis: Unsteadiness on feet (R26.81);Muscle weakness (generalized) (M62.81) ?  ?Activity Tolerance Patient tolerated treatment well ?  ?Patient Left in chair;with call bell/phone within reach;with nursing/sitter in room ?  ?Nurse Communication Mobility status ?  ? ?   ? ?Time: 6283-1517 ?OT Time Calculation (min): 15 min ? ?Charges: OT General Charges ?$OT Visit: 1 Visit ?OT Treatments ?$Self Care/Home Management : 8-22 mins ? ?01/11/2022 ? ?RP, OTR/L ? ?Acute Rehabilitation Services ? ?Office:  [phone] ? ? ?Ayde Record D Priyal Musquiz ?01/11/2022, 11:27 AM ? ? ?

## 2022-01-12 DIAGNOSIS — Z951 Presence of aortocoronary bypass graft: Secondary | ICD-10-CM

## 2022-01-12 LAB — BASIC METABOLIC PANEL
Anion gap: 5 (ref 5–15)
BUN: 27 mg/dL — ABNORMAL HIGH (ref 8–23)
CO2: 20 mmol/L — ABNORMAL LOW (ref 22–32)
Calcium: 8.3 mg/dL — ABNORMAL LOW (ref 8.9–10.3)
Chloride: 110 mmol/L (ref 98–111)
Creatinine, Ser: 1.75 mg/dL — ABNORMAL HIGH (ref 0.44–1.00)
GFR, Estimated: 28 mL/min — ABNORMAL LOW (ref >60)
Glucose, Bld: 88 mg/dL (ref 70–99)
Potassium: 3.8 mmol/L (ref 3.5–5.1)
Sodium: 135 mmol/L (ref 135–145)

## 2022-01-12 LAB — CBC
HCT: 25.8 % — ABNORMAL LOW (ref 36.0–46.0)
Hemoglobin: 8.5 g/dL — ABNORMAL LOW (ref 12.0–15.0)
MCH: 29.7 pg (ref 26.0–34.0)
MCHC: 32.9 g/dL (ref 30.0–36.0)
MCV: 90.2 fL (ref 80.0–100.0)
Platelets: 207 10*3/uL (ref 150–400)
RBC: 2.86 MIL/uL — ABNORMAL LOW (ref 3.87–5.11)
RDW: 14.9 % (ref 11.5–15.5)
WBC: 12 10*3/uL — ABNORMAL HIGH (ref 4.0–10.5)
nRBC: 0 % (ref 0.0–0.2)

## 2022-01-12 MED ORDER — SODIUM CHLORIDE 0.9 % IV SOLN: 250.0000 mL | INTRAVENOUS | Status: DC | PRN

## 2022-01-12 MED ORDER — CHLORHEXIDINE GLUCONATE CLOTH 2 % EX PADS
6.0000 | MEDICATED_PAD | Freq: Every day | CUTANEOUS | Status: DC
  Administered 2022-01-12: 6 via TOPICAL

## 2022-01-12 MED ORDER — SODIUM CHLORIDE 0.9% FLUSH: 3.0000 mL | INTRAVENOUS | Status: DC | PRN

## 2022-01-12 MED ORDER — ~~LOC~~ CARDIAC SURGERY, PATIENT & FAMILY EDUCATION: Freq: Once | Status: DC

## 2022-01-12 MED ORDER — SODIUM CHLORIDE 0.9% FLUSH
3.0000 mL | Freq: Two times a day (BID) | INTRAVENOUS | Status: DC
  Administered 2022-01-12 – 2022-01-15 (×6): 3 mL via INTRAVENOUS

## 2022-01-12 NOTE — Progress Notes (Signed)
Patient arrived from [facility] to 2E25, Chg done, patient placed on monitor and vital signs obtained and CCMD made aware call bell with in reach. Kieran Arreguin, Bettina Gavia ?RN  ?

## 2022-01-12 NOTE — Progress Notes (Signed)
5 Days Post-Op Procedure(s) (LRB): ?CORONARY ARTERY BYPASS GRAFTING (CABG) X FOUR BYPASSES USING OPEN LEFT INTERNAL MAMMARY ARTERY AND  ENDOSCOPIC RIGHT AND LEFT GREATER SAPHENOUS VEIN HARVEST. (N/A) ?TRANSESOPHAGEAL ECHOCARDIOGRAM (TEE) (N/A) ?Subjective: ?No complaints. Feels well. Slept better ? ?Objective: ?Vital signs in last 24 hours: ?Temp:  [97.8 ?F (36.6 ?C)-99 ?F (37.2 ?C)] 97.8 ?F (36.6 ?C) (05/06 0035) ?Pulse Rate:  [66-106] 73 (05/06 0600) ?Resp:  [13-22] 16 (05/06 0600) ?BP: (91-138)/(38-84) 129/50 (05/06 0600) ?SpO2:  [91 %-100 %] 96 % (05/06 0600) ?Weight:  [82.2 kg] 82.2 kg (05/06 0500) ? ?Hemodynamic parameters for last 24 hours: ?  ? ?Intake/Output from previous day: ?05/05 0701 - 05/06 0700 ?In: 816.7 [P.O.:480; Blood:336.7] ?Out: -  ?Intake/Output this shift: ?No intake/output data recorded. ? ?General appearance: alert and cooperative ?Neurologic: intact ?Heart: regular rate and rhythm, S1, S2 normal, no murmur ?Lungs: clear to auscultation bilaterally ?Extremities: edema mild in ankles ?Wound: incision ok ? ?Lab Results: ?Recent Labs  ?  01/11/22 ?3790 01/12/22 ?0120  ?WBC 12.9* 12.0*  ?HGB 7.6* 8.5*  ?HCT 24.1* 25.8*  ?PLT 194 207  ? ?BMET:  ?Recent Labs  ?  01/11/22 ?2409 01/12/22 ?0120  ?NA 135 135  ?K 3.8 3.8  ?CL 107 110  ?CO2 21* 20*  ?GLUCOSE 123* 88  ?BUN 28* 27*  ?CREATININE 1.84* 1.75*  ?CALCIUM 8.3* 8.3*  ?  ?PT/INR: No results for input(s): LABPROT, INR in the last 72 hours. ?ABG ?   ?Component Value Date/Time  ? PHART 7.376 01/07/2022 2227  ? HCO3 23.3 01/07/2022 2227  ? TCO2 24 01/07/2022 2227  ? ACIDBASEDEF 2.0 01/07/2022 2227  ? O2SAT 98 01/07/2022 2227  ? ?CBG (last 3)  ?Recent Labs  ?  01/09/22 ?1637  ?GLUCAP 141*  ? ? ?Assessment/Plan: ?S/P Procedure(s) (LRB): ?CORONARY ARTERY BYPASS GRAFTING (CABG) X FOUR BYPASSES USING OPEN LEFT INTERNAL MAMMARY ARTERY AND  ENDOSCOPIC RIGHT AND LEFT GREATER SAPHENOUS VEIN HARVEST. (N/A) ?TRANSESOPHAGEAL ECHOCARDIOGRAM (TEE) (N/A) ? ?POD  5 ?Hemodynamically stable in sinus rhythm off neo. BP seems better after transfusion yesterday. Still on midodrine 10 tid. Will continue at this dose for now. ? ?Stage 3 CKD. Creat improved slightly from yesterday. Baseline is 1.6-1.7 preop. ? ?Volume excess: Wt is stable but about 7 lbs over preop. Will see what creat is tomorrow before starting diuresis since she is still midodrine dependent. ? ?DC pacing wires. ? ?Awaiting bed on 4E. Continue IS, ambulation. ? ? LOS: 11 days  ? ? ?Katherine Brooks ?01/12/2022 ? ? ?

## 2022-01-13 LAB — CBC
HCT: 26.3 % — ABNORMAL LOW (ref 36.0–46.0)
Hemoglobin: 8.7 g/dL — ABNORMAL LOW (ref 12.0–15.0)
MCH: 29.5 pg (ref 26.0–34.0)
MCHC: 33.1 g/dL (ref 30.0–36.0)
MCV: 89.2 fL (ref 80.0–100.0)
Platelets: 222 10*3/uL (ref 150–400)
RBC: 2.95 MIL/uL — ABNORMAL LOW (ref 3.87–5.11)
RDW: 15.1 % (ref 11.5–15.5)
WBC: 12.8 10*3/uL — ABNORMAL HIGH (ref 4.0–10.5)
nRBC: 0 % (ref 0.0–0.2)

## 2022-01-13 LAB — BPAM RBC
Blood Product Expiration Date: 202306052359
ISSUE DATE / TIME: 202305051640
Unit Type and Rh: 6200

## 2022-01-13 LAB — BASIC METABOLIC PANEL
Anion gap: 7 (ref 5–15)
BUN: 21 mg/dL (ref 8–23)
CO2: 21 mmol/L — ABNORMAL LOW (ref 22–32)
Calcium: 8.7 mg/dL — ABNORMAL LOW (ref 8.9–10.3)
Chloride: 112 mmol/L — ABNORMAL HIGH (ref 98–111)
Creatinine, Ser: 1.45 mg/dL — ABNORMAL HIGH (ref 0.44–1.00)
GFR, Estimated: 36 mL/min — ABNORMAL LOW (ref >60)
Glucose, Bld: 85 mg/dL (ref 70–99)
Potassium: 4 mmol/L (ref 3.5–5.1)
Sodium: 140 mmol/L (ref 135–145)

## 2022-01-13 LAB — TYPE AND SCREEN
ABO/RH(D): A POS
Antibody Screen: NEGATIVE
Unit division: 0

## 2022-01-13 MED ORDER — POTASSIUM CHLORIDE CRYS ER 20 MEQ PO TBCR
20.0000 meq | EXTENDED_RELEASE_TABLET | Freq: Every day | ORAL | Status: DC
  Administered 2022-01-13 – 2022-01-15 (×3): 20 meq via ORAL
  Filled 2022-01-13 (×3): qty 1

## 2022-01-13 MED ORDER — MAGIC MOUTHWASH
10.0000 mL | Freq: Three times a day (TID) | ORAL | Status: DC | PRN
Start: 2022-01-13 — End: 2022-01-15
  Administered 2022-01-13: 10 mL via ORAL
  Filled 2022-01-13 (×2): qty 10

## 2022-01-13 MED ORDER — FUROSEMIDE 40 MG PO TABS
40.0000 mg | ORAL_TABLET | Freq: Every day | ORAL | Status: DC
  Administered 2022-01-13 – 2022-01-15 (×3): 40 mg via ORAL
  Filled 2022-01-13 (×3): qty 1

## 2022-01-13 MED ORDER — PHENOL 1.4 % MT LIQD
1.0000 | OROMUCOSAL | Status: DC | PRN
  Filled 2022-01-13: qty 177

## 2022-01-13 NOTE — Progress Notes (Addendum)
Patient ambulated in hallway with Nursing staff 470 feet. Patient with 2 standing rest breaks gait steady. Patient Hr up to 130 while walking. Back in room call bell within reach. Ellawyn Wogan, Bettina Gavia ?RN  ?

## 2022-01-13 NOTE — Progress Notes (Signed)
6 Days Post-Op Procedure(s) (LRB): ?CORONARY ARTERY BYPASS GRAFTING (CABG) X FOUR BYPASSES USING OPEN LEFT INTERNAL MAMMARY ARTERY AND  ENDOSCOPIC RIGHT AND LEFT GREATER SAPHENOUS VEIN HARVEST. (N/A) ?TRANSESOPHAGEAL ECHOCARDIOGRAM (TEE) (N/A) ?Subjective: ?Feels like her lower legs and feet are more swollen today. Ambulating better. ? ?Objective: ?Vital signs in last 24 hours: ?Temp:  [97.8 ?F (36.6 ?C)-98.5 ?F (36.9 ?C)] 98.5 ?F (36.9 ?C) (05/07 5188) ?Pulse Rate:  [65-84] 84 (05/07 0829) ?Cardiac Rhythm: Normal sinus rhythm;Bundle branch block (05/07 0706) ?Resp:  [16-25] 19 (05/07 0829) ?BP: (118-141)/(49-76) 132/52 (05/07 0829) ?SpO2:  [98 %-99 %] 98 % (05/07 0829) ?Weight:  [81.2 kg-83 kg] 81.2 kg (05/07 0500) ? ?Hemodynamic parameters for last 24 hours: ?  ? ?Intake/Output from previous day: ?05/06 0701 - 05/07 0700 ?In: 1520 [P.O.:1520] ?Out: -  ?Intake/Output this shift: ?No intake/output data recorded. ? ?General appearance: alert and cooperative ?Neurologic: intact ?Heart: regular rate and rhythm ?Lungs: clear to auscultation bilaterally ?Extremities: edema mild in ankle and feet ?Wound: incisions healing well. ? ?Lab Results: ?Recent Labs  ?  01/12/22 ?0120 01/13/22 ?0148  ?WBC 12.0* 12.8*  ?HGB 8.5* 8.7*  ?HCT 25.8* 26.3*  ?PLT 207 222  ? ?BMET:  ?Recent Labs  ?  01/12/22 ?0120 01/13/22 ?0148  ?NA 135 140  ?K 3.8 4.0  ?CL 110 112*  ?CO2 20* 21*  ?GLUCOSE 88 85  ?BUN 27* 21  ?CREATININE 1.75* 1.45*  ?CALCIUM 8.3* 8.7*  ?  ?PT/INR: No results for input(s): LABPROT, INR in the last 72 hours. ?ABG ?   ?Component Value Date/Time  ? PHART 7.376 01/07/2022 2227  ? HCO3 23.3 01/07/2022 2227  ? TCO2 24 01/07/2022 2227  ? ACIDBASEDEF 2.0 01/07/2022 2227  ? O2SAT 98 01/07/2022 2227  ? ?CBG (last 3)  ?No results for input(s): GLUCAP in the last 72 hours. ? ?Assessment/Plan: ?S/P Procedure(s) (LRB): ?CORONARY ARTERY BYPASS GRAFTING (CABG) X FOUR BYPASSES USING OPEN LEFT INTERNAL MAMMARY ARTERY AND  ENDOSCOPIC RIGHT  AND LEFT GREATER SAPHENOUS VEIN HARVEST. (N/A) ?TRANSESOPHAGEAL ECHOCARDIOGRAM (TEE) (N/A) ? ?POD 6 ? ?BP is increasing now. Will continue midodrine 10 tid today and if BP remains stable will decrease tomorrow. ? ?Volume excess: wt is 5 lbs over preop. Will start lasix today since BP better. ? ?CKD: creatinine coming down and is below baseline. Will diurese for a couple days. ? ?Continue IS, ambulation. ? ?Anticipate home Tuesday with her daughters. ? ? LOS: 12 days  ? ? ?Gaye Pollack ?01/13/2022 ? ? ?

## 2022-01-14 LAB — CBC
HCT: 28.1 % — ABNORMAL LOW (ref 36.0–46.0)
Hemoglobin: 9.2 g/dL — ABNORMAL LOW (ref 12.0–15.0)
MCH: 29.6 pg (ref 26.0–34.0)
MCHC: 32.7 g/dL (ref 30.0–36.0)
MCV: 90.4 fL (ref 80.0–100.0)
Platelets: 280 10*3/uL (ref 150–400)
RBC: 3.11 MIL/uL — ABNORMAL LOW (ref 3.87–5.11)
RDW: 15.5 % (ref 11.5–15.5)
WBC: 12.1 10*3/uL — ABNORMAL HIGH (ref 4.0–10.5)
nRBC: 0.2 % (ref 0.0–0.2)

## 2022-01-14 LAB — BASIC METABOLIC PANEL
Anion gap: 6 (ref 5–15)
BUN: 21 mg/dL (ref 8–23)
CO2: 22 mmol/L (ref 22–32)
Calcium: 8.8 mg/dL — ABNORMAL LOW (ref 8.9–10.3)
Chloride: 110 mmol/L (ref 98–111)
Creatinine, Ser: 1.48 mg/dL — ABNORMAL HIGH (ref 0.44–1.00)
GFR, Estimated: 35 mL/min — ABNORMAL LOW (ref >60)
Glucose, Bld: 121 mg/dL — ABNORMAL HIGH (ref 70–99)
Potassium: 3.9 mmol/L (ref 3.5–5.1)
Sodium: 138 mmol/L (ref 135–145)

## 2022-01-14 MED ORDER — MIDODRINE HCL 5 MG PO TABS
5.0000 mg | ORAL_TABLET | Freq: Three times a day (TID) | ORAL | Status: DC
Start: 2022-01-14 — End: 2022-01-15
  Administered 2022-01-14 – 2022-01-15 (×4): 5 mg via ORAL
  Filled 2022-01-14 (×4): qty 1

## 2022-01-14 NOTE — Progress Notes (Addendum)
? ?   ?  [address] ?      [place] [address] ?            [phone]   ? ?  ?7 Days Post-Op Procedure(s) (LRB): ?CORONARY ARTERY BYPASS GRAFTING (CABG) X FOUR BYPASSES USING OPEN LEFT INTERNAL MAMMARY ARTERY AND  ENDOSCOPIC RIGHT AND LEFT GREATER SAPHENOUS VEIN HARVEST. (N/A) ?TRANSESOPHAGEAL ECHOCARDIOGRAM (TEE) (N/A) ? ?Subjective: ? ?Patient sitting in up chair,  + ambulation   + BM ? ?Objective: ?Vital signs in last 24 hours: ?Temp:  [97.5 ?F (36.4 ?C)-98.5 ?F (36.9 ?C)] 98.4 ?F (36.9 ?C) (05/08 0420) ?Pulse Rate:  [84-94] 90 (05/08 0420) ?Cardiac Rhythm: Normal sinus rhythm;Bundle branch block (05/08 0706) ?Resp:  [18-20] 18 (05/08 0500) ?BP: (101-140)/(47-69) 101/49 (05/08 0420) ?SpO2:  [96 %-99 %] 96 % (05/08 0420) ?Weight:  [80.4 kg] 80.4 kg (05/08 0500) ? ?Intake/Output from previous day: ?05/07 0701 - 05/08 0700 ?In: 360 [P.O.:360] ?Out: 900 [Urine:900] ? ?General appearance: alert, cooperative, and no distress ?Heart: regular rate and rhythm ?Lungs: clear to auscultation bilaterally ?Abdomen: soft, non-tender; bowel sounds normal; no masses,  no organomegaly ?Extremities: edema trace ?Wound: clean and dry ? ?Lab Results: ?Recent Labs  ?  01/13/22 ?0148 01/14/22 ?4497  ?WBC 12.8* 12.1*  ?HGB 8.7* 9.2*  ?HCT 26.3* 28.1*  ?PLT 222 280  ? ?BMET:  ?Recent Labs  ?  01/13/22 ?0148 01/14/22 ?5300  ?NA 140 138  ?K 4.0 3.9  ?CL 112* 110  ?CO2 21* 22  ?GLUCOSE 85 121*  ?BUN 21 21  ?CREATININE 1.45* 1.48*  ?CALCIUM 8.7* 8.8*  ?  ?PT/INR: No results for input(s): LABPROT, INR in the last 72 hours. ?ABG ?   ?Component Value Date/Time  ? PHART 7.376 01/07/2022 2227  ? HCO3 23.3 01/07/2022 2227  ? TCO2 24 01/07/2022 2227  ? ACIDBASEDEF 2.0 01/07/2022 2227  ? O2SAT 98 01/07/2022 2227  ? ?CBG (last 3)  ?No results for input(s): GLUCAP in the last 72 hours. ? ?Assessment/Plan: ?S/P Procedure(s) (LRB): ?CORONARY ARTERY BYPASS GRAFTING (CABG) X FOUR BYPASSES USING OPEN LEFT INTERNAL MAMMARY ARTERY AND   ENDOSCOPIC RIGHT AND LEFT GREATER SAPHENOUS VEIN HARVEST. (N/A) ?TRANSESOPHAGEAL ECHOCARDIOGRAM (TEE) (N/A) ? ?CV- NSR with PVC- BP improving on Midodrine, will decrease to 5 mg TID ?Pulm- no acute issues, off oxygen ?Renal- H/O CKD Stage 3, creatinine stable at 1.48, diuresing on lasix, weight is trending down ?Deconditioning- mild, patient refused H/H, no DME needs ?DIspo- patient stable, will decrease Midodrine today if BP remains stable, will plan to d/c in AM ? ? LOS: 13 days  ?Ellwood Handler, PA-C ?01/14/2022 ? ? Chart reviewed, patient examined, agree with above. ?She feels well and ambulating well. BP increasing. Labs ok. Wt down from yesterday. Will plan to send home on her previous lasix dose. ?

## 2022-01-14 NOTE — Care Management Important Message (Signed)
Important Message ? ?Patient Details  ?Name: Katherine Brooks ?MRN: [id number] ?Date of Birth: 03/08/1936 ? ? ?Medicare Important Message Given:  Yes ? ? ? ? ?Shelda Altes ?01/14/2022, 8:59 AM ?

## 2022-01-14 NOTE — Progress Notes (Signed)
Mobility Specialist: Progress Note ? ? 01/14/22 1609  ?Mobility  ?Activity Refused mobility  ? ?Pt refused mobility stating she has ambulated x3 today. Will f/u as able.  ? ?Katherine Brooks ?Mobility Specialist ?Mobility Specialist [place]: [phone] ?Mobility Specialist [place]: [phone] ? ?

## 2022-01-14 NOTE — Progress Notes (Addendum)
Physical Therapy Treatment ?Patient Details ?Name: Katherine Brooks ?MRN: [id number] ?DOB: April 08, 1936 ?Today's Date: 01/14/2022 ? ? ?History of Present Illness Pt is a 86 y.o. F who is s/p CABG x 4 01/07/2022. Significant PMH: CAD, CKD, LBBB, ischemic cardiomyopathy. ? ?  ?PT Comments  ? ? Pt received in supine, agreeable to therapy session with emphasis on gait/stair training and instruction on HEP exercises. Pt with good tolerance for stair ascent/descent and functional mobility tasks with good awareness of and compliance with sternal precautions. Pt has met bed mobility and transfer goals and given HEP handout to reinforce standing strengthening/balance exercises, pt receptive to all instruction. Pt continues to benefit from PT services to progress toward functional mobility goals, anticipate pt safe to DC home from a PT standpoint once medically cleared, will continue to follow 1-2 more sessions to work on higher level balance and endurance training.  ?Recommendations for follow up therapy are one component of a multi-disciplinary discharge planning process, led by the attending physician.  Recommendations may be updated based on patient status, additional functional criteria and insurance authorization. ? ?Follow Up Recommendations ? No PT follow up ?  ?  ?Assistance Recommended at Discharge PRN  ?Patient can return home with the following A little help with bathing/dressing/bathroom;Assistance with cooking/housework;Assist for transportation ?  ?Equipment Recommendations ? None recommended by PT  ?  ?Recommendations for Other Services   ? ? ?  ?Precautions / Restrictions Precautions ?Precautions: Sternal ?Precaution Booklet Issued:  (previously given by PT) ?Restrictions ?Weight Bearing Restrictions: No ?Other Position/Activity Restrictions: sternal precautions  ?  ? ?Mobility ? Bed Mobility ?Overal bed mobility: Needs Assistance ?Bed Mobility: Sidelying to Sit, Rolling ?Rolling: Modified independent (Device/Increase  time) ?Sidelying to sit: Modified independent (Device/Increase time) ?  ?  ?  ?General bed mobility comments: Extra time, pt able to perform all bed mobility aspects while following sternal precautions without assistance from flat bed without using rails. ?  ? ?Transfers ?Overall transfer level: Needs assistance ?Equipment used: None ?Transfers: Sit to/from Stand ?Sit to Stand: Modified independent (Device/Increase time) ?  ?  ?  ?  ?  ?General transfer comment: from EOB and to chair, no AD, no LOB ?  ? ?Ambulation/Gait ?Ambulation/Gait assistance: Supervision ?Gait Distance (Feet): 125 Feet ?Assistive device: None ?Gait Pattern/deviations: Step-through pattern, Decreased stride length ?Gait velocity: reduced ?  ?  ?General Gait Details: Pt with slow but steady gait, no LOB. Pt did display and report more fatigue due to recent ambulation of unit x2 earlier in day. Cues for self-pacing of activity within tolerance and DOE 2/4 but improves with standing breaks. ? ? ?Stairs ?Stairs: Yes ?Stairs assistance: Supervision ?Stair Management: One rail Left, Alternating pattern, Step to pattern, Forwards ?Number of Stairs: 12 ?General stair comments: cues for activity pacing/safety, pt using L rail ascending and R rail descending, good compliance with sternal precs and no LOB. Pt used reciprocal pattern ascending and step-pattern descending. ? ? ? ? ?  ?Balance Overall balance assessment: Mild deficits observed, not formally tested ?  ?   ?  ? ?  ?Cognition Arousal/Alertness: Awake/alert ?Behavior During Therapy: [facility] for tasks assessed/performed ?Overall Cognitive Status: Within Functional Limits for tasks assessed ?  ?   ?General Comments: Pt is compliant with sternal precautions, fair awareness of safety/deficits. Motivated to progress strength/endurance. ?  ?  ? ?  ?Exercises Other Exercises ?Other Exercises: standing BLE AROM: hip abduction (x5 reps ea), hamstring curls (x10 reps ea), heel raises (x10 reps ea)  cues for  precs and good compliance ?Other Exercises: HEP handout printed and given to pt to progress standing strength/endurance and balance: [email or web address]    Access Code: [email or web address] ?Other Exercises: portion of stairs ascent (see gait section above) and gait billed as TE for BLE strengthening; pt pulling ~1,000 on IS x 5 reps, encouraged hourly use x10 reps ? ?  ?General Comments General comments (skin integrity, edema, etc.): HR 101 bpm standing initially at bedside and up to 125 bpm with exertion; 113 bpm seated in recliner at end of session; SpO2 97% on RA with exertion. no dizziness reported and BP stable pre mobility per chart review, not re-checked as pt asx. DOE 2/4 with exertion, improves with standing rest break. ?  ?  ? ?Pertinent Vitals/Pain Pain Assessment ?Pain Assessment: No/denies pain ?Pain Intervention(s): Monitored during session, Repositioned  ? ? ? ?PT Goals (current goals can now be found in the care plan section) Acute Rehab PT Goals ?Patient Stated Goal: to improve ?PT Goal Formulation: With patient ?Time For Goal Achievement: 01/23/22 ?Progress towards PT goals: Progressing toward goals ? ?  ?Frequency ? ? ? Min 3X/week ? ? ? ?  ?PT Plan Current plan remains appropriate  ? ? ?   ?AM-PAC PT "6 Clicks" Mobility   ?Outcome Measure ? Help needed turning from your back to your side while in a flat bed without using bedrails?: None ?Help needed moving from lying on your back to sitting on the side of a flat bed without using bedrails?: None ?Help needed moving to and from a bed to a chair (including a wheelchair)?: None ?Help needed standing up from a chair using your arms (e.g., wheelchair or bedside chair)?: None ?Help needed to walk in hospital room?: A Little ?Help needed climbing 3-5 steps with a railing? : A Little ?6 Click Score: 22 ? ?  ?End of Session Equipment Utilized During Treatment: Gait belt ?Activity Tolerance: Patient tolerated treatment well ?Patient left: with call bell/phone  within reach;in chair;Other (comment) (clean trash can placed on its side with pillow covering it for BLE elevation as per pt she can't operate recliner within precautions to get up for toileting) ?Nurse Communication: Mobility status ?PT Visit Diagnosis: Unsteadiness on feet (R26.81);Difficulty in walking, not elsewhere classified (R26.2);Other abnormalities of gait and mobility (R26.89) ?  ? ? ?Time: 1147-1202 ?PT Time Calculation (min) (ACUTE ONLY): 15 min ? ?Charges:  $Therapeutic Exercise: 8-22 mins          ?          ? ?Carly P., PTA ?Acute Rehabilitation Services ?Secure Chat Preferred 9a-5:30pm ?Office: [phone]  ? ? ?Carly M Poff ?01/14/2022, 12:26 PM ? ?

## 2022-01-14 NOTE — Progress Notes (Signed)
CARDIAC REHAB PHASE I  ? ?PRE:  Rate/Rhythm: 97 SR LBBB ? ?  BP: sitting 139/64 ? ?  SaO2: 97 RA ? ?MODE:  Ambulation: 430 ft  ? ?POST:  Rate/Rhythm: 133 ST ? ?  BP: sitting 152/60  ? ?  SaO2: 98 RA ? ?Pt able to stand and walk independently. Rest x2 for SOB. HR up to 132 ST with ambulation. No other c/o. Return to recliner. Doesn't like legs elevated as can't get them down. Encouraged elevation when she takes a nap.  ? ?Discussed with pt IS, sternal precautions, exercise, and CRPII. Pt receptive. Thinking about CRPII and will refer to [place]. ?[id number]  ? ?Yves Dill CES, ACSM ?01/14/2022 ?9:00 AM ? ? ? ? ?

## 2022-01-15 ENCOUNTER — Encounter: Payer: Self-pay | Admitting: Surgery

## 2022-01-15 MED ORDER — TRAMADOL HCL 50 MG PO TABS: 50.0000 mg | ORAL_TABLET | Freq: Four times a day (QID) | ORAL | 0 refills | Status: DC | PRN

## 2022-01-15 MED ORDER — MIDODRINE HCL 5 MG PO TABS: 5.0000 mg | ORAL_TABLET | Freq: Three times a day (TID) | ORAL | 0 refills | Status: DC

## 2022-01-15 MED ORDER — ATORVASTATIN CALCIUM 80 MG PO TABS: 80.0000 mg | ORAL_TABLET | Freq: Every day | ORAL | 3 refills | Status: DC

## 2022-01-15 MED ORDER — POTASSIUM CHLORIDE CRYS ER 20 MEQ PO TBCR: 20.0000 meq | EXTENDED_RELEASE_TABLET | Freq: Every day | ORAL | 3 refills | Status: DC

## 2022-01-15 NOTE — Progress Notes (Signed)
Patient given discharge instructions. Daughter present. PIV removed. Chest tube sutures removed per order. Patient tolerated well. Telemetry box removed, CCMD notified. Patient taken to vehicle by wheelchair by staff. ? ?Daymon Larsen, RN  ?

## 2022-01-15 NOTE — TOC Transition Note (Signed)
Transition of Care (TOC) - CM/SW Discharge Note ?Marvetta Gibbons Therapist, sports, BSN ?Transitions of Care ?Unit 4E- RN Case Manager ?See Treatment Team for direct phone #  ? ? ?Patient Details  ?Name: Katherine Brooks ?MRN: [id number] ?Date of Birth: 1935-09-16 ? ?Transition of Care (TOC) CM/SW Contact:  ?Dahlia Client, Romeo Rabon, RN ?Phone Number: ?01/15/2022, 9:58 AM ? ? ?Clinical Narrative:    ?Pt stable for transition home today, family to provide transportation home. Pt has been weaned off oxygen and will not need 02 for home. Per previous CM note- pt has needed DME at home, refusing [facility] services. No further TOC needs noted.  ? ? ?Final next level of care: Home/Self Care ?Barriers to Discharge: Barriers Resolved ? ? ?Patient Goals and CMS Choice ?Patient states their goals for this hospitalization and ongoing recovery are:: patient wants to return home. ?  ?Choice offered to / list presented to : NA ? ?Discharge Placement ?  ?           ?  ? Home ?  ?  ? ?Discharge Plan and Services ?In-house Referral: NA ?Discharge Planning Services: CM Consult ?           ?DME Arranged: N/A ?DME Agency: NA ?  ?  ?  ?HH Arranged: Refused HH ?[place] Agency: NA ?  ?  ?  ? ?Social Determinants of Health (SDOH) Interventions ?  ? ? ?Readmission Risk Interventions ? ?  01/15/2022  ?  9:58 AM  ?Readmission Risk Prevention Plan  ?Transportation Screening Complete  ?PCP or Specialist Appt within 5-7 Days Complete  ?Home Care Screening Complete  ?Medication Review (RN CM) Complete  ? ? ? ? ? ?

## 2022-01-15 NOTE — Progress Notes (Addendum)
? ?   ?  [address] ?      [place] [address] ?            [phone]   ? ?  ?8 Days Post-Op Procedure(s) (LRB): ?CORONARY ARTERY BYPASS GRAFTING (CABG) X FOUR BYPASSES USING OPEN LEFT INTERNAL MAMMARY ARTERY AND  ENDOSCOPIC RIGHT AND LEFT GREATER SAPHENOUS VEIN HARVEST. (N/A) ?TRANSESOPHAGEAL ECHOCARDIOGRAM (TEE) (N/A) ? ?Subjective: ? ?Patient has no complaints.  Happy to be going home.  + ambulation ? ?Objective: ?Vital signs in last 24 hours: ?Temp:  [97.8 ?F (36.6 ?C)-98.6 ?F (37 ?C)] 98.6 ?F (37 ?C) (05/09 0428) ?Pulse Rate:  [90-96] 96 (05/09 0428) ?Cardiac Rhythm: Normal sinus rhythm (05/08 2030) ?Resp:  [18-26] 18 (05/09 0428) ?BP: (113-139)/(41-66) 127/59 (05/09 0428) ?SpO2:  [96 %-99 %] 98 % (05/09 0428) ?Weight:  [80.2 kg] 80.2 kg (05/09 0428) ? ?Intake/Output from previous day: ?05/08 0701 - 05/09 0700 ?In: 240 [P.O.:240] ?Out: 800 [Urine:800] ? ?General appearance: alert, cooperative, and no distress ?Heart: regular rate and rhythm ?Lungs: clear to auscultation bilaterally ?Abdomen: soft, non-tender; bowel sounds normal; no masses,  no organomegaly ?Extremities: edema trace ?Wound: clean and dry, ecchymosis BLE ? ?Lab Results: ?Recent Labs  ?  01/13/22 ?0148 01/14/22 ?6256  ?WBC 12.8* 12.1*  ?HGB 8.7* 9.2*  ?HCT 26.3* 28.1*  ?PLT 222 280  ? ?BMET:  ?Recent Labs  ?  01/13/22 ?0148 01/14/22 ?3893  ?NA 140 138  ?K 4.0 3.9  ?CL 112* 110  ?CO2 21* 22  ?GLUCOSE 85 121*  ?BUN 21 21  ?CREATININE 1.45* 1.48*  ?CALCIUM 8.7* 8.8*  ?  ?PT/INR: No results for input(s): LABPROT, INR in the last 72 hours. ?ABG ?   ?Component Value Date/Time  ? PHART 7.376 01/07/2022 2227  ? HCO3 23.3 01/07/2022 2227  ? TCO2 24 01/07/2022 2227  ? ACIDBASEDEF 2.0 01/07/2022 2227  ? O2SAT 98 01/07/2022 2227  ? ?CBG (last 3)  ?No results for input(s): GLUCAP in the last 72 hours. ? ?Assessment/Plan: ?S/P Procedure(s) (LRB): ?CORONARY ARTERY BYPASS GRAFTING (CABG) X FOUR BYPASSES USING OPEN LEFT INTERNAL MAMMARY ARTERY  AND  ENDOSCOPIC RIGHT AND LEFT GREATER SAPHENOUS VEIN HARVEST. (N/A) ?TRANSESOPHAGEAL ECHOCARDIOGRAM (TEE) (N/A) ? ?CV- NSR with PVC- BP improving, on Midodrine 5 mg TID, will continue to wean.. can hopefully resume Coreg at post operative follow up ?Pulm- no acute issues, continue IS ?Renal-creatinine remains stable, resume home regimen of Lasix ?Deconditioning- mild, refused H/H ?Dispo- patient stable, will d/c home today ? ? LOS: 14 days  ? ?Ellwood Handler, PA-C ?01/15/2022 ? ? Chart reviewed, patient examined, agree with above. ? ?

## 2022-01-15 NOTE — Progress Notes (Signed)
CARDIAC REHAB PHASE I  ? ?D/c education reinforced with pt. Reviewed site care, restrictions, and exercise guidelines. Pt denies further questions or concerns at this time. Referred to CRP II [place]. ? ?[phone] ?Rufina Falco, RN BSN ?01/15/2022 ?8:19 AM ? ?

## 2022-01-15 NOTE — Progress Notes (Signed)
Occupational Therapy Treatment ?Patient Details ?Name: Katherine Brooks ?MRN: [id number] ?DOB: [birth year] ?Today's Date: 01/15/2022 ? ? ?History of present illness Pt is a 86 y.o. F who is s/p CABG x 4 01/07/2022. Significant PMH: CAD, CKD, LBBB, ischemic cardiomyopathy. ?  ?OT comments ? Pt has made excellent progress over the week and now is mod I for all adls bathing self, dressing self, toileting, transferring in and out of shower. Pt is going home with assist from daughter and has 4 dogs at home. Reviewed safety precautions to avoid falls around dogs as well as reviewed sternal precautions and feel pt is safe for dc.  No follow up OT needed.   ? ?Recommendations for follow up therapy are one component of a multi-disciplinary discharge planning process, led by the attending physician.  Recommendations may be updated based on patient status, additional functional criteria and insurance authorization. ?   ?Follow Up Recommendations ? No OT follow up  ?  ?Assistance Recommended at Discharge PRN  ?Patient can return home with the following ? Assistance with cooking/housework ?  ?Equipment Recommendations ?    ?  ?Recommendations for Other Services   ? ?  ?Precautions / Restrictions Precautions ?Precautions: Sternal ?Restrictions ?Weight Bearing Restrictions: Yes ?RUE Weight Bearing: Partial weight bearing ?RUE Partial Weight Bearing Percentage or Pounds: 10 lbs ?LUE Weight Bearing: Partial weight bearing ?LUE Partial Weight Bearing Percentage or Pounds: 10 lbs ?Other Position/Activity Restrictions: sternal precautions  ? ? ?  ? ?Mobility Bed Mobility ?Overal bed mobility: Modified Independent ?  ?  ?  ?  ?  ?  ?General bed mobility comments: Extra time, pt able to perform all bed mobility aspects while following sternal precautions without assistance from flat bed without using rails. ?  ? ?Transfers ?Overall transfer level: Modified independent ?Equipment used: None ?Transfers: Sit to/from Stand ?Sit to Stand: Modified  independent (Device/Increase time) ?Stand pivot transfers: Modified independent (Device/Increase time) ?  ?  ?  ?  ?General transfer comment: from EOB and to chair, no AD, no LOB ?  ?  ?Balance Overall balance assessment: Mild deficits observed, not formally tested ?Sitting-balance support: Feet supported ?Sitting balance-Leahy Scale: Good ?  ?  ?Standing balance support: Bilateral upper extremity supported, During functional activity ?Standing balance-Leahy Scale: Good ?  ?  ?  ?  ?  ?  ?  ?  ?  ?  ?  ?  ?   ? ?ADL either performed or assessed with clinical judgement  ? ?ADL Overall ADL's : Modified independent ?  ?  ?  ?  ?  ?  ?  ?  ?  ?  ?  ?  ?  ?  ?  ?  ?  ?  ?  ?General ADL Comments: Pt now mod I with all adls. Pt fully bathed self, dressed self, toileted and transferred in and out of shower this am.  Pt has daughter with her for next forseeable future to assist. Pt has 4 dogs at home. Spoke with pt about safety around these dogs to avoid falls. ?  ? ?Extremity/Trunk Assessment Upper Extremity Assessment ?Upper Extremity Assessment: Overall WFL for tasks assessed ?  ?Lower Extremity Assessment ?Lower Extremity Assessment: Defer to PT evaluation ?  ?  ?  ? ?Vision   ?Vision Assessment?: No apparent visual deficits ?Additional Comments: pt wears glasses ?  ?Perception Perception ?Perception: Within Functional Limits ?  ?Praxis Praxis ?Praxis: Intact ?  ? ?Cognition Arousal/Alertness: Awake/alert ?Behavior During Therapy:  WFL for tasks assessed/performed ?Overall Cognitive Status: Within Functional Limits for tasks assessed ?  ?  ?  ?  ?  ?  ?  ?  ?  ?  ?  ?  ?  ?  ?  ?  ?General Comments: Pt fully aware of sternal precautions ?  ?  ?   ?Exercises   ? ?  ?Shoulder Instructions   ? ? ?  ?General Comments Pt has progressed well and met all adls goals.  ? ? ?Pertinent Vitals/ Pain       Pain Assessment ?Pain Assessment: No/denies pain ? ?Home Living   ?  ?  ?  ?  ?  ?  ?  ?  ?  ?  ?  ?  ?  ?  ?  ?  ?  ?  ? ?   ?Prior Functioning/Environment    ?  ?  ?  ?   ? ?Frequency ? Min 2X/week  ? ? ? ? ?  ?Progress Toward Goals ? ?OT Goals(current goals can now be found in the care plan section) ? Progress towards OT goals: Goals met/education completed, patient discharged from OT ? ?Acute Rehab OT Goals ?Patient Stated Goal: to go home ?OT Goal Formulation: With patient ?Time For Goal Achievement: 01/23/22 ?Potential to Achieve Goals: Good ?ADL Goals ?Pt Will Perform Grooming: with supervision;standing ?Pt Will Perform Upper Body Dressing: with supervision;sitting ?Pt Will Perform Lower Body Dressing: with supervision;sit to/from stand ?Pt Will Transfer to Toilet: with modified independence;ambulating;regular height toilet  ?Plan Discharge plan remains appropriate   ? ?Co-evaluation ? ? ?   ?  ?  ?  ?  ? ?  ?AM-PAC OT "6 Clicks" Daily Activity     ?Outcome Measure ? ? Help from another person eating meals?: None ?Help from another person taking care of personal grooming?: None ?Help from another person toileting, which includes using toliet, bedpan, or urinal?: None ?Help from another person bathing (including washing, rinsing, drying)?: None ?Help from another person to put on and taking off regular upper body clothing?: None ?Help from another person to put on and taking off regular lower body clothing?: None ?6 Click Score: 24 ? ?  ?End of Session   ? ?OT Visit Diagnosis: Unsteadiness on feet (R26.81);Muscle weakness (generalized) (M62.81) ?  ?Activity Tolerance Patient tolerated treatment well ?  ?Patient Left in chair;with family/visitor present ?  ?Nurse Communication Mobility status ?  ? ?   ? ?Time: 3704-8889 ?OT Time Calculation (min): 8 min ? ?Charges: OT General Charges ?$OT Visit: 1 Visit ?OT Treatments ?$Self Care/Home Management : 8-22 mins ? ? ?Glenford Peers ?01/15/2022, 10:46 AM ?

## 2022-01-15 NOTE — Plan of Care (Signed)
All goals met. 

## 2022-01-16 ENCOUNTER — Other Ambulatory Visit: Payer: Self-pay | Admitting: *Deleted

## 2022-01-16 MED ORDER — MAGIC MOUTHWASH
10.0000 mL | Freq: Three times a day (TID) | ORAL | 0 refills | Status: DC | PRN
Start: 2022-01-16 — End: 2022-01-16

## 2022-01-16 MED ORDER — MAGIC MOUTHWASH: 10.0000 mL | Freq: Three times a day (TID) | ORAL | 0 refills | Status: AC | PRN

## 2022-01-18 ENCOUNTER — Telehealth (HOSPITAL_COMMUNITY): Payer: Self-pay

## 2022-01-18 NOTE — Telephone Encounter (Signed)
Pt insurance is active and benefits verified through [facility]. Co-pay $20.00, DED $0.00/$0.00 met, out of pocket $4,000.00/$650.00 met, co-insurance 0%. No pre-authorization required. Passport, 01/18/22 @ 9:42AM, REF#[id number] ?  ?Will contact patient to see if she is interested in the Cardiac Rehab Program. If interested, patient will need to complete follow up appt. Once completed, patient will be contacted for scheduling upon review by the RN Navigator. ?

## 2022-01-18 NOTE — Telephone Encounter (Signed)
Attempted to call patient in regards to Cardiac Rehab - LM on VM 

## 2022-02-01 ENCOUNTER — Encounter: Payer: Self-pay | Admitting: Nurse Practitioner

## 2022-02-01 ENCOUNTER — Other Ambulatory Visit
Admission: RE | Admit: 2022-02-01 | Discharge: 2022-02-01 | Disposition: A | Discharge status: Home / Self Care | Payer: Medicare PPO | Source: Ambulatory Visit | Attending: Nurse Practitioner | Admitting: Nurse Practitioner

## 2022-02-01 ENCOUNTER — Ambulatory Visit: Payer: Medicare PPO | Admitting: Nurse Practitioner

## 2022-02-01 VITALS — BP 135/73 | HR 88 | Ht 60.0 in | Wt 167.0 lb

## 2022-02-01 DIAGNOSIS — I255 Ischemic cardiomyopathy: Secondary | ICD-10-CM

## 2022-02-01 DIAGNOSIS — E785 Hyperlipidemia, unspecified: Secondary | ICD-10-CM | POA: Insufficient documentation

## 2022-02-01 DIAGNOSIS — N183 Chronic kidney disease, stage 3 unspecified: Secondary | ICD-10-CM | POA: Diagnosis not present

## 2022-02-01 DIAGNOSIS — I2510 Atherosclerotic heart disease of native coronary artery without angina pectoris: Secondary | ICD-10-CM | POA: Diagnosis not present

## 2022-02-01 DIAGNOSIS — I5022 Chronic systolic (congestive) heart failure: Secondary | ICD-10-CM | POA: Diagnosis not present

## 2022-02-01 DIAGNOSIS — D649 Anemia, unspecified: Secondary | ICD-10-CM

## 2022-02-01 DIAGNOSIS — I959 Hypotension, unspecified: Secondary | ICD-10-CM | POA: Diagnosis not present

## 2022-02-01 LAB — COMPREHENSIVE METABOLIC PANEL
ALT: 14 U/L (ref 0–44)
AST: 23 U/L (ref 15–41)
Albumin: 3.9 g/dL (ref 3.5–5.0)
Alkaline Phosphatase: 85 U/L (ref 38–126)
Anion gap: 10 (ref 5–15)
BUN: 20 mg/dL (ref 8–23)
CO2: 24 mmol/L (ref 22–32)
Calcium: 9.8 mg/dL (ref 8.9–10.3)
Chloride: 107 mmol/L (ref 98–111)
Creatinine, Ser: 1.62 mg/dL — ABNORMAL HIGH (ref 0.44–1.00)
GFR, Estimated: 31 mL/min — ABNORMAL LOW (ref >60)
Glucose, Bld: 96 mg/dL (ref 70–99)
Potassium: 4.7 mmol/L (ref 3.5–5.1)
Sodium: 141 mmol/L (ref 135–145)
Total Bilirubin: 0.8 mg/dL (ref 0.3–1.2)
Total Protein: 7.8 g/dL (ref 6.5–8.1)

## 2022-02-01 LAB — CBC
HCT: 36.6 % (ref 36.0–46.0)
Hemoglobin: 11 g/dL — ABNORMAL LOW (ref 12.0–15.0)
MCH: 27.7 pg (ref 26.0–34.0)
MCHC: 30.1 g/dL (ref 30.0–36.0)
MCV: 92.2 fL (ref 80.0–100.0)
Platelets: 443 10*3/uL — ABNORMAL HIGH (ref 150–400)
RBC: 3.97 MIL/uL (ref 3.87–5.11)
RDW: 15.1 % (ref 11.5–15.5)
WBC: 11 10*3/uL — ABNORMAL HIGH (ref 4.0–10.5)
nRBC: 0 % (ref 0.0–0.2)

## 2022-02-01 LAB — LIPID PANEL
Cholesterol: 136 mg/dL (ref 0–200)
HDL: 48 mg/dL (ref >40)
LDL Cholesterol: 71 mg/dL (ref 0–99)
Total CHOL/HDL Ratio: 2.8 RATIO
Triglycerides: 86 mg/dL (ref <150)
VLDL: 17 mg/dL (ref 0–40)

## 2022-02-01 NOTE — Progress Notes (Signed)
Office Visit    Patient Name: Katherine Brooks Date of Encounter: 02/01/2022  Primary Care Provider:  Eugenia Pancoast, [place] Primary Cardiologist:  Ida Rogue, MD  Chief Complaint    86 year old female with a history of hypertension, hyperlipidemia, stage III chronic kidney disease, left bundle branch block, and recent finding of ischemic cardiomyopathy/HFrEF, who presents for office follow-up after recent CABG x4.  Past Medical History    Past Medical History:  Diagnosis Date   Allergy    CAD (coronary artery disease)    a. 12/2021 MV: mid-dist ant, antsept ischemia; b. 12/2021 Cath: LM 60ost, 90d, LAD 90ost, 95p, 85m D2 30, LCX 90p, RCA 90p; c. 12/2021 CABG x 4: LIMA->LAD, VG->OM, VG->Diag, VG->RPDA.   [place]    skin   Chronic HFrEF (heart failure with reduced ejection fraction) (HCC)    CKD (chronic kidney disease), stage III ([facility]    COVID-19 virus infection 09/2021   Facial pain    Hyperlipidemia LDL goal <70    Hypertension    Ischemic cardiomyopathy    a. 11/2021 Echo: EF 30-35%, glob HK, GrI DD, nl RV fxn, mildly dil LA, triv MR.   LBBB (left bundle branch block)    Past Surgical History:  Procedure Laterality Date   CORONARY ARTERY BYPASS GRAFT N/A 01/07/2022   Procedure: CORONARY ARTERY BYPASS GRAFTING (CABG) X FOUR BYPASSES USING OPEN LEFT INTERNAL MAMMARY ARTERY AND  ENDOSCOPIC RIGHT AND LEFT GREATER SAPHENOUS VEIN HARVEST.;  Surgeon: BGaye Pollack MD;  [facility]  Service: Open Heart Surgery;  Laterality: N/A;   LEFT HEART CATH AND CORONARY ANGIOGRAPHY Left 01/01/2022   Procedure: LEFT HEART CATH AND CORONARY ANGIOGRAPHY;  Surgeon: ENelva Bush MD;  [facility];  Service: Cardiovascular;  Laterality: Left;   salivary gland N/A 12/2012   TEE WITHOUT CARDIOVERSION N/A 01/07/2022   Procedure: TRANSESOPHAGEAL ECHOCARDIOGRAM (TEE);  Surgeon: BGaye Pollack MD;  [facility]  Service: Open Heart Surgery;  Laterality: N/A;     Allergies  Allergies  Allergen Reactions   Lisinopril Other (See Comments)    "passed out" Other reaction(s): Other (See Comments), Other (See Comments) "passed out" "I passed out"   Carbamazepine Rash    History of Present Illness    86year old female with the above past medical history including hypertension, hyperlipidemia, stage III chronic kidney disease, and left bundle branch block.  She establish care with Dr. GRockey Situin February 2023, in the setting of persistent dyspnea on exertion following COVID infection earlier in the year.  Echo was ordered and showed an EF of 30 to 35% with global hypokinesis, grade 1 diastolic dysfunction, normal RV function, and trivial MR.  She was placed on carvedilol and Entresto and underwent stress testing in December 28, 2021, which showed a large region of mid to distal anterior and anteroseptal ischemia.  Decision was made to pursue diagnostic catheterization, which was performed April 25, and showed severe multivessel CAD including a 90% distal left main/ostial LAD, along with 90% proximal left circumflex and RCA stenoses.  [facility] subsequently underwent CABG x4 on Jan 07, 2022.  She did have some anemia postoperatively with a hemoglobin nadir of 7.7 requiring 1 unit of packed red blood cells and iron therapy.  She was slowly weaned off of pressors and did require initiation of midodrine.  Hypotension limited resumption of guideline directed medical therapy for HFrEF/ischemic cardiomyopathy.  Home  health PT OT was recommended but patient declined.  She was subsequently discharged home on May 9.  Since discharge, she has done exceptionally well.  She has minimal chest wall soreness and denies angina or dyspnea.  Her surgical incisions have been healing well without drainage.  She has been walking some in her yard, as well as doing some light work with the plants in her garden, without symptoms or  limitations.  She is interested in cardiac rehabilitation at [facility] and does plan to participate once cleared.  She denies palpitations, PND, orthopnea, dizziness, syncope, edema, or early satiety.  She has been monitoring her blood pressure and systolics have typically been running in the 1 teens to 120s.  She is currently just taking midodrine 5 mg once a day.  Home Medications    Current Outpatient Medications  Medication Sig Dispense Refill   Ascorbic Acid (VITAMIN C) 1000 MG tablet Take 1,000 mg by mouth.     aspirin EC 81 MG tablet Take 1 tablet (81 mg total) by mouth daily. Swallow whole.     atorvastatin (LIPITOR) 80 MG tablet Take 1 tablet (80 mg total) by mouth daily. 30 tablet 3   Biotin 10000 MCG TABS Take 10,000 mcg by mouth daily.     CALCIUM PO Take 1,000 mg by mouth daily.     carboxymethylcellul-glycerin (REFRESH RELIEVA) 0.5-0.9 % ophthalmic solution Place 1 drop into both eyes 2 (two) times a week.     Cholecalciferol (VITAMIN D3) 125 MCG (5000 UT) TABS Take 5,000 Units by mouth daily.     furosemide (LASIX) 20 MG tablet Take 1 tablet (20 mg total) by mouth daily. 30 tablet 0   MAGNESIUM PO Take 400 mg by mouth daily.     midodrine (PROAMATINE) 5 MG tablet Take 5 mg by mouth daily at 6 (six) AM.     Multiple Vitamins-Minerals (WOMENS 50+ MULTI VITAMIN/MIN PO) Take 1 capsule by mouth daily. ALIVE     Polyethylene Glycol 400 (BLINK TEARS OP) Place 1 drop into both eyes daily.     potassium chloride SA (KLOR-CON M) 20 MEQ tablet Take 1 tablet (20 mEq total) by mouth daily. 30 tablet 3   No current facility-administered medications for this visit.     Review of Systems    Doing well since bypass surgery.  Minimal chest wall tenderness-"like a sunburn."  She denies angina, dyspnea, palpitations, PND, orthopnea, dizziness, syncope, edema, or early satiety.  All other systems reviewed and are otherwise negative except as noted above.   Cardiac Rehabilitation Eligibility  Assessment  The patient is ready to start cardiac rehabilitation pending clearance from the cardiac surgeon.    Physical Exam    VS:  BP 135/73   Pulse 88   Ht 5' (1.524 m)   Wt 167 lb (75.8 kg)   SpO2 98%   BMI 32.61 kg/m  , BMI Body mass index is 32.61 kg/m.     GEN: Well nourished, well developed, in no acute distress. HEENT: normal. Neck: Supple, no JVD, carotid bruits, or masses. Cardiac: RRR, no murmurs, rubs, or gallops. No clubbing, cyanosis, edema.  Radials/PT 2+ and equal bilaterally.  Midsternal, upper abdominal, and bilateral lower extremity incision sites are healing well without erythema or drainage. Respiratory:  Respirations regular and unlabored, clear to auscultation bilaterally. GI: Soft, nontender, nondistended, BS + x 4. MS: no deformity or atrophy. Skin: warm and dry, no rash. Neuro:  Strength and sensation are intact. Psych: Normal affect.  Accessory Clinical Findings    ECG personally reviewed by me today -regular sinus rhythm, 88, left bundle branch block- no acute changes.  Lab Results  Component Value Date   WBC 12.1 (H) 01/14/2022   HGB 9.2 (L) 01/14/2022   HCT 28.1 (L) 01/14/2022   MCV 90.4 01/14/2022   PLT 280 01/14/2022   Lab Results  Component Value Date   CREATININE 1.48 (H) 01/14/2022   BUN 21 01/14/2022   NA 138 01/14/2022   K 3.9 01/14/2022   CL 110 01/14/2022   CO2 22 01/14/2022   Lab Results  Component Value Date   ALT 13 12/31/2021   AST 20 12/31/2021   ALKPHOS 58 12/31/2021   BILITOT 0.7 12/31/2021   Lab Results  Component Value Date   CHOL 165 01/03/2022   HDL 48 01/03/2022   LDLCALC 106 (H) 01/03/2022   TRIG 56 01/03/2022   CHOLHDL 3.4 01/03/2022    Lab Results  Component Value Date   HGBA1C 5.5 01/06/2022    Assessment & Plan    1.  Coronary artery disease: Status post recent diagnostic catheterization revealing multivessel coronary artery disease requiring CABG x4 at [facility].  Postoperative course was  minimally complicated by hypotension requiring transition from vasopressors to oral midodrine, and anemia requiring 1 unit of packed red blood cells.  Since discharge, she has been weaning down on midodrine and blood pressures have been stable.  She has minimal chest wall tenderness-"like a sunburn," and denies dyspnea or angina.  Surgical incisions are healing well without erythema or drainage.  She remains on aspirin and statin therapy.  Guideline directed medical therapy has been limited in the setting of hypotension.  She will discontinue midodrine today and continue to follow blood pressures at home.  If stable, perhaps we can add back low-dose Entresto versus carvedilol.  I will follow-up a CBC, complete metabolic panel, and fasting lipids today.  She has follow-up with CT surgery in early June and is open to participate in cardiac rehab shortly thereafter.  2.  Ischemic cardiomyopathy/chronic HFrEF: Pre-CABG echo in March 2023 showed an EF of 30 to 35%.  She was previously on carvedilol and Entresto however, these were unable to be resumed postoperatively in the setting of hypotension requiring vasopressors and subsequently midodrine.  She has been feeling well without dyspnea or edema.  She is euvolemic on examination today.  Pressures at home have been stable in the 1 teens to 120s, and she is 135/73 today.  I will start by discontinuing midodrine and patient will continue to follow blood pressures at home.  She will contact us next week with some trends and hopefully, we can resume Entresto.  Follow-up complete metabolic panel today.  Plan to follow-up echocardiogram approximately 3 months post revascularization.  3.  Essential hypertension/hypotension: Postoperative hypotension as outlined above.  Pressures have been stable at home and she is 135/73 today.  Discontinue midodrine.  We will consider resumption of Entresto/carvedilol pending pressure trends over the next few weeks.  4.  Hyperlipidemia:  LDL of 106 in April.  She is now on high potency statin therapy.  Continue atorvastatin and follow-up lipids and LFTs.  5.  Stage III chronic kidney disease: Creatinine was stable at 1.48 on the eighth.  Follow-up today.  6.  Normocytic/acute blood loss anemia: This occurred postoperatively and required 1 unit of packed red blood cells.  She has been feeling well.  Follow-up CBC today.  7.  Disposition: Follow-up CBC, complete metabolic  panel, and fasting lipids today.  Follow-up in clinic in 1 month or sooner if necessary.   Murray Hodgkins, NP 02/01/2022, 2:48 PM

## 2022-02-01 NOTE — Patient Instructions (Signed)
Medication Instructions:  No changes at this time.   *If you need a refill on your cardiac medications before your next appointment, please call your pharmacy*   Lab Work: CMET, CBC, Lipid panel today over at the [place] at [facility] then go to 1st desk on the right to check in (REGISTRATION)    If you have labs (blood work) drawn today and your tests are completely normal, you will receive your results only by: [facility] (if you have [place]) OR A paper copy in the mail If you have any lab test that is abnormal or we need to change your treatment, we will call you to review the results.   Testing/Procedures: None   Follow-Up: At [facility], you and your health needs are our priority.  As part of our continuing mission to provide you with exceptional heart care, we have created designated Provider Care Teams.  These Care Teams include your primary Cardiologist (physician) and Advanced Practice Providers (APPs -  Physician Assistants and Nurse Practitioners) who all work together to provide you with the care you need, when you need it.    Your next appointment:   1 month(s)  The format for your next appointment:   In Person  Provider:   Ida Rogue, MD or Murray Hodgkins, NP       Important Information About [place]

## 2022-02-07 ENCOUNTER — Other Ambulatory Visit: Payer: Self-pay

## 2022-02-07 DIAGNOSIS — R600 Localized edema: Secondary | ICD-10-CM

## 2022-02-07 MED ORDER — FUROSEMIDE 20 MG PO TABS: 20.0000 mg | ORAL_TABLET | Freq: Every day | ORAL | 0 refills | Status: DC

## 2022-02-07 MED ORDER — ATORVASTATIN CALCIUM 80 MG PO TABS: 80.0000 mg | ORAL_TABLET | Freq: Every day | ORAL | 1 refills | Status: DC

## 2022-02-07 MED ORDER — POTASSIUM CHLORIDE CRYS ER 20 MEQ PO TBCR: 20.0000 meq | EXTENDED_RELEASE_TABLET | Freq: Every day | ORAL | 1 refills | Status: DC

## 2022-02-13 ENCOUNTER — Other Ambulatory Visit: Payer: Self-pay | Admitting: Surgery

## 2022-02-13 ENCOUNTER — Ambulatory Visit: Payer: Medicare PPO | Admitting: Surgery

## 2022-02-13 DIAGNOSIS — Z951 Presence of aortocoronary bypass graft: Secondary | ICD-10-CM

## 2022-02-13 NOTE — Patient Instructions (Signed)

## 2022-02-13 NOTE — Progress Notes (Signed)
      [address]       [place], [address]             [phone]       HPI: Patient returns for routine postoperative follow-up having undergone CABG x 4 on 01/07/2022. The patient's early postoperative recovery while in the hospital was notable for anemia requiring transfusion of packed cells.  She was hypotensive and required use of Midodrine.  Since hospital discharge the patient reports she feels great.  She states she has been enjoying time outside and working in her garden.  She has not had any issues with her wounds.   Current Outpatient Medications  Medication Sig Dispense Refill   Ascorbic Acid (VITAMIN C) 1000 MG tablet Take 1,000 mg by mouth.     aspirin EC 81 MG tablet Take 1 tablet (81 mg total) by mouth daily. Swallow whole.     atorvastatin (LIPITOR) 80 MG tablet Take 1 tablet (80 mg total) by mouth daily. 90 tablet 1   Biotin 10000 MCG TABS Take 10,000 mcg by mouth daily.     CALCIUM PO Take 1,000 mg by mouth daily.     carboxymethylcellul-glycerin (REFRESH RELIEVA) 0.5-0.9 % ophthalmic solution Place 1 drop into both eyes 2 (two) times a week.     Cholecalciferol (VITAMIN D3) 125 MCG (5000 UT) TABS Take 5,000 Units by mouth daily.     furosemide (LASIX) 20 MG tablet Take 1 tablet (20 mg total) by mouth daily. 90 tablet 0   MAGNESIUM PO Take 400 mg by mouth daily.     midodrine (PROAMATINE) 5 MG tablet Take 5 mg by mouth daily at 6 (six) AM.     Multiple Vitamins-Minerals (WOMENS 50+ MULTI VITAMIN/MIN PO) Take 1 capsule by mouth daily. ALIVE     Polyethylene Glycol 400 (BLINK TEARS OP) Place 1 drop into both eyes daily.     potassium chloride SA (KLOR-CON M) 20 MEQ tablet Take 1 tablet (20 mEq total) by mouth daily. 90 tablet 1   No current facility-administered medications for this visit.    Physical Exam:  BP 128/72   Pulse 95   Resp 20   Ht 5' (1.524 m)   Wt 170 lb (77.1 kg)   SpO2 98% Comment: RA  BMI 33.20 kg/m   Gen: NAD Heart;  RRR Lungs: CTA bilaterally Ext: no edema Incisions: well healed  Diagnostic Tests:  CXR: sternal wires intact, no evidence of pleural effusion  A/P:  S/p CABG x 4- overall doing very well, she feels great Hypotension- resolved, her Midodrine has been stopped CKD Stage 3 Activity- continue to increase ambulation as tolerated, okay to start cardiac rehabilitation, you may resume driving if no longer taking narcotics, sternal precautions for 2 additional weeks Cardiac rehabilitation-okay to start, patient will do this at [facility] as this is closer to her home RTC prn   Ellwood Handler, PA-C [facility] [phone]

## 2022-02-14 ENCOUNTER — Ambulatory Visit
Admission: RE | Admit: 2022-02-14 | Discharge: 2022-02-14 | Disposition: A | Discharge status: Home / Self Care | Payer: Medicare PPO | Source: Ambulatory Visit | Attending: Surgery | Admitting: Surgery

## 2022-02-14 ENCOUNTER — Ambulatory Visit (INDEPENDENT_AMBULATORY_CARE_PROVIDER_SITE_OTHER): Payer: Self-pay | Admitting: Physician Assistant

## 2022-02-14 VITALS — BP 128/72 | HR 95 | Resp 20 | Ht 60.0 in | Wt 170.0 lb

## 2022-02-14 DIAGNOSIS — Z951 Presence of aortocoronary bypass graft: Secondary | ICD-10-CM | POA: Diagnosis not present

## 2022-02-14 DIAGNOSIS — R918 Other nonspecific abnormal finding of lung field: Secondary | ICD-10-CM | POA: Diagnosis not present

## 2022-02-14 DIAGNOSIS — I700 Atherosclerosis of aorta: Secondary | ICD-10-CM | POA: Diagnosis not present

## 2022-02-15 ENCOUNTER — Ambulatory Visit (INDEPENDENT_AMBULATORY_CARE_PROVIDER_SITE_OTHER): Payer: Medicare PPO

## 2022-02-15 VITALS — Ht 60.0 in | Wt 165.0 lb

## 2022-02-15 DIAGNOSIS — Z0000 Encounter for general adult medical examination without abnormal findings: Secondary | ICD-10-CM | POA: Diagnosis not present

## 2022-02-15 NOTE — Progress Notes (Signed)
I connected with Verba Ainley today by telephone and verified that I am speaking with the correct person using two identifiers. Location patient: home Location provider: work Persons participating in the virtual visit: [place], Bold LPN.   I discussed the limitations, risks, security and privacy concerns of performing an evaluation and management service by telephone and the availability of in person appointments. I also discussed with the patient that there may be a patient responsible charge related to this service. The patient expressed understanding and verbally consented to this telephonic visit.    Interactive audio and video telecommunications were attempted between this provider and patient, however failed, due to patient having technical difficulties OR patient did not have access to video capability.  We continued and completed visit with audio only.     Vital signs may be patient reported or missing.  Subjective:   AMEENAH PROSSER is a 86 y.o. female who presents for Medicare Annual (Subsequent) preventive examination.  Review of Systems     Cardiac Risk Factors include: advanced age (>67mn, >>13women);dyslipidemia;hypertension;obesity (BMI >30kg/m2)     Objective:    Today's Vitals   02/15/22 0926  Weight: 165 lb (74.8 kg)  Height: 5' (1.524 m)   Body mass index is 32.22 kg/m.     02/15/2022    9:29 AM 01/01/2022    6:00 PM 01/01/2022    7:41 AM 12/31/2021    9:09 AM 06/12/2021   12:49 PM 11/08/2020    1:11 PM 07/05/2019   11:17 AM  Advanced Directives  Does Patient Have a Medical Advance Directive? Yes Yes Yes Yes Yes Yes Yes  Type of AParamed[place]of [place] will Healthcare Power of [place]anderof [place] will [place] will  Does patient want to make changes to medical advance  directive?  No - Patient declined       Copy of HCotatiin Chart? No - copy requested     No - copy requested No - copy requested    Current Medications (verified) Outpatient Encounter Medications as of 02/15/2022  Medication Sig   Ascorbic Acid (VITAMIN C) 1000 MG tablet Take 1,000 mg by mouth.   aspirin EC 81 MG tablet Take 1 tablet (81 mg total) by mouth daily. Swallow whole.   atorvastatin (LIPITOR) 80 MG tablet Take 1 tablet (80 mg total) by mouth daily.   CALCIUM PO Take 1,000 mg by mouth daily.   carboxymethylcellul-glycerin (REFRESH RELIEVA) 0.5-0.9 % ophthalmic solution Place 1 drop into both eyes 2 (two) times a week.   Cholecalciferol (VITAMIN D3) 125 MCG (5000 UT) TABS Take 5,000 Units by mouth daily.   furosemide (LASIX) 20 MG tablet Take 1 tablet (20 mg total) by mouth daily.   MAGNESIUM PO Take 400 mg by mouth daily.   Polyethylene Glycol 400 (BLINK TEARS OP) Place 1 drop into both eyes daily.   potassium chloride SA (KLOR-CON M) 20 MEQ tablet Take 1 tablet (20 mEq total) by mouth daily.   Biotin 10000 MCG TABS Take 10,000 mcg by mouth daily.   No facility-administered encounter medications on file as of 02/15/2022.    Allergies (verified) Lisinopril and Carbamazepine   History: Past Medical History:  Diagnosis Date   Allergy    CAD (coronary artery disease)    a. 12/2021 MV: mid-dist ant, antsept ischemia; b. 12/2021 Cath: LM 60ost, 90d,  LAD 90ost, 95p, 71m D2 30, LCX 90p, RCA 90p; c. 12/2021 CABG x 4: LIMA->LAD, VG->OM, VG->Diag, VG->RPDA.   Cancer ([place]    skin   Chronic HFrEF (heart failure with reduced ejection fraction) (HCC)    CKD (chronic kidney disease), stage III ([place]    COVID-19 virus infection 09/2021   Facial pain    Hyperlipidemia LDL goal <70    Hypertension    Ischemic cardiomyopathy    a. 11/2021 Echo: EF 30-35%, glob HK, GrI DD, nl RV fxn, mildly dil LA, triv MR.   LBBB (left bundle branch block)    Past Surgical History:   Procedure Laterality Date   CORONARY ARTERY BYPASS GRAFT N/A 01/07/2022   Procedure: CORONARY ARTERY BYPASS GRAFTING (CABG) X FOUR BYPASSES USING OPEN LEFT INTERNAL MAMMARY ARTERY AND  ENDOSCOPIC RIGHT AND LEFT GREATER SAPHENOUS VEIN HARVEST.;  Surgeon: BGaye Pollack MD;  [facility]  Service: Open Heart Surgery;  Laterality: N/A;   LEFT HEART CATH AND CORONARY ANGIOGRAPHY Left 01/01/2022   Procedure: LEFT HEART CATH AND CORONARY ANGIOGRAPHY;  Surgeon: ENelva Bush MD;  [facility];  Service: Cardiovascular;  Laterality: Left;   salivary gland N/A 12/2012   TEE WITHOUT CARDIOVERSION N/A 01/07/2022   Procedure: TRANSESOPHAGEAL ECHOCARDIOGRAM (TEE);  Surgeon: BGaye Pollack MD;  [facility] [facility]  Service: Open Heart Surgery;  Laterality: N/A;   Family History  Problem Relation Age of Onset   Heart disease Mother    Lung disease Father    Cancer Sister        unknown primary   Rheum arthritis Sister    Kidney Stones Sister    COPD Brother    Social History   Socioeconomic History   Marital status: Married    Spouse name: HJoneen Boers  Number of children: 3   Years of education: Not on file   Highest education level: Some college, no degree  Occupational History    Comment: [employer]  Tobacco Use   Smoking status: Never    Passive exposure: Never   Smokeless tobacco: Never  Vaping Use   Vaping Use: Never used  Substance and Sexual Activity   Alcohol use: No   Drug use: No   Sexual activity: Not Currently  Other Topics Concern   Not on file  Social History Narrative   Lives with husband   Social Determinants of Health   Financial Resource Strain: Low Risk  (02/15/2022)   Overall Financial Resource Strain (CARDIA)    Difficulty of Paying Living Expenses: Not hard at all  Food Insecurity: No Food Insecurity (02/15/2022)   Hunger Vital Sign    Worried About Running Out of Food in the Last Year: Never true    Ran Out of Food in the Last  Year: Never true  Transportation Needs: No Transportation Needs (02/15/2022)   PRAPARE - THydrologist(Medical): No    Lack of Transportation (Non-Medical): No  Physical Activity: Sufficiently Active (02/15/2022)   Exercise Vital Sign    Days of Exercise per Week: 7 days    Minutes of Exercise per Session: 150+ min  Stress: No Stress Concern Present (02/15/2022)   [place]   Feeling of Stress : Not at all  Social Connections: Not on file    Tobacco Counseling Counseling given: Not Answered   Clinical Intake:  Pre-visit preparation completed: Yes  Pain : No/denies  pain     Nutritional Status: BMI > 30  Obese Nutritional Risks: None Diabetes: No  How often do you need to have someone help you when you read instructions, pamphlets, or other written materials from your doctor or pharmacy?: 1 - Never What is the last grade level you completed in school?: 52yrcollege  Diabetic? no  Interpreter Needed?: No  Information entered by :: NAllen LPN   Activities of Daily Living    02/15/2022    9:30 AM 02/12/2022    4:00 PM  In your present state of health, do you have any difficulty performing the following activities:  Hearing? 0 0  Vision? 0 0  Difficulty concentrating or making decisions? 0 0  Walking or climbing stairs? 0 0  Dressing or bathing? 0 0  Doing errands, shopping? 0 0  Preparing Food and eating ? N N  Using the Toilet? N N  In the past six months, have you accidently leaked urine? N N  Do you have problems with loss of bowel control? N N  Managing your Medications? N N  Managing your Finances? N N  Housekeeping or managing your Housekeeping? N N    Patient Care Team: DEugenia Pancoast FNP as PCP - General (Family Medicine) GMinna Merritts MD as PCP - Cardiology (Cardiology)  Indicate any recent Medical Services you may have received from other than [facility]  providers in the past year (date may be approximate).     Assessment:   This is a routine wellness examination for Twinkle.  Hearing/Vision screen Vision Screening - Comments:: Regular eye exams, [facility]  Dietary issues and exercise activities discussed: Current Exercise Habits: Home exercise routine, Type of exercise: Other - see comments (gardening), Time (Minutes): > 60, Frequency (Times/Week): 7, Weekly Exercise (Minutes/Week): 0   Goals Addressed             This Visit's Progress    Patient Stated       02/15/2022, no goals       Depression Screen    02/15/2022    9:30 AM 11/08/2020    1:12 PM 07/12/2020    3:55 PM 07/07/2019   10:31 AM 07/05/2019   11:18 AM 07/01/2018    9:41 AM 03/20/2018   11:32 AM  PHQ 2/9 Scores  PHQ - 2 Score 0 0 0 0 0 0 0  PHQ- 9 Score  0 3 0 0      Fall Risk    02/15/2022    9:30 AM 02/12/2022    4:00 PM 11/08/2020    1:11 PM 07/12/2020    3:55 PM 07/07/2019   10:32 AM  Fall Risk   Falls in the past year? 0 0 0 1 0  Number falls in past yr: 0  0 0   Injury with Fall? 0 0 0 0   Risk for fall due to : Medication side effect  Medication side effect    Follow up Falls evaluation completed;Education provided;Falls prevention discussed  Falls evaluation completed;Falls prevention discussed  Falls evaluation completed    FALL RISK PREVENTION PERTAINING TO THE HOME:  Any stairs in or around the home? Yes  If so, are there any without handrails? No  Home free of loose throw rugs in walkways, pet beds, electrical cords, etc? Yes  Adequate lighting in your home to reduce risk of falls? Yes   ASSISTIVE DEVICES UTILIZED TO PREVENT FALLS:  Life alert? No  Use of a cane, walker or  w/c? No  Grab bars in the bathroom? Yes  Shower chair or bench in shower? Yes  Elevated toilet seat or a handicapped toilet? Yes   TIMED UP AND GO:  Was the test performed? No .      Cognitive Function:    11/08/2020    1:13 PM 07/05/2019   11:21 AM  MMSE  - Mini Mental State Exam  Not completed: Refused   Orientation to time  5  Orientation to Place  5  Registration  3  Attention/ Calculation  5  Recall  3  Language- repeat  1        02/15/2022    9:31 AM  6CIT Screen  What Year? 0 points  What month? 0 points  What time? 0 points  Count back from 20 0 points  Months in reverse 0 points  Repeat phrase 0 points  Total Score 0 points    Immunizations Immunization History  Administered Date(s) Administered   Fluad Quad(high Dose 65+) 07/12/2020   Influenza Split 06/25/2017   Influenza, High Dose Seasonal PF 06/13/2016, 05/09/2018   Influenza-Unspecified 07/10/2013, 06/05/2015, 06/13/2016, 07/20/2021   PFIZER(Purple Top)SARS-COV-2 Vaccination 09/21/2019, 10/11/2019   Pneumococcal Conjugate-13 03/01/2016   Pneumococcal Polysaccharide-23 03/20/2018   Td 01/14/2002   Tdap 06/12/2021   Zoster Recombinat (Shingrix) 05/09/2018, 07/15/2018    TDAP status: Up to date  Flu Vaccine status: Up to date  Pneumococcal vaccine status: Up to date  Covid-19 vaccine status: Completed vaccines  Qualifies for Shingles Vaccine? Yes   Zostavax completed Yes   Shingrix Completed?: Yes  Screening Tests Health Maintenance  Topic Date Due   COVID-19 Vaccine (3 - Pfizer series) 12/06/2019   INFLUENZA VACCINE  04/09/2022   TETANUS/TDAP  06/13/2031   Pneumonia Vaccine 92+ Years old  Completed   DEXA SCAN  Completed   Zoster Vaccines- Shingrix  Completed   HPV VACCINES  Aged Out    Health Maintenance  Health Maintenance Due  Topic Date Due   COVID-19 Vaccine (3 - Pfizer series) 12/06/2019    Colorectal cancer screening: No longer required.   Mammogram status: No longer required due to age.  Bone Density status: Completed 07/11/2021.   Lung Cancer Screening: (Low Dose CT Chest recommended if Age 54-80 years, 30 pack-year currently smoking OR have quit w/in 15years.) does not qualify.   Lung Cancer Screening Referral:  no  Additional Screening:  Hepatitis C Screening: does not qualify;   Vision Screening: Recommended annual ophthalmology exams for early detection of glaucoma and other disorders of the eye. Is the patient up to date with their annual eye exam?  Yes  Who is the provider or what is the name of the office in which the patient attends annual eye exams? [facility] If pt is not established with a provider, would they like to be referred to a provider to establish care? No .   Dental Screening: Recommended annual dental exams for proper oral hygiene  Community Resource Referral / Chronic Care Management: CRR required this visit?  No   CCM required this visit?  No      Plan:     I have personally reviewed and noted the following in the patient's chart:   Medical and social history Use of alcohol, tobacco or illicit drugs  Current medications and supplements including opioid prescriptions.  Functional ability and status Nutritional status Physical activity Advanced directives List of other physicians Hospitalizations, surgeries, and ER visits in previous 12 months Vitals Screenings  to include cognitive, depression, and falls Referrals and appointments  In addition, I have reviewed and discussed with patient certain preventive protocols, quality metrics, and best practice recommendations. A written personalized care plan for preventive services as well as general preventive health recommendations were provided to patient.     Kellie Simmering, LPN   09/11/7515   Nurse Notes: none  Due to this being a virtual visit, the after visit summary with patients personalized plan was offered to patient via mail or my-chart. Patient would like to access on my-chart

## 2022-02-15 NOTE — Patient Instructions (Signed)
Katherine Brooks , Thank you for taking time to come for your Medicare Wellness Visit. I appreciate your ongoing commitment to your health goals. Please review the following plan we discussed and let me know if I can assist you in the future.   Screening recommendations/referrals: Colonoscopy: not required Mammogram: not required Bone Density: completed 07/11/2021 Recommended yearly ophthalmology/optometry visit for glaucoma screening and checkup Recommended yearly dental visit for hygiene and checkup  Vaccinations: Influenza vaccine: due 04/09/2022 Pneumococcal vaccine: completed 03/20/2018 Tdap vaccine: completed 06/12/2021, due 06/13/2031 Shingles vaccine: completed    Covid-19: 10/11/2019, 09/21/2019  Advanced directives: Please bring a copy of your POA (Power of Attorney) and/or Living Will to your next appointment.   Conditions/risks identified: none  Next appointment: Follow up in one year for your annual wellness visit    Preventive Care 65 Years and Older, Female Preventive care refers to lifestyle choices and visits with your health care provider that can promote health and wellness. What does preventive care include? A yearly physical exam. This is also called an annual well check. Dental exams once or twice a year. Routine eye exams. Ask your health care provider how often you should have your eyes checked. Personal lifestyle choices, including: Daily care of your teeth and gums. Regular physical activity. Eating a healthy diet. Avoiding tobacco and drug use. Limiting alcohol use. Practicing safe sex. Taking low-dose aspirin every day. Taking vitamin and mineral supplements as recommended by your health care provider. What happens during an annual well check? The services and screenings done by your health care provider during your annual well check will depend on your age, overall health, lifestyle risk factors, and family history of disease. Counseling  Your health care  provider may ask you questions about your: Alcohol use. Tobacco use. Drug use. Emotional well-being. Home and relationship well-being. Sexual activity. Eating habits. History of falls. Memory and ability to understand (cognition). Work and work Statistician. Reproductive health. Screening  You may have the following tests or measurements: Height, weight, and BMI. Blood pressure. Lipid and cholesterol levels. These may be checked every 5 years, or more frequently if you are over 74 years old. Skin check. Lung cancer screening. You may have this screening every year starting at age 32 if you have a 30-pack-year history of smoking and currently smoke or have quit within the past 15 years. Fecal occult blood test (FOBT) of the stool. You may have this test every year starting at age 17. Flexible sigmoidoscopy or colonoscopy. You may have a sigmoidoscopy every 5 years or a colonoscopy every 10 years starting at age 38. Hepatitis C blood test. Hepatitis B blood test. Sexually transmitted disease (STD) testing. Diabetes screening. This is done by checking your blood sugar (glucose) after you have not eaten for a while (fasting). You may have this done every 1-3 years. Bone density scan. This is done to screen for osteoporosis. You may have this done starting at age 66. Mammogram. This may be done every 1-2 years. Talk to your health care provider about how often you should have regular mammograms. Talk with your health care provider about your test results, treatment options, and if necessary, the need for more tests. Vaccines  Your health care provider may recommend certain vaccines, such as: Influenza vaccine. This is recommended every year. Tetanus, diphtheria, and acellular pertussis (Tdap, Td) vaccine. You may need a Td booster every 10 years. Zoster vaccine. You may need this after age 42. Pneumococcal 13-valent conjugate (PCV13) vaccine. One dose  is recommended after age  84. Pneumococcal polysaccharide (PPSV23) vaccine. One dose is recommended after age 7. Talk to your health care provider about which screenings and vaccines you need and how often you need them. This information is not intended to replace advice given to you by your health care provider. Make sure you discuss any questions you have with your health care provider. Document Released: 09/22/2015 Document Revised: 05/15/2016 Document Reviewed: 06/27/2015 Elsevier Interactive Patient Education  2017 [place] Prevention in the Home Falls can cause injuries. They can happen to people of all ages. There are many things you can do to make your home safe and to help prevent falls. What can I do on the outside of my home? Regularly fix the edges of walkways and driveways and fix any cracks. Remove anything that might make you trip as you walk through a door, such as a raised step or threshold. Trim any bushes or trees on the path to your home. Use bright outdoor lighting. Clear any walking paths of anything that might make someone trip, such as rocks or tools. Regularly check to see if handrails are loose or broken. Make sure that both sides of any steps have handrails. Any raised decks and porches should have guardrails on the edges. Have any leaves, snow, or ice cleared regularly. Use sand or salt on walking paths during winter. Clean up any spills in your garage right away. This includes oil or grease spills. What can I do in the bathroom? Use night lights. Install grab bars by the toilet and in the tub and shower. Do not use towel bars as grab bars. Use non-skid mats or decals in the tub or shower. If you need to sit down in the shower, use a plastic, non-slip stool. Keep the floor dry. Clean up any water that spills on the floor as soon as it happens. Remove soap buildup in the tub or shower regularly. Attach bath mats securely with double-sided non-slip rug tape. Do not have throw  rugs and other things on the floor that can make you trip. What can I do in the bedroom? Use night lights. Make sure that you have a light by your bed that is easy to reach. Do not use any sheets or blankets that are too big for your bed. They should not hang down onto the floor. Have a firm chair that has side arms. You can use this for support while you get dressed. Do not have throw rugs and other things on the floor that can make you trip. What can I do in the kitchen? Clean up any spills right away. Avoid walking on wet floors. Keep items that you use a lot in easy-to-reach places. If you need to reach something above you, use a strong step stool that has a grab bar. Keep electrical cords out of the way. Do not use floor polish or wax that makes floors slippery. If you must use wax, use non-skid floor wax. Do not have throw rugs and other things on the floor that can make you trip. What can I do with my stairs? Do not leave any items on the stairs. Make sure that there are handrails on both sides of the stairs and use them. Fix handrails that are broken or loose. Make sure that handrails are as long as the stairways. Check any carpeting to make sure that it is firmly attached to the stairs. Fix any carpet that is loose or worn. Avoid  having throw rugs at the top or bottom of the stairs. If you do have throw rugs, attach them to the floor with carpet tape. Make sure that you have a light switch at the top of the stairs and the bottom of the stairs. If you do not have them, ask someone to add them for you. What else can I do to help prevent falls? Wear shoes that: Do not have high heels. Have rubber bottoms. Are comfortable and fit you well. Are closed at the toe. Do not wear sandals. If you use a stepladder: Make sure that it is fully opened. Do not climb a closed stepladder. Make sure that both sides of the stepladder are locked into place. Ask someone to hold it for you, if  possible. Clearly mark and make sure that you can see: Any grab bars or handrails. First and last steps. Where the edge of each step is. Use tools that help you move around (mobility aids) if they are needed. These include: Canes. Walkers. Scooters. Crutches. Turn on the lights when you go into a dark area. Replace any light bulbs as soon as they burn out. Set up your furniture so you have a clear path. Avoid moving your furniture around. If any of your floors are uneven, fix them. If there are any pets around you, be aware of where they are. Review your medicines with your doctor. Some medicines can make you feel dizzy. This can increase your chance of falling. Ask your doctor what other things that you can do to help prevent falls. This information is not intended to replace advice given to you by your health care provider. Make sure you discuss any questions you have with your health care provider. Document Released: 06/22/2009 Document Revised: 02/01/2016 Document Reviewed: 09/30/2014 Elsevier Interactive Patient Education  2017 [employer].

## 2022-02-20 ENCOUNTER — Telehealth (HOSPITAL_COMMUNITY): Payer: Self-pay

## 2022-02-20 NOTE — Telephone Encounter (Signed)
Received a call from the [place] cardiac rehab department and pt would like to go to the [place] location for cardiac rehab. Will fax cardiac rehab referral to them.

## 2022-02-21 ENCOUNTER — Other Ambulatory Visit: Payer: Self-pay | Admitting: *Deleted

## 2022-02-21 DIAGNOSIS — Z951 Presence of aortocoronary bypass graft: Secondary | ICD-10-CM

## 2022-03-06 ENCOUNTER — Ambulatory Visit: Payer: Medicare PPO | Admitting: Nurse Practitioner

## 2022-03-06 ENCOUNTER — Encounter: Payer: Self-pay | Admitting: Nurse Practitioner

## 2022-03-06 VITALS — BP 126/68 | HR 83 | Ht 60.0 in | Wt 168.6 lb

## 2022-03-06 DIAGNOSIS — E785 Hyperlipidemia, unspecified: Secondary | ICD-10-CM

## 2022-03-06 DIAGNOSIS — N183 Chronic kidney disease, stage 3 unspecified: Secondary | ICD-10-CM

## 2022-03-06 DIAGNOSIS — I255 Ischemic cardiomyopathy: Secondary | ICD-10-CM

## 2022-03-06 DIAGNOSIS — I2510 Atherosclerotic heart disease of native coronary artery without angina pectoris: Secondary | ICD-10-CM | POA: Diagnosis not present

## 2022-03-06 DIAGNOSIS — I10 Essential (primary) hypertension: Secondary | ICD-10-CM | POA: Diagnosis not present

## 2022-03-06 MED ORDER — CARVEDILOL 3.125 MG PO TABS: 3.1250 mg | ORAL_TABLET | Freq: Two times a day (BID) | ORAL | 3 refills | Status: DC

## 2022-03-06 NOTE — Patient Instructions (Signed)
Medication Instructions:  Your physician has recommended you make the following change in your medication:   START Carvedilol 3.125 mg twice a day  *If you need a refill on your cardiac medications before your next appointment, please call your pharmacy*   Lab Work: None  If you have labs (blood work) drawn today and your tests are completely normal, you will receive your results only by: [place] (if you have MyChart) OR A paper copy in the mail If you have any lab test that is abnormal or we need to change your treatment, we will call you to review the results.   Testing/Procedures: None   Follow-Up: At [facility], you and your health needs are our priority.  As part of our continuing mission to provide you with exceptional heart care, we have created designated Provider Care Teams.  These Care Teams include your primary Cardiologist (physician) and Advanced Practice Providers (APPs -  Physician Assistants and Nurse Practitioners) who all work together to provide you with the care you need, when you need it.   Your next appointment:   3 month(s)  The format for your next appointment:   In Person  Provider:   Ida Rogue, MD or Murray Hodgkins, NP       Important Information About Sugar

## 2022-03-06 NOTE — Progress Notes (Signed)
Office Visit    Patient Name: Katherine Brooks Date of Encounter: 03/06/2022  Primary Care Provider:  Eugenia Pancoast, [facility] Primary Cardiologist:  Ida Rogue, MD  Chief Complaint    86 year old female with a history of hypertension, hyperlipidemia, stage III chronic kidney disease, left bundle branch block, and recent finding of ischemic cardiomyopathy/HFrEF and CAD, who presents for follow-up after recent coronary artery bypass grafting.  Past Medical History    Past Medical History:  Diagnosis Date   Allergy    CAD (coronary artery disease)    a. 12/2021 MV: mid-dist ant, antsept ischemia; b. 12/2021 Cath: LM 60ost, 90d, LAD 90ost, 95p, 37m D2 30, LCX 90p, RCA 90p; c. 12/2021 CABG x 4: LIMA->LAD, VG->OM, VG->Diag, VG->RPDA.   Cancer ([facility]    skin   Chronic HFrEF (heart failure with reduced ejection fraction) (HCC)    CKD (chronic kidney disease), stage III ([facility]    COVID-19 virus infection 09/2021   Facial pain    Hyperlipidemia LDL goal <70    Hypertension    Ischemic cardiomyopathy    a. 11/2021 Echo: EF 30-35%, glob HK, GrI DD, nl RV fxn, mildly dil LA, triv MR.   LBBB (left bundle branch block)    Past Surgical History:  Procedure Laterality Date   CORONARY ARTERY BYPASS GRAFT N/A 01/07/2022   Procedure: CORONARY ARTERY BYPASS GRAFTING (CABG) X FOUR BYPASSES USING OPEN LEFT INTERNAL MAMMARY ARTERY AND  ENDOSCOPIC RIGHT AND LEFT GREATER SAPHENOUS VEIN HARVEST.;  Surgeon: BGaye Pollack MD;  [place]  Service: Open Heart Surgery;  Laterality: N/A;   LEFT HEART CATH AND CORONARY ANGIOGRAPHY Left 01/01/2022   Procedure: LEFT HEART CATH AND CORONARY ANGIOGRAPHY;  Surgeon: ENelva Bush MD;  [facility];  Service: Cardiovascular;  Laterality: Left;   salivary gland N/A 12/2012   TEE WITHOUT CARDIOVERSION N/A 01/07/2022   Procedure: TRANSESOPHAGEAL ECHOCARDIOGRAM (TEE);  Surgeon: BGaye Pollack MD;  [place]  Service: Open Heart Surgery;   Laterality: N/A;    Allergies  Allergies  Allergen Reactions   Lisinopril Other (See Comments)    "passed out" Other reaction(s): Other (See Comments), Other (See Comments) "passed out" "I passed out"   Carbamazepine Rash    History of Present Illness    86year old female with the above past medical history including hypertension, hyperlipidemia, stage III chronic kidney disease, left bundle branch block.  She established care with Dr. GRockey Situin fiber 2023, in the setting of persistent dyspnea on exertion following COVID infection earlier in the year.  Echo showed an EF of 30 to 35% with global hypokinesis, grade 1 diastolic dysfunction, normal RV function, and trivial MR.  She was placed on beta-blocker and Entresto therapy and underwent stress testing in April, which showed a large region of mid to distal anterior and anteroseptal ischemia.  Diagnostic catheterization on January 01, 2022, showed severe multivessel CAD including a 90% distal left main/ostial LAD, along with 90% proximal left circumflex, and RCA stenoses.  [facility].  Postoperatively, she did require 1 unit of packed red blood cells and hypotension limited resumption of guideline directed medical therapy for HFrEF/ischemic cardiomyopathy.  She was subsequently discharged May 9.  At office follow-up on May 26, Ms. CSparkmanwas doing well.  Blood pressure was stable at 135/73 and midodrine therapy was discontinued.  Off of midodrine, blood pressures remained stable in the 1 teens  to 120s, and we deferred resumption of guideline directed medical therapy for the time being.  She was also feeling well at CT surgical follow-up on June 8.  Today, [name] indicates that she has continued to do well.  She feels like she is reversed time.  She is working on her garden frequently and is not experiencing any chest pain or dyspnea.  Her surgical incisions have healed well.  She  occasionally notes mild left greater than right ankle swelling.  She denies chest pain, dyspnea, palpitations, PND, orthopnea, dizziness, syncope, or early satiety.  She brings a list of blood pressures with her today and she is typically trending in the 1 teens to 130s.  Home Medications    Current Outpatient Medications  Medication Sig Dispense Refill   Ascorbic Acid (VITAMIN C) 1000 MG tablet Take 1,000 mg by mouth.     aspirin EC 81 MG tablet Take 1 tablet (81 mg total) by mouth daily. Swallow whole.     atorvastatin (LIPITOR) 80 MG tablet Take 1 tablet (80 mg total) by mouth daily. 90 tablet 1   CALCIUM PO Take 1,000 mg by mouth daily.     carboxymethylcellul-glycerin (REFRESH RELIEVA) 0.5-0.9 % ophthalmic solution Place 1 drop into both eyes 2 (two) times a week.     carvedilol (COREG) 3.125 MG tablet Take 1 tablet (3.125 mg total) by mouth 2 (two) times daily. 180 tablet 3   Cholecalciferol (VITAMIN D3) 125 MCG (5000 UT) TABS Take 5,000 Units by mouth daily.     furosemide (LASIX) 20 MG tablet Take 1 tablet (20 mg total) by mouth daily. 90 tablet 0   MAGNESIUM PO Take 400 mg by mouth daily.     Polyethylene Glycol 400 (BLINK TEARS OP) Place 1 drop into both eyes daily.     potassium chloride SA (KLOR-CON M) 20 MEQ tablet Take 1 tablet (20 mEq total) by mouth daily. 90 tablet 1   No current facility-administered medications for this visit.     Review of Systems    Occasional mild left greater than right lower extremity swelling.  She denies chest pain, palpitations, dyspnea, pnd, orthopnea, n, v, dizziness, syncope, weight gain, or early satiety.  All other systems reviewed and are otherwise negative except as noted above.   Cardiac Rehabilitation Eligibility Assessment      Physical Exam    VS:  BP 126/68   Pulse 83   Ht 5' (1.524 m)   Wt 168 lb 9.6 oz (76.5 kg)   SpO2 95%   BMI 32.93 kg/m  , BMI Body mass index is 32.93 kg/m.     GEN: Well nourished, well developed, in  no acute distress. HEENT: normal. Neck: Supple, no JVD, carotid bruits, or masses. Cardiac: RRR, no murmurs, rubs, or gallops. No clubbing, cyanosis, trace left ankle edema.  Radials/PT 2+ and equal bilaterally.  Midsternal, upper abdominal, and lower extremity incision sites have healed well without evidence of erythema or drainage. Respiratory:  Respirations regular and unlabored, clear to auscultation bilaterally. GI: Soft, nontender, nondistended, BS + x 4. MS: no deformity or atrophy. Skin: warm and dry, no rash. Neuro:  Strength and sensation are intact. Psych: Normal affect.  Accessory Clinical Findings    ECG personally reviewed by me today -regular sinus rhythm, 83, left bundle branch block- no acute changes.  Lab Results  Component Value Date   WBC 11.0 (H) 02/01/2022   HGB 11.0 (L) 02/01/2022   HCT 36.6 02/01/2022  MCV 92.2 02/01/2022   PLT 443 (H) 02/01/2022   Lab Results  Component Value Date   CREATININE 1.62 (H) 02/01/2022   BUN 20 02/01/2022   NA 141 02/01/2022   K 4.7 02/01/2022   CL 107 02/01/2022   CO2 24 02/01/2022   Lab Results  Component Value Date   ALT 14 02/01/2022   AST 23 02/01/2022   ALKPHOS 85 02/01/2022   BILITOT 0.8 02/01/2022   Lab Results  Component Value Date   CHOL 136 02/01/2022   HDL 48 02/01/2022   LDLCALC 71 02/01/2022   TRIG 86 02/01/2022   CHOLHDL 2.8 02/01/2022    Lab Results  Component Value Date   HGBA1C 5.5 01/06/2022    Assessment & Plan    1.  Coronary artery disease: Status post four-vessel bypass at [facility] on Jan 07, 2022.  She has continued to do well since her last visit and denies chest pain or dyspnea.  Surgical incisions have healed well.  She remains on aspirin and statin therapy, and as blood pressures have stabilized off of midodrine at home, I am adding back carvedilol 3.125 mg twice daily.  She is interested in pursuing cardiac rehab and has already been cleared by [facility].  Repeat referral was sent on  June 15 and patient is waiting to hear from rehab team.  2.  Ischemic cardiomyopathy/chronic HFrEF: Pre-CABG echo in March 2023 showed an EF of 30 to 35%.  Guideline directed medical therapy was limited postoperatively secondary to hypotension.  She is euvolemic on examination and feeling well at home.  Pressures at home have been stable off of midodrine, in the 1 teens to 130s.  I am adding back carvedilol 3.125 mg twice daily.  I have asked her to send [place] blood pressures in about a week, and if trends are stable, we will consider adding back Entresto as well.  With chronic kidney disease, would require close monitoring of creatinine if Entresto resumed.  Follow-up basic metabolic panel today.  May be able to reduce Lasix dosing and will need to consider initiation of an SGLT2 inhibitor if not cost prohibitive.  Likely plan for follow-up echocardiogram at the time of September follow-up.  3.  Essential hypertension/hypotension: Patient had postoperative hypotension requiring vasopressors and subsequently midodrine which was discontinued on May 26.  Pressures have been stable at home.  No significant hypertension.  Adding back low-dose beta-blocker in the setting of ischemic cardiomyopathy.  4.  Hyperlipidemia: LDL of 106 in April and subsequently 71 after a few weeks of atorvastatin therapy.  We discussed lipid management today and agreed to hold off on additional medication or repeating labs at this time and instead, wait for her to complete a few months of cardiac rehab and reevaluate lipids at that time in the setting of increased activity and perhaps weight loss.  5.  Stage III chronic kidney disease: Creatinine relatively stable at 1.62 on May 26.  Follow-up today.  6.  Normocytic anemia/acute blood loss anemia: This was present postoperatively.  H&H were stable at 11 and 36.6 respectively on May 26.  7.  Disposition: Follow-up basic metabolic panel today.  Plan to follow-up echo in 2 to 3 months  with office visit at that time.   Murray Hodgkins, NP 03/06/2022, 11:54 AM

## 2022-03-11 ENCOUNTER — Ambulatory Visit: Payer: Medicare PPO | Admitting: Cardiovascular Disease

## 2022-03-18 ENCOUNTER — Telehealth: Payer: Self-pay | Admitting: Nurse Practitioner

## 2022-03-18 ENCOUNTER — Other Ambulatory Visit
Admission: RE | Admit: 2022-03-18 | Discharge: 2022-03-18 | Disposition: A | Discharge status: Home / Self Care | Payer: Medicare PPO | Attending: Nurse Practitioner | Admitting: Nurse Practitioner

## 2022-03-18 ENCOUNTER — Other Ambulatory Visit: Payer: Self-pay | Admitting: *Deleted

## 2022-03-18 DIAGNOSIS — Z79899 Other long term (current) drug therapy: Secondary | ICD-10-CM | POA: Insufficient documentation

## 2022-03-18 DIAGNOSIS — N183 Chronic kidney disease, stage 3 unspecified: Secondary | ICD-10-CM

## 2022-03-18 DIAGNOSIS — I255 Ischemic cardiomyopathy: Secondary | ICD-10-CM | POA: Diagnosis not present

## 2022-03-18 DIAGNOSIS — E875 Hyperkalemia: Secondary | ICD-10-CM

## 2022-03-18 DIAGNOSIS — I10 Essential (primary) hypertension: Secondary | ICD-10-CM | POA: Insufficient documentation

## 2022-03-18 DIAGNOSIS — R600 Localized edema: Secondary | ICD-10-CM

## 2022-03-18 LAB — BASIC METABOLIC PANEL
Anion gap: 9 (ref 5–15)
BUN: 25 mg/dL — ABNORMAL HIGH (ref 8–23)
CO2: 22 mmol/L (ref 22–32)
Calcium: 9.5 mg/dL (ref 8.9–10.3)
Chloride: 108 mmol/L (ref 98–111)
Creatinine, Ser: 1.63 mg/dL — ABNORMAL HIGH (ref 0.44–1.00)
GFR, Estimated: 31 mL/min — ABNORMAL LOW (ref >60)
Glucose, Bld: 95 mg/dL (ref 70–99)
Potassium: 5.3 mmol/L — ABNORMAL HIGH (ref 3.5–5.1)
Sodium: 139 mmol/L (ref 135–145)

## 2022-03-18 MED ORDER — FUROSEMIDE 20 MG PO TABS: ORAL_TABLET | ORAL | 0 refills | Status: DC

## 2022-03-18 NOTE — Progress Notes (Signed)
BMP order.

## 2022-03-18 NOTE — Telephone Encounter (Signed)
Katherine Gianotti, NP  03/18/2022  3:08 PM EDT     Kidney function stable, but still on higher end of her baseline.  Reduce lasix to 1/2 tab daily.  Potassium is mildly elevated.  Stop potassium supplement.  Will need a f/u bmet later this week to reassess potassium.  Will not resume entresto in light of elevated potassium.

## 2022-03-18 NOTE — Telephone Encounter (Signed)
I called and spoke with the patient regarding her lab results and Ignacia Bayley, NP's recommendations to:  1) DECREASE lasix 20 mg- take 0.5 tablet (10 mg) once daily 2) STOP potassium 3) REPEAT a BMP on Friday 4) Hold off on restarting Entresto for now  The patient voices understanding of all of the results and above recommendations.  BMP order placed.

## 2022-03-22 ENCOUNTER — Other Ambulatory Visit
Admission: RE | Admit: 2022-03-22 | Discharge: 2022-03-22 | Disposition: A | Discharge status: Home / Self Care | Payer: Medicare PPO | Attending: Nurse Practitioner | Admitting: Nurse Practitioner

## 2022-03-22 DIAGNOSIS — Z79899 Other long term (current) drug therapy: Secondary | ICD-10-CM

## 2022-03-22 DIAGNOSIS — E875 Hyperkalemia: Secondary | ICD-10-CM

## 2022-03-22 DIAGNOSIS — I10 Essential (primary) hypertension: Secondary | ICD-10-CM | POA: Diagnosis not present

## 2022-03-22 DIAGNOSIS — N183 Chronic kidney disease, stage 3 unspecified: Secondary | ICD-10-CM

## 2022-03-22 LAB — BASIC METABOLIC PANEL
Anion gap: 7 (ref 5–15)
BUN: 28 mg/dL — ABNORMAL HIGH (ref 8–23)
CO2: 21 mmol/L — ABNORMAL LOW (ref 22–32)
Calcium: 9.2 mg/dL (ref 8.9–10.3)
Chloride: 109 mmol/L (ref 98–111)
Creatinine, Ser: 1.48 mg/dL — ABNORMAL HIGH (ref 0.44–1.00)
GFR, Estimated: 35 mL/min — ABNORMAL LOW (ref >60)
Glucose, Bld: 101 mg/dL — ABNORMAL HIGH (ref 70–99)
Potassium: 4.7 mmol/L (ref 3.5–5.1)
Sodium: 137 mmol/L (ref 135–145)

## 2022-05-15 ENCOUNTER — Other Ambulatory Visit: Payer: Self-pay | Admitting: Cardiovascular Disease

## 2022-06-10 NOTE — Progress Notes (Signed)
Cardiology Office Note  Date:  06/11/2022   ID:  Katherine Brooks, DOB [birth year], MRN [id number]  PCP:  Katherine Brooks   Chief Complaint  Patient presents with   3 month follow up     "Doing well." Medications reviewed by the patient verbally.     HPI:  Ms. Katherine Brooks is a 86 year old woman with past medical history of COVID January 2023 Hypertension Left bundle branch block CAD, hx of CABG Chronic kidney disease GFR 28, dr.  Holley Raring ,nephrology Presenting for f/u of her dyspnea on exertion , cardiomyopathy ejection fraction 30 to 35%  Last seen by myself in clinic April 2023 Stress test April 2023 performed for low ejection fraction on echo and showed [place] of breath February 2023 showing anterior ischemia  Cardiac catheterization January 01, 2022 multivessel coronary disease Transferred to [facility] for CABG x4 Jan 07, 2022 Postoperatively transfusion 1 unit, hypotension discharge May 9  Seen in the office Feb 01, 2022, midodrine held Seen by one of our providers June 2023 Renal dysfunction creatinine 1.4 Followed by Dr. Holley Raring in [place]  In follow-up today reports she feels well with no complaints, active in the garden, no shortness of breath or chest pain on exertion No orthostasis symptoms On Lasix daily  EKG personally reviewed by myself on todays visit Normal sinus rhythm left bundle branch block rate 65 bpm  Other past medical history reviewed Worsening shortness of breath X-ray February 2023 with small bilateral pleural effusions, possible pulmonary edema  Echo February 2023 EF 30 to 35%  husband with medical issues, Possible Parkinson's symptoms, weakness, gait instability    PMH:   has a past medical history of Allergy, CAD (coronary artery disease), Cancer ([place]), Chronic HFrEF (heart failure with reduced ejection fraction) ([place]), CKD (chronic kidney disease), stage III ([place]), COVID-19 virus infection (09/2021), Facial pain, Hyperlipidemia LDL goal <70,  Hypertension, Ischemic cardiomyopathy, and LBBB (left bundle branch block).  PSH:    Past Surgical History:  Procedure Laterality Date   CORONARY ARTERY BYPASS GRAFT N/A 01/07/2022   Procedure: CORONARY ARTERY BYPASS GRAFTING (CABG) X FOUR BYPASSES USING OPEN LEFT INTERNAL MAMMARY ARTERY AND  ENDOSCOPIC RIGHT AND LEFT GREATER SAPHENOUS VEIN HARVEST.;  Surgeon: Gaye Pollack, MD;  Location: [place];  Service: Open Heart Surgery;  Laterality: N/A;   LEFT HEART CATH AND CORONARY ANGIOGRAPHY Left 01/01/2022   Procedure: LEFT HEART CATH AND CORONARY ANGIOGRAPHY;  Surgeon: Nelva Bush, MD;  Location: [place] [facility];  Service: Cardiovascular;  Laterality: Left;   salivary gland N/A 12/2012   TEE WITHOUT CARDIOVERSION N/A 01/07/2022   Procedure: TRANSESOPHAGEAL ECHOCARDIOGRAM (TEE);  Surgeon: Gaye Pollack, MD;  Location: [place];  Service: Open Heart Surgery;  Laterality: N/A;    Current Outpatient Medications  Medication Sig Dispense Refill   Ascorbic Acid (VITAMIN C) 1000 MG tablet Take 1,000 mg by mouth.     aspirin EC 81 MG tablet Take 1 tablet (81 mg total) by mouth daily. Swallow whole.     atorvastatin (LIPITOR) 80 MG tablet Take 1 tablet (80 mg total) by mouth daily. 90 tablet 1   Biotin 10000 MCG TABS      carboxymethylcellul-glycerin (REFRESH RELIEVA) 0.5-0.9 % ophthalmic solution Place 1 drop into both eyes 2 (two) times a week.     carvedilol (COREG) 3.125 MG tablet Take 1 tablet (3.125 mg total) by mouth 2 (two) times daily. 180 tablet 3   Cholecalciferol (VITAMIN D3) 125 MCG (5000 UT) TABS Take  5,000 Units by mouth daily.     ferrous sulfate 324 MG TBEC Take 324 mg by mouth daily with breakfast.     furosemide (LASIX) 20 MG tablet TAKE 1 TABLET EVERY DAY 90 tablet 0   Magnesium 400 MG CAPS      MAGNESIUM PO Take 400 mg by mouth daily.     Multiple Vitamin (MULTI VITAMIN DAILY PO) Take by mouth daily.     Polyethylene Glycol 400 (BLINK TEARS OP) Place 1 drop into both eyes  daily.     No current facility-administered medications for this visit.    Allergies:   Lisinopril and Carbamazepine   Social History:  The patient  reports that she has never smoked. She has never been exposed to tobacco smoke. She has never used smokeless tobacco. She reports that she does not drink alcohol and does not use drugs.   Family History:   family history includes COPD in her brother; Cancer in her sister; Heart disease in her mother; Kidney Stones in her sister; Lung disease in her father; Rheum arthritis in her sister.    Review of Systems: Review of Systems  Constitutional: Negative.   HENT: Negative.    Respiratory: Negative.    Cardiovascular: Negative.   Gastrointestinal: Negative.   Musculoskeletal: Negative.   Neurological: Negative.   Psychiatric/Behavioral: Negative.    All other systems reviewed and are negative.   PHYSICAL EXAM: VS:  BP 122/60 (BP Location: Left Arm, Patient Position: Sitting, Cuff Size: Normal)   Pulse 65   Ht 5' (1.524 m)   Wt 175 lb (79.4 kg)   BMI 34.18 kg/m  , BMI Body mass index is 34.18 kg/m. Constitutional:  oriented to person, place, and time. No distress.  HENT:  Head: Grossly normal Eyes:  no discharge. No scleral icterus.  Neck: No JVD, no carotid bruits  Cardiovascular: Regular rate and rhythm, no murmurs appreciated Pulmonary/Chest: Clear to auscultation bilaterally, no wheezes or rails Abdominal: Soft.  no distension.  no tenderness.  Musculoskeletal: Normal range of motion Neurological:  normal muscle tone. Coordination normal. No atrophy Skin: Skin warm and dry Psychiatric: normal affect, pleasant  Recent Labs: 11/05/2021: Pro B Natriuretic peptide (BNP) 884.0; TSH 3.19 01/08/2022: Magnesium 2.7 02/01/2022: ALT 14; Hemoglobin 11.0; Platelets 443 03/22/2022: BUN 28; Creatinine, Ser 1.48; Potassium 4.7; Sodium 137    Lipid Panel Lab Results  Component Value Date   CHOL 136 02/01/2022   HDL 48 02/01/2022    LDLCALC 71 02/01/2022   TRIG 86 02/01/2022    Wt Readings from Last 3 Encounters:  06/11/22 175 lb (79.4 kg)  03/06/22 168 lb 9.6 oz (76.5 kg)  02/15/22 165 lb (74.8 kg)     ASSESSMENT AND PLAN:  Problem List Items Addressed This Visit       Cardiology Problems   CAD (coronary artery disease)     Other   Pedal edema   Other Visit Diagnoses     Ischemic cardiomyopathy    -  Primary   Relevant Orders   EKG 12-Lead   Essential hypertension       Relevant Orders   EKG 12-Lead   Stage 3 chronic kidney disease, unspecified whether stage 3a or 3b CKD ([place])       Relevant Orders   EKG 12-Lead   Hyperlipidemia LDL goal <70       Chronic HFrEF (heart failure with reduced ejection fraction) (HCC)       Relevant Orders   EKG 12-Lead  Dilated cardiomyopathy Ejection fraction 30 to 35% in February 2023 leading to stress test showing anterior wall ischemia leading to cardiac catheterization showing multivessel disease leading to CABG at [facility] Has recovered well since then Recommend we add losartan 25 mg daily Continue carvedilol 3.125 twice daily, Lasix 20 daily Lab work /BMP in 3 weeks In follow-up we will consider adding Farxiga 10 mg daily In future could consider transition to [facility] if blood pressure tolerates and stable renal function Repeat echocardiogram once medications titrated  Coronary artery disease with stable angina Hx of CABG, recovered well Denies anginal symptoms on exertion Continue beta-blocker, statin  Essential hypertension Medication changes as above in the setting of cardiomyopathy   chronic renal dysfunction Creatinine up to 1.4, followed by nephrology renal ultrasound 2022 AR[facility] as above, consideration of Farxiga and follow-up  Pleural effusion small bilateral on prior chest x-ray Continue Lasix 20 daily  Left bundle branch block Dates back to 2014 Cardiac work-up as above   Total encounter time more than 30 minutes  Greater than 50%  was spent in counseling and coordination of care with the patient    Signed, Esmond Plants, M.D., Ph.D. [place], [place]

## 2022-06-11 ENCOUNTER — Encounter: Payer: Self-pay | Admitting: Cardiovascular Disease

## 2022-06-11 ENCOUNTER — Ambulatory Visit: Payer: Medicare PPO | Attending: Cardiovascular Disease | Admitting: Cardiovascular Disease

## 2022-06-11 VITALS — BP 122/60 | HR 65 | Ht 60.0 in | Wt 175.0 lb

## 2022-06-11 DIAGNOSIS — I5022 Chronic systolic (congestive) heart failure: Secondary | ICD-10-CM | POA: Diagnosis not present

## 2022-06-11 DIAGNOSIS — R600 Localized edema: Secondary | ICD-10-CM | POA: Diagnosis not present

## 2022-06-11 DIAGNOSIS — I2510 Atherosclerotic heart disease of native coronary artery without angina pectoris: Secondary | ICD-10-CM

## 2022-06-11 DIAGNOSIS — I255 Ischemic cardiomyopathy: Secondary | ICD-10-CM | POA: Diagnosis not present

## 2022-06-11 DIAGNOSIS — E785 Hyperlipidemia, unspecified: Secondary | ICD-10-CM | POA: Diagnosis not present

## 2022-06-11 DIAGNOSIS — I10 Essential (primary) hypertension: Secondary | ICD-10-CM

## 2022-06-11 DIAGNOSIS — N183 Chronic kidney disease, stage 3 unspecified: Secondary | ICD-10-CM

## 2022-06-11 MED ORDER — LOSARTAN POTASSIUM 25 MG PO TABS: 25.0000 mg | ORAL_TABLET | Freq: Every day | ORAL | 3 refills | Status: DC

## 2022-06-11 NOTE — Patient Instructions (Addendum)
Call Dr. Holley Raring office, ask to stay in [place], Dr. Candiss Norse   Medication Instructions:  Please start losartan 25 mg daily  If you need a refill on your cardiac medications before your next appointment, please call your pharmacy.   Lab work: Labs a few days before office visit in one month. You need to go to the hospital lab. You do not need to fast or need an appointment.  Testing/Procedures: No new testing needed  Follow-Up: At [facility], you and your health needs are our priority.  As part of our continuing mission to provide you with exceptional heart care, we have created designated Provider Care Teams.  These Care Teams include your primary Cardiologist (physician) and Advanced Practice Providers (APPs -  Physician Assistants and Nurse Practitioners) who all work together to provide you with the care you need, when you need it.  You will need a follow up appointment in 1 month, APP ok  Providers on your designated Care Team:   Murray Hodgkins, NP Christell Faith, PA-C Cadence Kathlen Mody, [place]  COVID-19 Vaccine Information can be found at: [email or web address] For questions related to vaccine distribution or appointments, please email [email or web address] or call [phone].

## 2022-06-21 DIAGNOSIS — H1033 Unspecified acute conjunctivitis, bilateral: Secondary | ICD-10-CM | POA: Diagnosis not present

## 2022-06-28 DIAGNOSIS — H2513 Age-related nuclear cataract, bilateral: Secondary | ICD-10-CM | POA: Diagnosis not present

## 2022-06-28 DIAGNOSIS — H16203 Unspecified keratoconjunctivitis, bilateral: Secondary | ICD-10-CM | POA: Diagnosis not present

## 2022-07-12 ENCOUNTER — Other Ambulatory Visit
Admission: RE | Admit: 2022-07-12 | Discharge: 2022-07-12 | Disposition: A | Discharge status: Home / Self Care | Payer: Medicare PPO | Source: Ambulatory Visit | Attending: Cardiovascular Disease | Admitting: Cardiovascular Disease

## 2022-07-12 DIAGNOSIS — I5022 Chronic systolic (congestive) heart failure: Secondary | ICD-10-CM | POA: Insufficient documentation

## 2022-07-12 DIAGNOSIS — I255 Ischemic cardiomyopathy: Secondary | ICD-10-CM | POA: Diagnosis not present

## 2022-07-12 LAB — BASIC METABOLIC PANEL
Anion gap: 7 (ref 5–15)
BUN: 26 mg/dL — ABNORMAL HIGH (ref 8–23)
CO2: 26 mmol/L (ref 22–32)
Calcium: 9.4 mg/dL (ref 8.9–10.3)
Chloride: 107 mmol/L (ref 98–111)
Creatinine, Ser: 1.58 mg/dL — ABNORMAL HIGH (ref 0.44–1.00)
GFR, Estimated: 32 mL/min — ABNORMAL LOW (ref >60)
Glucose, Bld: 93 mg/dL (ref 70–99)
Potassium: 5.2 mmol/L — ABNORMAL HIGH (ref 3.5–5.1)
Sodium: 140 mmol/L (ref 135–145)

## 2022-07-15 ENCOUNTER — Other Ambulatory Visit: Payer: Self-pay

## 2022-07-15 DIAGNOSIS — Z79899 Other long term (current) drug therapy: Secondary | ICD-10-CM

## 2022-07-17 ENCOUNTER — Encounter: Payer: Self-pay | Admitting: Nurse Practitioner

## 2022-07-17 ENCOUNTER — Ambulatory Visit: Payer: Medicare PPO | Attending: Nurse Practitioner | Admitting: Nurse Practitioner

## 2022-07-17 VITALS — BP 122/62 | HR 71 | Ht 60.0 in | Wt 177.0 lb

## 2022-07-17 DIAGNOSIS — E785 Hyperlipidemia, unspecified: Secondary | ICD-10-CM | POA: Diagnosis not present

## 2022-07-17 DIAGNOSIS — E875 Hyperkalemia: Secondary | ICD-10-CM

## 2022-07-17 DIAGNOSIS — I2510 Atherosclerotic heart disease of native coronary artery without angina pectoris: Secondary | ICD-10-CM | POA: Diagnosis not present

## 2022-07-17 DIAGNOSIS — N1832 Chronic kidney disease, stage 3b: Secondary | ICD-10-CM | POA: Diagnosis not present

## 2022-07-17 DIAGNOSIS — D631 Anemia in chronic kidney disease: Secondary | ICD-10-CM | POA: Diagnosis not present

## 2022-07-17 DIAGNOSIS — I255 Ischemic cardiomyopathy: Secondary | ICD-10-CM

## 2022-07-17 DIAGNOSIS — I10 Essential (primary) hypertension: Secondary | ICD-10-CM | POA: Diagnosis not present

## 2022-07-17 DIAGNOSIS — I5022 Chronic systolic (congestive) heart failure: Secondary | ICD-10-CM | POA: Diagnosis not present

## 2022-07-17 DIAGNOSIS — R600 Localized edema: Secondary | ICD-10-CM | POA: Diagnosis not present

## 2022-07-17 DIAGNOSIS — N183 Chronic kidney disease, stage 3 unspecified: Secondary | ICD-10-CM | POA: Diagnosis not present

## 2022-07-17 DIAGNOSIS — I129 Hypertensive chronic kidney disease with stage 1 through stage 4 chronic kidney disease, or unspecified chronic kidney disease: Secondary | ICD-10-CM | POA: Diagnosis not present

## 2022-07-17 NOTE — Progress Notes (Signed)
Office Visit    Patient Name: Katherine Brooks Date of Encounter: 07/17/2022  Primary Care Provider:  Eugenia Pancoast, [place] Primary Cardiologist:  Ida Rogue, MD  Chief Complaint    Katherine Brooks is a 86 y.o female with past medical history of CAD, ischemic cardiomyopathy/HFrEF, hypertension, hyperlipidemia, stage III chronic kidney disease, and left bundle branch block, who presents for a follow-up related to CAD and HFrEF.  Past Medical History    Past Medical History:  Diagnosis Date   Allergy    CAD (coronary artery disease)    a. 12/2021 MV: mid-dist ant, antsept ischemia; b. 12/2021 Cath: LM 60ost, 90d, LAD 90ost, 95p, 80m D2 30, LCX 90p, RCA 90p; c. 12/2021 CABG x 4: LIMA->LAD, VG->OM, VG->Diag, VG->RPDA.   Cancer ([facility]    skin   Chronic HFrEF (heart failure with reduced ejection fraction) (HCC)    CKD (chronic kidney disease), stage III ([facility]    COVID-19 virus infection 09/2021   Facial pain    Hyperlipidemia LDL goal <70    Hypertension    Ischemic cardiomyopathy    a. 11/2021 Echo: EF 30-35%, glob HK, GrI DD, nl RV fxn, mildly dil LA, triv MR.   LBBB (left bundle branch block)    Past Surgical History:  Procedure Laterality Date   CORONARY ARTERY BYPASS GRAFT N/A 01/07/2022   Procedure: CORONARY ARTERY BYPASS GRAFTING (CABG) X FOUR BYPASSES USING OPEN LEFT INTERNAL MAMMARY ARTERY AND  ENDOSCOPIC RIGHT AND LEFT GREATER SAPHENOUS VEIN HARVEST.;  Surgeon: BGaye Pollack MD;  [place] [facility]  Service: Open Heart Surgery;  Laterality: N/A;   LEFT HEART CATH AND CORONARY ANGIOGRAPHY Left 01/01/2022   Procedure: LEFT HEART CATH AND CORONARY ANGIOGRAPHY;  Surgeon: ENelva Bush MD;  [facility];  Service: Cardiovascular;  Laterality: Left;   salivary gland N/A 12/2012   TEE WITHOUT CARDIOVERSION N/A 01/07/2022   Procedure: TRANSESOPHAGEAL ECHOCARDIOGRAM (TEE);  Surgeon: BGaye Pollack MD;  [place] [facility]  Service: Open Heart Surgery;  Laterality:  N/A;    Allergies  Allergies  Allergen Reactions   Lisinopril Other (See Comments)    "passed out" Other reaction(s): Other (See Comments), Other (See Comments) "passed out" "I passed out"   Carbamazepine Rash    History of Present Illness    Katherine Dicostanzois a 86y.o female with past medical history of CAD, ischemic cardiomyopathy/HFrEF, hypertension, hyperlipidemia, stage III chronic kidney disease, and left bundle branch block.  She established care in February 2023 with Dr. GRockey Situwith persistent dyspnea on exertion following a COVID infection in early 2023.  Echo at that time suggested an EF of 30-35% with global hypokinesia, GR1 DD, normal RV function, and trivial MR.  She underwent stress testing in April 2023, which showed a large region of mid to distal anterior and anteroseptal ischemia.  Subsequent diagnostic catheterization on 12/22/21 showed severe multivessel CAD including a 90% distal left main/ostial LAD, 90% proximal left circumflex, and RCA stenosis.  [facility]  She required 1 unit PRBC and guideline directed medical therapy for HFrEF/ischemic cardiomyopathy was deferred at discharge on 01/15/22 in the setting of persistent hypotension.  She was seen in the office on 03/06/22 with stabilized blood pressures.  Beta-blocker was added back at that time.  Kidney function was assessed and in the setting of decreased functioning, Entresto was deferred.   She was last seen in the office by  Dr. Rockey Situ in 06/2022 and started on losartan 25 mg daily.  Since last being seen she states that she has been feeling well.  F/u labs after starting losartan revealed stable renal fxn w/ mild hyperkalemia @ 5.2.  She was advised to avoid K+ rich foods w/ plan for f/u labs in 2 wks.  She is tolerating the losartan and reports no lightheadedness or dizziness.  Her recent blood pressures at home have been trending in the 1 teens to 120s.  She does  yard work or gardening every day and denies any issues with these activities.  She has occasional swelling in her left lower leg.  She denies chest pain, dyspnea on exertion, palpitations, PND, orthopnea, syncope, or early satiety.   Home Medications    Current Outpatient Medications  Medication Sig Dispense Refill   aspirin EC 81 MG tablet Take 1 tablet (81 mg total) by mouth daily. Swallow whole.     atorvastatin (LIPITOR) 80 MG tablet Take 1 tablet (80 mg total) by mouth daily. 90 tablet 1   Biotin 10000 MCG TABS      carboxymethylcellul-glycerin (REFRESH RELIEVA) 0.5-0.9 % ophthalmic solution Place 1 drop into both eyes 2 (two) times a week.     Cholecalciferol (VITAMIN D3) 125 MCG (5000 UT) TABS Take 5,000 Units by mouth daily.     ferrous sulfate 324 MG TBEC Take 324 mg by mouth daily with breakfast.     furosemide (LASIX) 20 MG tablet TAKE 1 TABLET EVERY DAY 90 tablet 0   losartan (COZAAR) 25 MG tablet Take 1 tablet (25 mg total) by mouth daily. 90 tablet 3   MAGNESIUM PO Take 400 mg by mouth daily.     Polyethylene Glycol 400 (BLINK TEARS OP) Place 1 drop into both eyes daily.     Ascorbic Acid (VITAMIN C) 1000 MG tablet Take 1,000 mg by mouth.     carvedilol (COREG) 3.125 MG tablet Take 1 tablet (3.125 mg total) by mouth 2 (two) times daily. 180 tablet 3   Magnesium 400 MG CAPS  (Patient not taking: Reported on 07/17/2022)     Multiple Vitamin (MULTI VITAMIN DAILY PO) Take by mouth daily. (Patient not taking: Reported on 07/17/2022)     No current facility-administered medications for this visit.     Review of Systems    Occasional left lower extremity swelling.  Denies chest pain, palpitations, dyspnea, pnd, orthopnea, dizziness, syncope, weight gain, or early satiety.  All other systems reviewed and are otherwise negative except as noted above.    Physical Exam    VS:  BP 122/62 (BP Location: Left Arm, Patient Position: Sitting, Cuff Size: Normal)   Pulse 71   Ht 5' (1.524  m)   Wt 177 lb (80.3 kg)   SpO2 99%   BMI 34.57 kg/m  , BMI Body mass index is 34.57 kg/m.     GEN: Well nourished, well developed, in no acute distress. HEENT: normal. Neck: Supple, no JVD, carotid bruits, or masses. Cardiac: RRR, no murmurs, rubs, or gallops. No clubbing, cyanosis, edema.  Radials/PT 2+ and equal bilaterally.  Respiratory:  Respirations regular and unlabored, clear to auscultation bilaterally. GI: Soft, nontender, nondistended, BS + x 4. MS: no deformity or atrophy. Skin: warm and dry, no rash. Neuro:  Strength and sensation are intact. Psych: Normal affect.  Accessory Clinical Findings   Lab Results  Component Value Date   WBC 11.0 (H) 02/01/2022   HGB 11.0 (L) 02/01/2022   HCT 36.6  02/01/2022   MCV 92.2 02/01/2022   PLT 443 (H) 02/01/2022   Lab Results  Component Value Date   CREATININE 1.58 (H) 07/12/2022   BUN 26 (H) 07/12/2022   NA 140 07/12/2022   K 5.2 (H) 07/12/2022   CL 107 07/12/2022   CO2 26 07/12/2022   Lab Results  Component Value Date   ALT 14 02/01/2022   AST 23 02/01/2022   ALKPHOS 85 02/01/2022   BILITOT 0.8 02/01/2022   Lab Results  Component Value Date   CHOL 136 02/01/2022   HDL 48 02/01/2022   LDLCALC 71 02/01/2022   TRIG 86 02/01/2022   CHOLHDL 2.8 02/01/2022    Lab Results  Component Value Date   HGBA1C 5.5 01/06/2022    Assessment & Plan    1.  Coronary artery disease: s/p CABG x 4 on 01/07/22.  She has continued to do well since her surgery.  She denies chest pain or shortness of breath.  Continue carvedilol, aspirin, and atorvastatin.  2. Ischemic cardiomyopathy/chronic HFrEF: Pre-CABG echo in 11/2021 suggested an EF of 30-35%.  GDMT limited s/p CABG x 4 d/t postoperative hypotension.  Blood pressures now stable on losartan and carvedilol.  Initiation of Jardiance deferred in the setting of elevated creatinine (1.58) and potassium (5.2).  She will have complete metabolic panel drawn in two weeks.  We will also  order a repeat echocardiogram to reassess EF and determine potential need for EP eval.  Continue losartan, carvedilol, and Lasix.   3. Essential hypertension: Blood pressure today at goal.  Patient tolerating losartan well. Denies dizziness or lightheadedness.  Continue losartan and carvedilol.   4. Hyperlipidemia: LDL 71 on 02/01/22 on atorvastatin.  We will check a lipid panel and LFTs at lab draw mentioned above.  Will consider starting Zetia in the future if LDL is not at goal.   5. Stage III chronic kidney disease: Creatinine 1.58, being followed by nephrology.  Repeat complete metabolic panel in 2 weeks as mentioned above.   6.  Hyperkalemia:  Mildly elevated K+ following initiation of losartan @ 5.2 on 11/3.  Plan to f/u bmet in 2 wks.  If persistently elev K+, will need to stop ARB.  7. Disposition: Follow-up  in 4 months or sooner if necessary.    Murray Hodgkins, NP 07/17/2022, 12:56 PM

## 2022-07-17 NOTE — Patient Instructions (Signed)
Medication Instructions:  No changes at this time.   *If you need a refill on your cardiac medications before your next appointment, please call your pharmacy*   Lab Work: CMET, Lipid panel. No appointment is needed. These are fasting labs so nothing to eat or drink after midnight except sip of water with your medications. Go to the following location:   [facility] Entrance at [facility] 1st desk on the right to check in (REGISTRATION)  Lab hours: Monday- Friday (7:30 am- 5:30 pm)  If you have labs (blood work) drawn today and your tests are completely normal, you will receive your results only by: [place] (if you have MyChart) OR A paper copy in the mail If you have any lab test that is abnormal or we need to change your treatment, we will call you to review the results.   Testing/Procedures: Your physician has requested that you have an echocardiogram. Echocardiography is a painless test that uses sound waves to create images of your heart. It provides your doctor with information about the size and shape of your heart and how well your heart's chambers and valves are working. This procedure takes approximately one hour. There are no restrictions for this procedure. Please do NOT wear cologne, perfume, aftershave, or lotions (deodorant is allowed). Please arrive 15 minutes prior to your appointment time.    Follow-Up: At [facility], you and your health needs are our priority.  As part of our continuing mission to provide you with exceptional heart care, we have created designated Provider Care Teams.  These Care Teams include your primary Cardiologist (physician) and Advanced Practice Providers (APPs -  Physician Assistants and Nurse Practitioners) who all work together to provide you with the care you need, when you need it.   Your next appointment:   4 month(s)  The format for your next appointment:   In Person  Provider:   Ida Rogue, MD or Murray Hodgkins, NP        Important Information About Sugar

## 2022-07-29 ENCOUNTER — Other Ambulatory Visit
Admission: RE | Admit: 2022-07-29 | Discharge: 2022-07-29 | Disposition: A | Discharge status: Home / Self Care | Payer: Medicare PPO | Source: Ambulatory Visit | Attending: Nurse Practitioner | Admitting: Nurse Practitioner

## 2022-07-29 DIAGNOSIS — N183 Chronic kidney disease, stage 3 unspecified: Secondary | ICD-10-CM | POA: Insufficient documentation

## 2022-07-29 DIAGNOSIS — I2510 Atherosclerotic heart disease of native coronary artery without angina pectoris: Secondary | ICD-10-CM | POA: Insufficient documentation

## 2022-07-29 DIAGNOSIS — I5022 Chronic systolic (congestive) heart failure: Secondary | ICD-10-CM | POA: Diagnosis not present

## 2022-07-29 DIAGNOSIS — I255 Ischemic cardiomyopathy: Secondary | ICD-10-CM | POA: Insufficient documentation

## 2022-07-29 LAB — LIPID PANEL
Cholesterol: 160 mg/dL (ref 0–200)
HDL: 61 mg/dL (ref >40)
LDL Cholesterol: 88 mg/dL (ref 0–99)
Total CHOL/HDL Ratio: 2.6 RATIO
Triglycerides: 55 mg/dL (ref <150)
VLDL: 11 mg/dL (ref 0–40)

## 2022-07-29 LAB — COMPREHENSIVE METABOLIC PANEL
ALT: 17 U/L (ref 0–44)
AST: 28 U/L (ref 15–41)
Albumin: 4.1 g/dL (ref 3.5–5.0)
Alkaline Phosphatase: 66 U/L (ref 38–126)
Anion gap: 9 (ref 5–15)
BUN: 36 mg/dL — ABNORMAL HIGH (ref 8–23)
CO2: 24 mmol/L (ref 22–32)
Calcium: 9.8 mg/dL (ref 8.9–10.3)
Chloride: 107 mmol/L (ref 98–111)
Creatinine, Ser: 1.52 mg/dL — ABNORMAL HIGH (ref 0.44–1.00)
GFR, Estimated: 34 mL/min — ABNORMAL LOW (ref >60)
Glucose, Bld: 103 mg/dL — ABNORMAL HIGH (ref 70–99)
Potassium: 5 mmol/L (ref 3.5–5.1)
Sodium: 140 mmol/L (ref 135–145)
Total Bilirubin: 0.9 mg/dL (ref 0.3–1.2)
Total Protein: 7.5 g/dL (ref 6.5–8.1)

## 2022-07-30 ENCOUNTER — Telehealth: Payer: Self-pay | Admitting: *Deleted

## 2022-07-30 DIAGNOSIS — N183 Chronic kidney disease, stage 3 unspecified: Secondary | ICD-10-CM

## 2022-07-30 DIAGNOSIS — I5022 Chronic systolic (congestive) heart failure: Secondary | ICD-10-CM

## 2022-07-30 DIAGNOSIS — E785 Hyperlipidemia, unspecified: Secondary | ICD-10-CM

## 2022-07-30 DIAGNOSIS — I255 Ischemic cardiomyopathy: Secondary | ICD-10-CM

## 2022-07-30 DIAGNOSIS — E875 Hyperkalemia: Secondary | ICD-10-CM

## 2022-07-30 DIAGNOSIS — I2510 Atherosclerotic heart disease of native coronary artery without angina pectoris: Secondary | ICD-10-CM

## 2022-07-30 MED ORDER — EZETIMIBE 10 MG PO TABS: 10.0000 mg | ORAL_TABLET | Freq: Every day | ORAL | 3 refills | Status: DC

## 2022-07-30 NOTE — Telephone Encounter (Signed)
Reviewed results and recommendations with patient. She verbalized understanding, agreeable with plan, and had no further questions at this time.

## 2022-07-30 NOTE — Telephone Encounter (Signed)
-----   Message from Theora Gianotti, NP sent at 07/29/2022  5:19 PM EST ----- See prev comment re: lytes/kidney function.  Lipids - LDL cholesterol higher than earlier this year, at 37.  I recommend starting zetia 10 mg daily with f/u lipids and lfts at time of blood chemistry in 1 month.

## 2022-07-30 NOTE — Telephone Encounter (Signed)
-----   Message from Theora Gianotti, NP sent at 07/29/2022  5:14 PM EST ----- Kidney function and potassium are stable.  Potassium remains on the high side of normal.  This will continue to require close monitoring.  Recommend f/u bmet in 1 month.

## 2022-08-18 ENCOUNTER — Other Ambulatory Visit: Payer: Self-pay | Admitting: Cardiovascular Disease

## 2022-08-23 ENCOUNTER — Ambulatory Visit: Payer: Medicare PPO | Attending: Nurse Practitioner

## 2022-08-23 DIAGNOSIS — I5022 Chronic systolic (congestive) heart failure: Secondary | ICD-10-CM | POA: Diagnosis not present

## 2022-08-23 LAB — ECHOCARDIOGRAM COMPLETE
AR max vel: 1.58 cm2
AV Area VTI: 1.64 cm2
AV Area mean vel: 1.71 cm2
AV Mean grad: 4 mmHg
AV Peak grad: 8 mmHg
Ao pk vel: 1.41 m/s
Area-P 1/2: 9.48 cm2
Calc EF: 51.9 %
S' Lateral: 2.7 cm
Single Plane A2C EF: 50.9 %
Single Plane A4C EF: 51.6 %

## 2022-08-30 ENCOUNTER — Other Ambulatory Visit
Admission: RE | Admit: 2022-08-30 | Discharge: 2022-08-30 | Disposition: A | Discharge status: Home / Self Care | Payer: Medicare PPO | Attending: Nurse Practitioner | Admitting: Nurse Practitioner

## 2022-08-30 ENCOUNTER — Other Ambulatory Visit: Payer: Self-pay | Admitting: *Deleted

## 2022-08-30 DIAGNOSIS — I5022 Chronic systolic (congestive) heart failure: Secondary | ICD-10-CM | POA: Diagnosis not present

## 2022-08-30 DIAGNOSIS — E875 Hyperkalemia: Secondary | ICD-10-CM

## 2022-08-30 DIAGNOSIS — E785 Hyperlipidemia, unspecified: Secondary | ICD-10-CM | POA: Diagnosis not present

## 2022-08-30 DIAGNOSIS — I255 Ischemic cardiomyopathy: Secondary | ICD-10-CM

## 2022-08-30 DIAGNOSIS — I2510 Atherosclerotic heart disease of native coronary artery without angina pectoris: Secondary | ICD-10-CM

## 2022-08-30 DIAGNOSIS — N183 Chronic kidney disease, stage 3 unspecified: Secondary | ICD-10-CM | POA: Diagnosis not present

## 2022-08-30 LAB — HEPATIC FUNCTION PANEL
ALT: 22 U/L (ref 0–44)
AST: 26 U/L (ref 15–41)
Albumin: 4.1 g/dL (ref 3.5–5.0)
Alkaline Phosphatase: 60 U/L (ref 38–126)
Bilirubin, Direct: <0.1 mg/dL (ref 0.0–0.2)
Total Bilirubin: 1 mg/dL (ref 0.3–1.2)
Total Protein: 7.5 g/dL (ref 6.5–8.1)

## 2022-08-30 LAB — LIPID PANEL
Cholesterol: 126 mg/dL (ref 0–200)
HDL: 52 mg/dL (ref >40)
LDL Cholesterol: 62 mg/dL (ref 0–99)
Total CHOL/HDL Ratio: 2.4 RATIO
Triglycerides: 62 mg/dL (ref <150)
VLDL: 12 mg/dL (ref 0–40)

## 2022-08-30 LAB — BASIC METABOLIC PANEL
Anion gap: 7 (ref 5–15)
BUN: 44 mg/dL — ABNORMAL HIGH (ref 8–23)
CO2: 24 mmol/L (ref 22–32)
Calcium: 9.6 mg/dL (ref 8.9–10.3)
Chloride: 109 mmol/L (ref 98–111)
Creatinine, Ser: 1.89 mg/dL — ABNORMAL HIGH (ref 0.44–1.00)
GFR, Estimated: 26 mL/min — ABNORMAL LOW (ref >60)
Glucose, Bld: 103 mg/dL — ABNORMAL HIGH (ref 70–99)
Potassium: 4.6 mmol/L (ref 3.5–5.1)
Sodium: 140 mmol/L (ref 135–145)

## 2022-08-30 MED ORDER — FUROSEMIDE 20 MG PO TABS: 20.0000 mg | ORAL_TABLET | Freq: Every day | ORAL | 0 refills | Status: DC | PRN

## 2022-09-26 ENCOUNTER — Other Ambulatory Visit
Admission: RE | Admit: 2022-09-26 | Discharge: 2022-09-26 | Disposition: A | Discharge status: Home / Self Care | Payer: Medicare PPO | Source: Ambulatory Visit | Attending: Nurse Practitioner | Admitting: Nurse Practitioner

## 2022-09-26 DIAGNOSIS — I5022 Chronic systolic (congestive) heart failure: Secondary | ICD-10-CM | POA: Insufficient documentation

## 2022-09-26 DIAGNOSIS — H5203 Hypermetropia, bilateral: Secondary | ICD-10-CM | POA: Diagnosis not present

## 2022-09-26 DIAGNOSIS — H2513 Age-related nuclear cataract, bilateral: Secondary | ICD-10-CM | POA: Diagnosis not present

## 2022-09-26 DIAGNOSIS — H25013 Cortical age-related cataract, bilateral: Secondary | ICD-10-CM | POA: Diagnosis not present

## 2022-09-26 LAB — BASIC METABOLIC PANEL
Anion gap: 6 (ref 5–15)
BUN: 32 mg/dL — ABNORMAL HIGH (ref 8–23)
CO2: 23 mmol/L (ref 22–32)
Calcium: 9.1 mg/dL (ref 8.9–10.3)
Chloride: 109 mmol/L (ref 98–111)
Creatinine, Ser: 1.66 mg/dL — ABNORMAL HIGH (ref 0.44–1.00)
GFR, Estimated: 30 mL/min — ABNORMAL LOW (ref >60)
Glucose, Bld: 99 mg/dL (ref 70–99)
Potassium: 5 mmol/L (ref 3.5–5.1)
Sodium: 138 mmol/L (ref 135–145)

## 2022-11-30 NOTE — Progress Notes (Unsigned)
Cardiology Office Note  Date:  12/02/2022   ID:  Katherine Brooks, DOB 02-04-1936, MRN [id number]  PCP:  Eugenia Pancoast, [place]   Chief Complaint  Patient presents with   4 month follow up     "Doing well." Medications reviewed by the patient verbally.     HPI:  Ms. Katherine Brooks is a 87 year old woman with past medical history of COVID January 2023 Hypertension Left bundle branch block CAD, hx of CABG April 2023 Chronic kidney disease GFR 28, dr.  Holley Raring ,nephrology Ischemic cardiomyopathy ejection fraction 30 to 35% After CABG with normalization of ejection fraction 50 to 55% Presenting for f/u of her dyspnea on exertion , cardiomyopathy ejection fraction 30 to 35%  Last seen by myself in clinic October 2023 Stress test April 2023 performed for low ejection fraction on echo and showed [place] of breath February 2023 showing anterior ischemia  Cardiac catheterization January 01, 2022 multivessel coronary disease Transferred to [facility] for CABG x4 Jan 07, 2022 Postoperatively transfusion 1 unit, hypotension discharge May 9  In follow-up today reports she is doing well, active, starting to do work in her garden Several small green houses  Denies significant leg swelling or shortness of breath, has not been using Lasix Blood pressure ranges typically 110-1 30 Occasionally with low blood pressure less than 100, in general is asymptomatic  On Coreg 3.125 twice daily losartan 25  Lipid panel at goal on Lipitor 80 with Zetia  EKG personally reviewed by myself on todays visit Normal sinus rhythm left bundle branch block rate 64 bpm  Other past medical history reviewed Worsening shortness of breath X-ray February 2023 with small bilateral pleural effusions, possible pulmonary edema  husband with medical issues, Possible Parkinson's symptoms, weakness, gait instability    PMH:   has a past medical history of Allergy, CAD (coronary artery disease), Cancer ([place]), Chronic HFrEF (heart failure  with reduced ejection fraction) ([place]), CKD (chronic kidney disease), stage III ([place]), COVID-19 virus infection (09/2021), Facial pain, Hyperlipidemia LDL goal <70, Hypertension, Ischemic cardiomyopathy, and LBBB (left bundle branch block).  PSH:    Past Surgical History:  Procedure Laterality Date   CORONARY ARTERY BYPASS GRAFT N/A 01/07/2022   Procedure: CORONARY ARTERY BYPASS GRAFTING (CABG) X FOUR BYPASSES USING OPEN LEFT INTERNAL MAMMARY ARTERY AND  ENDOSCOPIC RIGHT AND LEFT GREATER SAPHENOUS VEIN HARVEST.;  Surgeon: Gaye Pollack, MD;  Location: [place];  Service: Open Heart Surgery;  Laterality: N/A;   LEFT HEART CATH AND CORONARY ANGIOGRAPHY Left 01/01/2022   Procedure: LEFT HEART CATH AND CORONARY ANGIOGRAPHY;  Surgeon: Nelva Bush, MD;  Location: [place] [facility];  Service: Cardiovascular;  Laterality: Left;   salivary gland N/A 12/2012   TEE WITHOUT CARDIOVERSION N/A 01/07/2022   Procedure: TRANSESOPHAGEAL ECHOCARDIOGRAM (TEE);  Surgeon: Gaye Pollack, MD;  Location: [place];  Service: Open Heart Surgery;  Laterality: N/A;    Current Outpatient Medications  Medication Sig Dispense Refill   aspirin EC 81 MG tablet Take 1 tablet (81 mg total) by mouth daily. Swallow whole.     atorvastatin (LIPITOR) 80 MG tablet TAKE 1 TABLET EVERY DAY 90 tablet 1   carboxymethylcellul-glycerin (REFRESH RELIEVA) 0.5-0.9 % ophthalmic solution Place 1 drop into both eyes 2 (two) times a week.     carvedilol (COREG) 3.125 MG tablet Take 1 tablet (3.125 mg total) by mouth 2 (two) times daily. 180 tablet 3   Cholecalciferol (VITAMIN D3) 125 MCG (5000 UT) TABS Take 5,000 Units by mouth daily.  ferrous sulfate 324 MG TBEC Take 324 mg by mouth daily with breakfast.     furosemide (LASIX) 20 MG tablet Take 1 tablet (20 mg total) by mouth daily as needed for edema. 90 tablet 0   losartan (COZAAR) 25 MG tablet Take 1 tablet (25 mg total) by mouth daily. 90 tablet 3   MAGNESIUM PO Take 400 mg by mouth  daily.     Polyethylene Glycol 400 (BLINK TEARS OP) Place 1 drop into both eyes daily.     ezetimibe (ZETIA) 10 MG tablet Take 1 tablet (10 mg total) by mouth daily. 90 tablet 3   No current facility-administered medications for this visit.    Allergies:   Lisinopril and Carbamazepine   Social History:  The patient  reports that she has never smoked. She has never been exposed to tobacco smoke. She has never used smokeless tobacco. She reports that she does not drink alcohol and does not use drugs.   Family History:   family history includes COPD in her brother; Cancer in her sister; Heart disease in her mother; Kidney Stones in her sister; Lung disease in her father; Rheum arthritis in her sister.    Review of Systems: Review of Systems  Constitutional: Negative.   HENT: Negative.    Respiratory: Negative.    Cardiovascular: Negative.   Gastrointestinal: Negative.   Musculoskeletal: Negative.   Neurological: Negative.   Psychiatric/Behavioral: Negative.    All other systems reviewed and are negative.   PHYSICAL EXAM: VS:  BP 122/60 (BP Location: Left Arm, Patient Position: Sitting, Cuff Size: Normal)   Pulse 64   Ht 5' (1.524 m)   Wt 183 lb 4 oz (83.1 kg)   SpO2 98%   BMI 35.79 kg/m  , BMI Body mass index is 35.79 kg/m. Constitutional:  oriented to person, place, and time. No distress.  HENT:  Head: Grossly normal Eyes:  no discharge. No scleral icterus.  Neck: No JVD, no carotid bruits  Cardiovascular: Regular rate and rhythm, no murmurs appreciated Pulmonary/Chest: Clear to auscultation bilaterally, no wheezes or rails Abdominal: Soft.  no distension.  no tenderness.  Musculoskeletal: Normal range of motion Neurological:  normal muscle tone. Coordination normal. No atrophy Skin: Skin warm and dry Psychiatric: normal affect, pleasant  Recent Labs: 01/08/2022: Magnesium 2.7 02/01/2022: Hemoglobin 11.0; Platelets 443 08/30/2022: ALT 22 09/26/2022: BUN 32; Creatinine,  Ser 1.66; Potassium 5.0; Sodium 138    Lipid Panel Lab Results  Component Value Date   CHOL 126 08/30/2022   HDL 52 08/30/2022   LDLCALC 62 08/30/2022   TRIG 62 08/30/2022    Wt Readings from Last 3 Encounters:  12/02/22 183 lb 4 oz (83.1 kg)  07/17/22 177 lb (80.3 kg)  06/11/22 175 lb (79.4 kg)     ASSESSMENT AND PLAN:  Problem List Items Addressed This Visit       Cardiology Problems   CAD (coronary artery disease)     Other   Pedal edema   Other Visit Diagnoses     Chronic HFrEF (heart failure with reduced ejection fraction) (HCC)    -  Primary   Ischemic cardiomyopathy       Stage 3 chronic kidney disease, unspecified whether stage 3a or 3b CKD ([place])       Hyperlipidemia LDL goal <70       Essential hypertension         Dilated cardiomyopathy Ejection fraction 30 to 35% in February 2023 leading to stress test showing anterior  wall ischemia leading to cardiac catheterization showing multivessel disease [facility] Ejection fraction normalized after CABG Continue losartan 25 daily, carvedilol 3.125 twice daily,  Lasix 20 daily as needed for ankle swelling No significant changes made  Coronary artery disease with stable angina Hx of CABG, recovered well Ejection fraction normalized, denies anginal symptoms  Essential hypertension Blood pressure is well controlled on today's visit. No changes made to the medications.   chronic renal dysfunction Creatinine up to 1.6 followed by nephrology renal ultrasound 2022 Continue losartan 25 Will hold off on SGLT2 inhibitor  Pleural effusion Denies shortness of breath, no significant cough, lungs clear on exam Continue Lasix 20 daily as needed  Left bundle branch block Dates back to 2014 S/p CABG   Total encounter time more than 30 minutes  Greater than 50% was spent in counseling and coordination of care with the patient    Signed, Esmond Plants, M.D., Ph.D. [place],  [place]

## 2022-12-02 ENCOUNTER — Encounter: Payer: Self-pay | Admitting: Cardiovascular Disease

## 2022-12-02 ENCOUNTER — Ambulatory Visit: Payer: Medicare PPO | Attending: Cardiovascular Disease | Admitting: Cardiovascular Disease

## 2022-12-02 VITALS — BP 122/60 | HR 64 | Ht 60.0 in | Wt 183.2 lb

## 2022-12-02 DIAGNOSIS — I10 Essential (primary) hypertension: Secondary | ICD-10-CM | POA: Diagnosis not present

## 2022-12-02 DIAGNOSIS — I255 Ischemic cardiomyopathy: Secondary | ICD-10-CM

## 2022-12-02 DIAGNOSIS — N183 Chronic kidney disease, stage 3 unspecified: Secondary | ICD-10-CM | POA: Diagnosis not present

## 2022-12-02 DIAGNOSIS — E785 Hyperlipidemia, unspecified: Secondary | ICD-10-CM

## 2022-12-02 DIAGNOSIS — R600 Localized edema: Secondary | ICD-10-CM | POA: Diagnosis not present

## 2022-12-02 DIAGNOSIS — I2510 Atherosclerotic heart disease of native coronary artery without angina pectoris: Secondary | ICD-10-CM | POA: Diagnosis not present

## 2022-12-02 DIAGNOSIS — I5022 Chronic systolic (congestive) heart failure: Secondary | ICD-10-CM

## 2022-12-02 MED ORDER — LOSARTAN POTASSIUM 25 MG PO TABS: 25.0000 mg | ORAL_TABLET | Freq: Every day | ORAL | 3 refills | Status: DC

## 2022-12-02 MED ORDER — ATORVASTATIN CALCIUM 80 MG PO TABS: 80.0000 mg | ORAL_TABLET | Freq: Every day | ORAL | 1 refills | Status: DC

## 2022-12-02 MED ORDER — EZETIMIBE 10 MG PO TABS: 10.0000 mg | ORAL_TABLET | Freq: Every day | ORAL | 3 refills | Status: DC

## 2022-12-02 MED ORDER — CARVEDILOL 3.125 MG PO TABS: 3.1250 mg | ORAL_TABLET | Freq: Two times a day (BID) | ORAL | 3 refills | Status: DC

## 2022-12-02 NOTE — Patient Instructions (Signed)
Medication Instructions:  No changes  If you need a refill on your cardiac medications before your next appointment, please call your pharmacy.   Lab work: No new labs needed  Testing/Procedures: No new testing needed  Follow-Up: At [facility], you and your health needs are our priority.  As part of our continuing mission to provide you with exceptional heart care, we have created designated Provider Care Teams.  These Care Teams include your primary Cardiologist (physician) and Advanced Practice Providers (APPs -  Physician Assistants and Nurse Practitioners) who all work together to provide you with the care you need, when you need it.  You will need a follow up appointment in 12 months  Providers on your designated Care Team:   Christopher Berge, NP Ryan Dunn, PA-C Cadence Furth, PA-C  COVID-19 Vaccine Information can be found at: [email or web address] For questions related to vaccine distribution or appointments, please email [email or web address] or call [phone].   

## 2022-12-03 ENCOUNTER — Other Ambulatory Visit: Payer: Self-pay | Admitting: *Deleted

## 2022-12-03 MED ORDER — EZETIMIBE 10 MG PO TABS: 10.0000 mg | ORAL_TABLET | Freq: Every day | ORAL | 3 refills | Status: DC

## 2023-01-13 DIAGNOSIS — R600 Localized edema: Secondary | ICD-10-CM | POA: Diagnosis not present

## 2023-01-13 DIAGNOSIS — D631 Anemia in chronic kidney disease: Secondary | ICD-10-CM | POA: Diagnosis not present

## 2023-01-13 DIAGNOSIS — E875 Hyperkalemia: Secondary | ICD-10-CM | POA: Diagnosis not present

## 2023-01-13 DIAGNOSIS — N184 Chronic kidney disease, stage 4 (severe): Secondary | ICD-10-CM | POA: Diagnosis not present

## 2023-01-13 DIAGNOSIS — I10 Essential (primary) hypertension: Secondary | ICD-10-CM | POA: Diagnosis not present

## 2023-01-13 DIAGNOSIS — I129 Hypertensive chronic kidney disease with stage 1 through stage 4 chronic kidney disease, or unspecified chronic kidney disease: Secondary | ICD-10-CM | POA: Diagnosis not present

## 2023-01-13 DIAGNOSIS — N1832 Chronic kidney disease, stage 3b: Secondary | ICD-10-CM | POA: Diagnosis not present

## 2023-01-20 DIAGNOSIS — D631 Anemia in chronic kidney disease: Secondary | ICD-10-CM | POA: Diagnosis not present

## 2023-01-20 DIAGNOSIS — I129 Hypertensive chronic kidney disease with stage 1 through stage 4 chronic kidney disease, or unspecified chronic kidney disease: Secondary | ICD-10-CM | POA: Diagnosis not present

## 2023-01-20 DIAGNOSIS — E875 Hyperkalemia: Secondary | ICD-10-CM | POA: Diagnosis not present

## 2023-01-20 DIAGNOSIS — N1832 Chronic kidney disease, stage 3b: Secondary | ICD-10-CM | POA: Diagnosis not present

## 2023-01-20 DIAGNOSIS — R600 Localized edema: Secondary | ICD-10-CM | POA: Diagnosis not present

## 2023-02-20 DIAGNOSIS — E875 Hyperkalemia: Secondary | ICD-10-CM | POA: Diagnosis not present

## 2023-02-20 DIAGNOSIS — I129 Hypertensive chronic kidney disease with stage 1 through stage 4 chronic kidney disease, or unspecified chronic kidney disease: Secondary | ICD-10-CM | POA: Diagnosis not present

## 2023-02-20 DIAGNOSIS — R600 Localized edema: Secondary | ICD-10-CM | POA: Diagnosis not present

## 2023-02-20 DIAGNOSIS — N1832 Chronic kidney disease, stage 3b: Secondary | ICD-10-CM | POA: Diagnosis not present

## 2023-02-20 DIAGNOSIS — D631 Anemia in chronic kidney disease: Secondary | ICD-10-CM | POA: Diagnosis not present

## 2023-03-26 ENCOUNTER — Other Ambulatory Visit (HOSPITAL_BASED_OUTPATIENT_CLINIC_OR_DEPARTMENT_OTHER): Payer: Self-pay

## 2023-03-26 ENCOUNTER — Emergency Department (HOSPITAL_BASED_OUTPATIENT_CLINIC_OR_DEPARTMENT_OTHER)
Admission: EM | Admit: 2023-03-26 | Discharge: 2023-03-26 | Disposition: A | Discharge status: Home / Self Care | Payer: Medicare PPO | Attending: Emergency Medicine | Admitting: Emergency Medicine

## 2023-03-26 ENCOUNTER — Encounter (HOSPITAL_BASED_OUTPATIENT_CLINIC_OR_DEPARTMENT_OTHER): Payer: Self-pay

## 2023-03-26 ENCOUNTER — Telehealth: Payer: Self-pay | Admitting: Physician Assistant

## 2023-03-26 ENCOUNTER — Emergency Department (HOSPITAL_BASED_OUTPATIENT_CLINIC_OR_DEPARTMENT_OTHER): Payer: Medicare PPO

## 2023-03-26 ENCOUNTER — Other Ambulatory Visit: Payer: Self-pay

## 2023-03-26 DIAGNOSIS — K851 Biliary acute pancreatitis without necrosis or infection: Secondary | ICD-10-CM

## 2023-03-26 DIAGNOSIS — K8020 Calculus of gallbladder without cholecystitis without obstruction: Secondary | ICD-10-CM | POA: Insufficient documentation

## 2023-03-26 DIAGNOSIS — K429 Umbilical hernia without obstruction or gangrene: Secondary | ICD-10-CM | POA: Diagnosis not present

## 2023-03-26 DIAGNOSIS — K859 Acute pancreatitis without necrosis or infection, unspecified: Secondary | ICD-10-CM | POA: Insufficient documentation

## 2023-03-26 DIAGNOSIS — K654 Sclerosing mesenteritis: Secondary | ICD-10-CM | POA: Diagnosis not present

## 2023-03-26 DIAGNOSIS — R1013 Epigastric pain: Secondary | ICD-10-CM | POA: Diagnosis not present

## 2023-03-26 DIAGNOSIS — K449 Diaphragmatic hernia without obstruction or gangrene: Secondary | ICD-10-CM | POA: Diagnosis not present

## 2023-03-26 DIAGNOSIS — R112 Nausea with vomiting, unspecified: Secondary | ICD-10-CM | POA: Diagnosis present

## 2023-03-26 LAB — COMPREHENSIVE METABOLIC PANEL
ALT: 9 U/L (ref 0–44)
AST: 15 U/L (ref 15–41)
Albumin: 3.9 g/dL (ref 3.5–5.0)
Alkaline Phosphatase: 55 U/L (ref 38–126)
Anion gap: 9 (ref 5–15)
BUN: 25 mg/dL — ABNORMAL HIGH (ref 8–23)
CO2: 22 mmol/L (ref 22–32)
Calcium: 9.1 mg/dL (ref 8.9–10.3)
Chloride: 104 mmol/L (ref 98–111)
Creatinine, Ser: 1.83 mg/dL — ABNORMAL HIGH (ref 0.44–1.00)
GFR, Estimated: 27 mL/min — ABNORMAL LOW (ref >60)
Glucose, Bld: 108 mg/dL — ABNORMAL HIGH (ref 70–99)
Potassium: 4.4 mmol/L (ref 3.5–5.1)
Sodium: 135 mmol/L (ref 135–145)
Total Bilirubin: 0.8 mg/dL (ref 0.3–1.2)
Total Protein: 6.6 g/dL (ref 6.5–8.1)

## 2023-03-26 LAB — URINALYSIS, ROUTINE W REFLEX MICROSCOPIC
Bacteria, UA: NONE SEEN
Bilirubin Urine: NEGATIVE
Glucose, UA: NEGATIVE mg/dL
Ketones, ur: NEGATIVE mg/dL
Nitrite: NEGATIVE
Protein, ur: NEGATIVE mg/dL
Specific Gravity, Urine: 1.009 (ref 1.005–1.030)
pH: 5.5 (ref 5.0–8.0)

## 2023-03-26 LAB — LIPASE, BLOOD: Lipase: 1012 U/L — ABNORMAL HIGH (ref 11–51)

## 2023-03-26 LAB — CBC
HCT: 35.9 % — ABNORMAL LOW (ref 36.0–46.0)
Hemoglobin: 11.5 g/dL — ABNORMAL LOW (ref 12.0–15.0)
MCH: 28.2 pg (ref 26.0–34.0)
MCHC: 32 g/dL (ref 30.0–36.0)
MCV: 88 fL (ref 80.0–100.0)
Platelets: 187 10*3/uL (ref 150–400)
RBC: 4.08 MIL/uL (ref 3.87–5.11)
RDW: 14.9 % (ref 11.5–15.5)
WBC: 14.9 10*3/uL — ABNORMAL HIGH (ref 4.0–10.5)
nRBC: 0 % (ref 0.0–0.2)

## 2023-03-26 LAB — TROPONIN I (HIGH SENSITIVITY): Troponin I (High Sensitivity): 7 ng/L (ref <18)

## 2023-03-26 MED ORDER — MORPHINE SULFATE 15 MG PO TABS
7.5000 mg | ORAL_TABLET | ORAL | 0 refills | Status: DC | PRN
  Filled 2023-03-26: qty 7, 3d supply, fill #0

## 2023-03-26 MED ORDER — SODIUM CHLORIDE 0.9 % IV BOLUS
1000.0000 mL | Freq: Once | INTRAVENOUS | Status: AC
  Administered 2023-03-26: 1000 mL via INTRAVENOUS

## 2023-03-26 MED ORDER — ONDANSETRON 4 MG PO TBDP
ORAL_TABLET | ORAL | 0 refills | Status: DC
  Filled 2023-03-26: qty 20, 4d supply, fill #0

## 2023-03-26 MED ORDER — ONDANSETRON HCL 4 MG/2ML IJ SOLN
4.0000 mg | Freq: Once | INTRAMUSCULAR | Status: AC
  Administered 2023-03-26: 4 mg via INTRAVENOUS
  Filled 2023-03-26: qty 2

## 2023-03-26 MED ORDER — MORPHINE SULFATE (PF) 2 MG/ML IV SOLN
2.0000 mg | Freq: Once | INTRAVENOUS | Status: AC
  Administered 2023-03-26: 2 mg via INTRAVENOUS
  Filled 2023-03-26: qty 1

## 2023-03-26 NOTE — ED Notes (Signed)
Pt discharged in stable condition. Pt and daughter expressed understanding about discharge instructions and Rx, and to follow up with GI, and General Surgery, and to return to the ER for any further concerns or complications. Pt ambulated out with daughter, with even steady gait, no apparent distress.

## 2023-03-26 NOTE — ED Notes (Signed)
Patient transported to CT 

## 2023-03-26 NOTE — Progress Notes (Signed)
Celso Amy, PA-C [address]  [address]  [place], [place] [address]  Main: [phone]  Fax: [phone]   Gastroenterology Consultation  Referring Provider:     Mort Sawyers, FNP Primary Care Physician:  Mort Sawyers, FNP Primary Gastroenterologist:  Celso Amy, PA-C / Dr. Wyline Mood   Reason for Consultation:     Elevated lipase, acute pancreatitis        HPI:   Katherine Brooks is a 87 y.o. y/o female referred for consultation & management  by Mort Sawyers, FNP.  She is here to evaluate acute epigastric pain, elevated lipase, and acute pancreatitis (first ever episode).  She is here today with her daughter Clydie Braun.  2 days ago she awoke in the morning with severe acute epigastric pain 10 out of 10.  She had acute nausea, vomiting, and diarrhea.  Yesterday her pain continued in the epigastrium and spreads down her midline abdomen to the below the umbilicus.  [facility] 03/27/2023.  Lipase was moderately elevated at 1,012.  Her pain is better today 6 or 7 out of 10.  She has been on liquids for the past 12 hours.  No more nausea, vomiting, or diarrhea.  She denies any alcohol use.  No new medications.  No OTC herbal supplements or GLP meds.  She underwent CABG 01/2022 and is on Lipitor since then.  Abdominal pelvic CT 03/27/23 showed: 1. Mild fat stranding surrounding the pancreatic body/neck region with associated thickening of the left anterior perirenal fascia and left lateral conal fascia, likely secondary to acute pancreatitis. No walled-off abscess. 2. Mild diffuse edema along the mesenteric root along with multiple subcentimeter sized mesenteric lymph nodes, nonspecific but in the appropriate clinical setting can be seen with sclerosing mesenteritis. 3. Medium-sized sliding hiatal hernia. Herniation of fat through the bilateral posteromedial diaphragmatic hernia. There is mild fat stranding of the fat and trace amount of fluid in  the hernia sac, which may be secondary to acute pancreatitis. Correlate clinically for gastritis. 4. Hepatobiliary: The liver is normal in size. Non-cirrhotic configuration. No suspicious mass. No intrahepatic or extrahepatic bile duct dilation. There is a 3 mm calcified gallstone versus focal wall calcification. Normal gallbladder wall thickness. No pericholecystic inflammatory changes.   Past Medical History:  Diagnosis Date   Allergy    CAD (coronary artery disease)    a. 12/2021 MV: mid-dist ant, antsept ischemia; b. 12/2021 Cath: LM 60ost, 90d, LAD 90ost, 95p, 67m, D2 30, LCX 90p, RCA 90p; c. 12/2021 CABG x 4: LIMA->LAD, VG->OM, VG->Diag, VG->RPDA.   Cancer (HCC)    skin   Chronic HFrEF (heart failure with reduced ejection fraction) (HCC)    CKD (chronic kidney disease), stage III (HCC)    COVID-19 virus infection 09/2021   Facial pain    Hyperlipidemia LDL goal <70    Hypertension    Ischemic cardiomyopathy    a. 11/2021 Echo: EF 30-35%, glob HK, GrI DD, nl RV fxn, mildly dil LA, triv MR.   LBBB (left bundle branch block)     Past Surgical History:  Procedure Laterality Date   CORONARY ARTERY BYPASS GRAFT N/A 01/07/2022   Procedure: CORONARY ARTERY BYPASS GRAFTING (CABG) X FOUR BYPASSES USING OPEN LEFT INTERNAL MAMMARY ARTERY AND  ENDOSCOPIC RIGHT AND LEFT GREATER SAPHENOUS VEIN HARVEST.;  Surgeon: Alleen Borne, MD;  Location: [facility] OR;  Service: Open Heart Surgery;  Laterality: N/A;   LEFT HEART CATH AND CORONARY ANGIOGRAPHY Left 01/01/2022  Procedure: LEFT HEART CATH AND CORONARY ANGIOGRAPHY;  Surgeon: Yvonne Kendall, MD;  Location: [facility] INVASIVE CV LAB;  Service: Cardiovascular;  Laterality: Left;   salivary gland N/A 12/2012   TEE WITHOUT CARDIOVERSION N/A 01/07/2022   Procedure: TRANSESOPHAGEAL ECHOCARDIOGRAM (TEE);  Surgeon: Alleen Borne, MD;  Location: [facility] OR;  Service: Open Heart Surgery;  Laterality: N/A;    Prior to Admission medications   Medication Sig Start Date  End Date Taking? Authorizing Provider  atorvastatin (LIPITOR) 80 MG tablet Take 1 tablet (80 mg total) by mouth daily. 12/02/22   Antonieta Iba, MD  carboxymethylcellul-glycerin (REFRESH RELIEVA) 0.5-0.9 % ophthalmic solution Place 1 drop into both eyes 2 (two) times a week.    [provider]  carvedilol (COREG) 3.125 MG tablet Take 1 tablet (3.125 mg total) by mouth 2 (two) times daily. 12/02/22   Antonieta Iba, MD  Cholecalciferol (VITAMIN D3) 125 MCG (5000 UT) TABS Take 5,000 Units by mouth daily.    [provider]  ezetimibe (ZETIA) 10 MG tablet Take 1 tablet (10 mg total) by mouth daily. 12/03/22   Antonieta Iba, MD  ferrous sulfate 324 MG TBEC Take 324 mg by mouth daily with breakfast.    [provider]  furosemide (LASIX) 20 MG tablet Take 1 tablet (20 mg total) by mouth daily as needed for edema. 08/30/22   Creig Hines, NP  losartan (COZAAR) 25 MG tablet Take 1 tablet (25 mg total) by mouth daily. 12/02/22   Antonieta Iba, MD  MAGNESIUM PO Take 400 mg by mouth daily.    [provider]  morphine (MSIR) 15 MG tablet Take 0.5 tablets (7.5 mg total) by mouth every 4 (four) hours as needed for severe pain. 03/26/23   Melene Plan, DO  ondansetron (ZOFRAN-ODT) 4 MG disintegrating tablet dissolve 1 tablet by mouth every 4 hours as needed for  nausea/vomit 03/26/23   Melene Plan, DO  Polyethylene Glycol 400 (BLINK TEARS OP) Place 1 drop into both eyes daily.    [provider]    Family History  Problem Relation Age of Onset   Heart disease Mother    Lung disease Father    Cancer Sister        unknown primary   Rheum arthritis Sister    Kidney Stones Sister    COPD Brother      Social History   Tobacco Use   Smoking status: Never    Passive exposure: Never   Smokeless tobacco: Never  Vaping Use   Vaping status: Never Used  Substance Use Topics   Alcohol use: No   Drug use: No    Allergies as of 03/27/2023 -  Review Complete 03/27/2023  Allergen Reaction Noted   Lisinopril Other (See Comments) 10/01/2012   Carbamazepine Rash 07/16/2020    Review of Systems:    All systems reviewed and negative except where noted in HPI.   Physical Exam:  BP 126/73   Pulse 96   Temp 98.8 F (37.1 C)   Ht 5' (1.524 m)   Wt 182 lb 9.6 oz (82.8 kg)   BMI 35.66 kg/m  No LMP recorded. Patient is postmenopausal. Psych:  Alert and cooperative. Normal mood and affect. General:   Alert,  Well-developed, well-nourished, pleasant and cooperative in NAD Head:  Normocephalic and atraumatic. Eyes:  Sclera clear, no icterus.   Conjunctiva pink. Neck:  Supple; no masses or thyromegaly. Lungs:  Respirations even and unlabored.  Clear throughout to  auscultation.   No wheezes, crackles, or rhonchi. No acute distress. Heart:  Regular rate and rhythm; no murmurs, clicks, rubs, or gallops. Abdomen:  Normal bowel sounds.  No bruits.  Soft, and non-distended without masses, hepatosplenomegaly or hernias noted.  No Tenderness.  No guarding or rebound tenderness.    Neurologic:  Alert and oriented x3;  grossly normal neurologically. Psych:  Alert and cooperative. Normal mood and affect.  Imaging Studies: See HPI.  Assessment and Plan:   ANNALIZ SAVARY is a 87 y.o. y/o female has been referred for acute pancreatitis which started 2 days ago confirmed on CT with elevated lipase 1012.  [facility] and she declined hospitalization.  Abdominal pain has improved today.  No more nausea, vomiting, or diarrhea.  I am repeating lab work for follow-up.  Checking lipid panel and RUQ ultrasound to further evaluate for cholelithiasis or bile duct dilation.  ED precautions discussed.  She is advised to go to the ED if her abdominal pain worsens.  1.  Acute pancreatitis, uncertain etiology.  No alcohol use.  Evaluate for cholelithiasis.  Labs: Lipid panel, triglycerides, lipase, CMP.  Scheduling RUQ  ultrasound.  Continue liquid diet for 4 more days.  Recommend low-fat diet long-term.  She will need repeat abdominal pancreatic CT in 1 month.  Consider abdominal MRI/MRCP if symptoms worsen.  She is advised to go to the ED/hospital if symptoms worsen.  Follow up 4 weeks.  Celso Amy, PA-C

## 2023-03-26 NOTE — ED Provider Notes (Signed)
[place] EMERGENCY DEPARTMENT AT [facility] Provider Note   CSN: [id number] Arrival date & time: 03/26/23  6578     History  Chief Complaint  Patient presents with   Abdominal Pain    Katherine Brooks is a 87 y.o. female.  87 yo F with a chief complaints of nausea vomiting and diarrhea.  This started yesterday.  Says she has been throwing up and having diarrhea she feels like her stomach is sore especially to the epigastrium.  Has been describing what she thinks is reflux.  She has been really unable to keep anything down.  She called her daughter this morning who then encouraged her to come to the emergency department for evaluation.  She does have a history of coronary artery disease and is status post bypass surgery.  Also has a history of a hiatal hernia.  She denies any sick contacts denies recent travel denies suspicious food intake.  She has had multiple embedded ticks this season though denies any rash or myalgias.        Home Medications Prior to Admission medications   Medication Sig Start Date End Date Taking? Authorizing Provider  morphine (MSIR) 15 MG tablet Take 0.5 tablets (7.5 mg total) by mouth every 4 (four) hours as needed for severe pain. 03/26/23  Yes Melene Plan, DO  ondansetron (ZOFRAN-ODT) 4 MG disintegrating tablet dissolve 1 tablet by mouth every 4 hours as needed for  nausea/vomit 03/26/23  Yes Melene Plan, DO  atorvastatin (LIPITOR) 80 MG tablet Take 1 tablet (80 mg total) by mouth daily. 12/02/22   Antonieta Iba, MD  carboxymethylcellul-glycerin (REFRESH RELIEVA) 0.5-0.9 % ophthalmic solution Place 1 drop into both eyes 2 (two) times a week.    [provider]  carvedilol (COREG) 3.125 MG tablet Take 1 tablet (3.125 mg total) by mouth 2 (two) times daily. 12/02/22   Antonieta Iba, MD  Cholecalciferol (VITAMIN D3) 125 MCG (5000 UT) TABS Take 5,000 Units by mouth daily.    [provider]  ezetimibe (ZETIA) 10 MG tablet Take 1  tablet (10 mg total) by mouth daily. 12/03/22   Antonieta Iba, MD  ferrous sulfate 324 MG TBEC Take 324 mg by mouth daily with breakfast.    [provider]  furosemide (LASIX) 20 MG tablet Take 1 tablet (20 mg total) by mouth daily as needed for edema. 08/30/22   Creig Hines, NP  losartan (COZAAR) 25 MG tablet Take 1 tablet (25 mg total) by mouth daily. 12/02/22   Antonieta Iba, MD  MAGNESIUM PO Take 400 mg by mouth daily.    [provider]  Polyethylene Glycol 400 (BLINK TEARS OP) Place 1 drop into both eyes daily.    [provider]      Allergies    Lisinopril and Carbamazepine    Review of Systems   Review of Systems  Physical Exam Updated Vital Signs BP (!) 170/63 (BP Location: Right Arm)   Pulse 72   Temp 98.6 F (37 C) (Oral)   Resp 19   SpO2 99%  Physical Exam Vitals and nursing note reviewed.  Constitutional:      General: She is not in acute distress.    Appearance: She is well-developed. She is not diaphoretic.  HENT:     Head: Normocephalic and atraumatic.  Eyes:     Pupils: Pupils are equal, round, and reactive to light.  Cardiovascular:     Rate and Rhythm: Normal rate and  regular rhythm.     Heart sounds: No murmur heard.    No friction rub. No gallop.  Pulmonary:     Effort: Pulmonary effort is normal.     Breath sounds: No wheezing or rales.  Abdominal:     General: There is no distension.     Palpations: Abdomen is soft.     Tenderness: There is no abdominal tenderness.  Musculoskeletal:        General: No tenderness.     Cervical back: Normal range of motion and neck supple.  Skin:    General: Skin is warm and dry.  Neurological:     Mental Status: She is alert and oriented to person, place, and time.  Psychiatric:        Behavior: Behavior normal.     ED Results / Procedures / Treatments   Labs (all labs ordered are listed, but only abnormal results are displayed) Labs Reviewed  LIPASE,  BLOOD - Abnormal; Notable for the following components:      Result Value   Lipase 1,012 (*)    All other components within normal limits  COMPREHENSIVE METABOLIC PANEL - Abnormal; Notable for the following components:   Glucose, Bld 108 (*)    BUN 25 (*)    Creatinine, Ser 1.83 (*)    GFR, Estimated 27 (*)    All other components within normal limits  CBC - Abnormal; Notable for the following components:   WBC 14.9 (*)    Hemoglobin 11.5 (*)    HCT 35.9 (*)    All other components within normal limits  URINALYSIS, ROUTINE W REFLEX MICROSCOPIC - Abnormal; Notable for the following components:   Hgb urine dipstick TRACE (*)    Leukocytes,Ua SMALL (*)    All other components within normal limits  TROPONIN I (HIGH SENSITIVITY)    EKG EKG Interpretation Date/Time:  Wednesday March 26 2023 09:48:23 EDT Ventricular Rate:  83 PR Interval:  131 QRS Duration:  135 QT Interval:  389 QTC Calculation: 458 R Axis:   43  Text Interpretation: Sinus rhythm Left bundle branch block No significant change since last tracing Confirmed by Melene Plan [phone]) on 03/26/2023 9:55:57 AM  Radiology CT ABDOMEN PELVIS WO CONTRAST  Result Date: 03/26/2023 CLINICAL DATA:  Abdominal pain, acute, nonlocalized EXAM: CT ABDOMEN AND PELVIS WITHOUT CONTRAST TECHNIQUE: Multidetector CT imaging of the abdomen and pelvis was performed following the standard protocol without IV contrast. RADIATION DOSE REDUCTION: This exam was performed according to the departmental dose-optimization program which includes automated exposure control, adjustment of the mA and/or kV according to patient size and/or use of iterative reconstruction technique. COMPARISON:  None Available. FINDINGS: Lower chest: There are dependent and patchy atelectatic changes in the visualized lung bases. No overt consolidation. Trace bilateral pleural effusion. The heart is normal in size. No pericardial effusion. Hepatobiliary: The liver is normal in size.  Non-cirrhotic configuration. No suspicious mass. No intrahepatic or extrahepatic bile duct dilation. There is a 3 mm calcified gallstone versus focal wall calcification. Normal gallbladder wall thickness. No pericholecystic inflammatory changes. Pancreas: There is mild fat stranding surrounding the pancreatic body/neck region. No discrete pancreatic mass. Main pancreatic duct is nondilated. Spleen: Within normal limits. No focal lesion. Adrenals/Urinary Tract: Adrenal glands are unremarkable. No suspicious renal mass. There is a partially exophytic 1.2 x 1.6 cm cyst arising from the left kidney lower pole, laterally. No hydronephrosis. No renal or ureteric calculi. Unremarkable urinary bladder. Stomach/Bowel: There is a medium-sized sliding hiatal hernia.  There is also herniation of fat through the bilateral posteromedial diaphragmatic hernia. There is mild fat stranding of the fat and trace amount of fluid in the hernia sac, which may be secondary to acute pancreatitis. Correlate clinically for gastritis. No disproportionate dilation of the small or large bowel loops. No evidence of abnormal bowel wall thickening or inflammatory changes. The appendix is unremarkable. Vascular/Lymphatic: There is trace amount of ascites mainly in the left paracolic gutter with associated thickening of left anterior perirenal fascia and left lateral conal fascia, likely secondary to pancreatitis. No walled-off abscess. There is also mild diffuse edema along the mesenteric root along with multiple subcentimeter sized mesenteric lymph nodes, nonspecific but in the appropriate clinical setting can be seen with sclerosing mesenteritis. No pneumoperitoneum. No abdominal or pelvic lymphadenopathy, by size criteria. No aneurysmal dilation of the major abdominal arteries. There are moderate peripheral atherosclerotic vascular calcifications of the aorta and its major branches. Reproductive: The uterus is unremarkable. No large adnexal mass.  Other: There is a tiny fat containing umbilical hernia. The soft tissues and abdominal wall are otherwise unremarkable. Musculoskeletal: No suspicious osseous lesions. There are mild multilevel degenerative changes in the visualized spine. IMPRESSION: 1. Mild fat stranding surrounding the pancreatic body/neck region with associated thickening of the left anterior perirenal fascia and left lateral conal fascia, likely secondary to acute pancreatitis. No walled-off abscess. 2. Mild diffuse edema along the mesenteric root along with multiple subcentimeter sized mesenteric lymph nodes, nonspecific but in the appropriate clinical setting can be seen with sclerosing mesenteritis. 3. Medium-sized sliding hiatal hernia. Herniation of fat through the bilateral posteromedial diaphragmatic hernia. There is mild fat stranding of the fat and trace amount of fluid in the hernia sac, which may be secondary to acute pancreatitis. Correlate clinically for gastritis. 4. Multiple other nonacute observations, as described above. Aortic Atherosclerosis (ICD10-I70.0). Electronically Signed   By: Jules Schick M.D.   On: 03/26/2023 12:21    Procedures Procedures    Medications Ordered in ED Medications  morphine (PF) 2 MG/ML injection 2 mg (2 mg Intravenous Given 03/26/23 1000)  ondansetron (ZOFRAN) injection 4 mg (4 mg Intravenous Given 03/26/23 0959)  sodium chloride 0.9 % bolus 1,000 mL (0 mLs Intravenous Stopped 03/26/23 1108)    ED Course/ Medical Decision Making/ A&P                             Medical Decision Making Amount and/or Complexity of Data Reviewed Labs: ordered. Radiology: ordered. ECG/medicine tests: ordered.  Risk Prescription drug management.   87 yo F with a chief complaint of nausea vomiting diarrhea.  This been going on since yesterday.  She looks well.  Has no obvious abdominal discomfort.  Has a history of heart disease and is describing significant epigastric discomfort.  Not  reproducible on exam.  Will obtain screening troponin.  Treat pain and nausea.  Blood work.  CT scan of the abdomen pelvis.  Patient's blood work is concerning for pancreatitis, lipase over thousand.  LFTs are unremarkable.  She is feeling quite a bit better on repeat exam.  Her troponins negative EKG is unchanged.  CT scan also consistent with pancreatitis.  No obvious gallbladder pathology though there was thought that maybe there is a stone in the gallbladder.  I discussed the results with the patient.  We did discuss admission and what it would entail.  Patient is feeling much better and tolerating by mouth.  She would  like to try and go home at this time and agrees to return for worsening symptoms.  1:34 PM:  I have discussed the diagnosis/risks/treatment options with the patient and family.  Evaluation and diagnostic testing in the emergency department does not suggest an emergent condition requiring admission or immediate intervention beyond what has been performed at this time.  They will follow up with PCP. We also discussed returning to the ED immediately if new or worsening sx occur. We discussed the sx which are most concerning (e.g., sudden worsening pain, fever, inability to tolerate by mouth) that necessitate immediate return. Medications administered to the patient during their visit and any new prescriptions provided to the patient are listed below.  Medications given during this visit Medications  morphine (PF) 2 MG/ML injection 2 mg (2 mg Intravenous Given 03/26/23 1000)  ondansetron (ZOFRAN) injection 4 mg (4 mg Intravenous Given 03/26/23 0959)  sodium chloride 0.9 % bolus 1,000 mL (0 mLs Intravenous Stopped 03/26/23 1108)     The patient appears reasonably screen and/or stabilized for discharge and I doubt any other medical condition or other [facility] requiring further screening, evaluation, or treatment in the ED at this time prior to discharge.          Final Clinical  Impression(s) / ED Diagnoses Final diagnoses:  Pancreatitis, gallstone    Rx / DC Orders ED Discharge Orders          Ordered    morphine (MSIR) 15 MG tablet  Every 4 hours PRN        03/26/23 1324    ondansetron (ZOFRAN-ODT) 4 MG disintegrating tablet        03/26/23 1324              Melene Plan, DO 03/26/23 1334

## 2023-03-26 NOTE — Telephone Encounter (Signed)
Patient daughter Elease Hashimoto) called back to get their consult schedule with the PA.

## 2023-03-26 NOTE — Discharge Instructions (Addendum)
Your blood work and CT scan is consistent with acute pancreatitis.  As we discussed this is something that can be very severe.  If you have any worsening of your symptoms then please return to the emergency department for likely admission.  I have given you information on what you should try to eat.  Typically follow-up with a gastroenterologist after this.  Because there is concern for gallstones on your CT imaging, typically people also would have their gallbladder electively removed once you are feeling better.

## 2023-03-26 NOTE — ED Triage Notes (Signed)
She c/o epigastric pain, including lower chest which began yesterday morning. She tells [place] that the discomfort has moved inferiorly end currently involves mid abd. Only. She also reports a few diarrhea stools yesterday and a couple episodes of emesis. She states she has had no diarrhea nor emesis today.

## 2023-03-27 ENCOUNTER — Encounter: Payer: Self-pay | Admitting: Physician Assistant

## 2023-03-27 ENCOUNTER — Ambulatory Visit: Payer: Medicare PPO | Admitting: Physician Assistant

## 2023-03-27 ENCOUNTER — Telehealth: Payer: Self-pay | Admitting: Physician Assistant

## 2023-03-27 VITALS — BP 126/73 | HR 96 | Temp 98.8°F | Ht 60.0 in | Wt 182.6 lb

## 2023-03-27 DIAGNOSIS — K859 Acute pancreatitis without necrosis or infection, unspecified: Secondary | ICD-10-CM

## 2023-03-27 DIAGNOSIS — R748 Abnormal levels of other serum enzymes: Secondary | ICD-10-CM

## 2023-03-27 DIAGNOSIS — K8500 Idiopathic acute pancreatitis without necrosis or infection: Secondary | ICD-10-CM

## 2023-03-27 DIAGNOSIS — K8020 Calculus of gallbladder without cholecystitis without obstruction: Secondary | ICD-10-CM | POA: Diagnosis not present

## 2023-03-27 NOTE — Patient Instructions (Addendum)
Ultrasound scheduled 04/01/23 @10 :15 am @ [facility].  Nothing to eat/drink after midnight.

## 2023-03-27 NOTE — Telephone Encounter (Signed)
04/24/23 11:15 am

## 2023-03-28 LAB — LIPID PANEL WITH LDL/HDL RATIO
Cholesterol, Total: 123 mg/dL (ref 100–199)
HDL: 58 mg/dL (ref >39)
LDL Chol Calc (NIH): 53 mg/dL (ref 0–99)
LDL/HDL Ratio: 0.9 ratio (ref 0.0–3.2)
Triglycerides: 55 mg/dL (ref 0–149)
VLDL Cholesterol Cal: 12 mg/dL (ref 5–40)

## 2023-03-28 LAB — COMPREHENSIVE METABOLIC PANEL
ALT: 8 IU/L (ref 0–32)
AST: 15 IU/L (ref 0–40)
Albumin: 4 g/dL (ref 3.7–4.7)
Alkaline Phosphatase: 79 IU/L (ref 44–121)
BUN/Creatinine Ratio: 11 — ABNORMAL LOW (ref 12–28)
BUN: 20 mg/dL (ref 8–27)
Bilirubin Total: 0.7 mg/dL (ref 0.0–1.2)
CO2: 19 mmol/L — ABNORMAL LOW (ref 20–29)
Calcium: 9.1 mg/dL (ref 8.7–10.3)
Chloride: 100 mmol/L (ref 96–106)
Creatinine, Ser: 1.89 mg/dL — ABNORMAL HIGH (ref 0.57–1.00)
Globulin, Total: 2.1 g/dL (ref 1.5–4.5)
Glucose: 85 mg/dL (ref 70–99)
Potassium: 4.9 mmol/L (ref 3.5–5.2)
Sodium: 135 mmol/L (ref 134–144)
Total Protein: 6.1 g/dL (ref 6.0–8.5)
eGFR: 26 mL/min/{1.73_m2} — ABNORMAL LOW (ref >59)

## 2023-03-28 LAB — LIPASE: Lipase: 80 U/L (ref 14–85)

## 2023-03-28 NOTE — Progress Notes (Signed)
I called and notified pt. Of her results by phone: 1. Lipase pancreas test has greatly improved, down from 1,012 to 80. 2. Stable Chronic Kidney disease.  She is followed by nephrologist Dr. Thedore Mins. 3.  Normal liver tests and electrolytes. 4.  Normal Triglycerides and Cholesterol. She will c/w plan for RUQ [place] and f/u OV as scheduled.  She will call back if abd pain returns or worsens.

## 2023-03-31 ENCOUNTER — Telehealth: Payer: Self-pay | Admitting: Physician Assistant

## 2023-03-31 ENCOUNTER — Telehealth: Payer: Self-pay

## 2023-03-31 ENCOUNTER — Ambulatory Visit
Admission: RE | Admit: 2023-03-31 | Discharge: 2023-03-31 | Disposition: A | Discharge status: Home / Self Care | Payer: Medicare PPO | Source: Ambulatory Visit | Attending: Physician Assistant | Admitting: Physician Assistant

## 2023-03-31 ENCOUNTER — Other Ambulatory Visit: Payer: Self-pay | Admitting: Physician Assistant

## 2023-03-31 DIAGNOSIS — K838 Other specified diseases of biliary tract: Secondary | ICD-10-CM

## 2023-03-31 DIAGNOSIS — K8500 Idiopathic acute pancreatitis without necrosis or infection: Secondary | ICD-10-CM | POA: Diagnosis not present

## 2023-03-31 DIAGNOSIS — K8020 Calculus of gallbladder without cholecystitis without obstruction: Secondary | ICD-10-CM | POA: Diagnosis not present

## 2023-03-31 DIAGNOSIS — K859 Acute pancreatitis without necrosis or infection, unspecified: Secondary | ICD-10-CM | POA: Diagnosis not present

## 2023-03-31 NOTE — Telephone Encounter (Signed)
Patient notified.  MRI/MRCP scheduled 04/04/23@ 11:15 [facility].  Nothing to eat/drink 4 hours prior.   Call and notify pt. RUQ Ultrasound shows a small 4mm gallstone in the gallbladder.  No evidence of infection.  Liver is normal.  Common bile duct is promenent 7mm.  **Please order abdominal MRI / MRCP to evaluate for bile duct stone and f/u with recent Pancreatitis.

## 2023-03-31 NOTE — Progress Notes (Signed)
Call and notify pt. RUQ Ultrasound shows a small 4mm gallstone in the gallbladder.  No evidence of infection.  Liver is normal.  Common bile duct is promenent 7mm.  **Please order abdominal MRI / MRCP to evaluate for bile duct stone and f/u with recent Pancreatitis.

## 2023-03-31 NOTE — Telephone Encounter (Signed)
PT would like a call back to get ultrasound results

## 2023-04-01 ENCOUNTER — Ambulatory Visit
Admission: RE | Admit: 2023-04-01 | Discharge: 2023-04-01 | Disposition: A | Discharge status: Home / Self Care | Payer: Medicare PPO | Source: Ambulatory Visit | Attending: Physician Assistant | Admitting: Physician Assistant

## 2023-04-01 ENCOUNTER — Ambulatory Visit: Payer: Medicare PPO

## 2023-04-01 DIAGNOSIS — K838 Other specified diseases of biliary tract: Secondary | ICD-10-CM | POA: Insufficient documentation

## 2023-04-01 DIAGNOSIS — K8020 Calculus of gallbladder without cholecystitis without obstruction: Secondary | ICD-10-CM | POA: Diagnosis not present

## 2023-04-01 DIAGNOSIS — N281 Cyst of kidney, acquired: Secondary | ICD-10-CM | POA: Diagnosis not present

## 2023-04-01 DIAGNOSIS — K858 Other acute pancreatitis without necrosis or infection: Secondary | ICD-10-CM | POA: Diagnosis not present

## 2023-04-01 DIAGNOSIS — R109 Unspecified abdominal pain: Secondary | ICD-10-CM | POA: Diagnosis not present

## 2023-04-01 MED ORDER — GADOBUTROL 1 MMOL/ML IV SOLN
8.0000 mL | Freq: Once | INTRAVENOUS | Status: AC | PRN
  Administered 2023-04-01: 8 mL via INTRAVENOUS

## 2023-04-03 ENCOUNTER — Telehealth: Payer: Medicare PPO | Admitting: Nurse Practitioner

## 2023-04-03 DIAGNOSIS — U071 COVID-19: Secondary | ICD-10-CM

## 2023-04-03 MED ORDER — MOLNUPIRAVIR EUA 200MG CAPSULE
4.0000 | ORAL_CAPSULE | Freq: Two times a day (BID) | ORAL | 0 refills | Status: AC
Start: 2023-04-03 — End: 2023-04-08

## 2023-04-03 NOTE — Progress Notes (Signed)
Virtual Visit Consent   Katherine Brooks, you are scheduled for a virtual visit with a [facility] provider today. Just as with appointments in the office, your consent must be obtained to participate. Your consent will be active for this visit and any virtual visit you may have with one of our providers in the next 365 days. If you have a MyChart account, a copy of this consent can be sent to you electronically.  As this is a virtual visit, video technology does not allow for your provider to perform a traditional examination. This may limit your provider's ability to fully assess your condition. If your provider identifies any concerns that need to be evaluated in person or the need to arrange testing (such as labs, EKG, etc.), we will make arrangements to do so. Although advances in technology are sophisticated, we cannot ensure that it will always work on either your end or our end. If the connection with a video visit is poor, the visit may have to be switched to a telephone visit. With either a video or telephone visit, we are not always able to ensure that we have a secure connection.  By engaging in this virtual visit, you consent to the provision of healthcare and authorize for your insurance to be billed (if applicable) for the services provided during this visit. Depending on your insurance coverage, you may receive a charge related to this service.  I need to obtain your verbal consent now. Are you willing to proceed with your visit today? Katherine Brooks has provided verbal consent on 04/03/2023 for a virtual visit (video or telephone). Viviano Simas, FNP  Date: 04/03/2023 11:36 AM  Virtual Visit via Video Note   I, Viviano Simas, connected with  Katherine Brooks  ([id number], [birth year]) on 04/03/23 at 11:45 AM EDT by a video-enabled telemedicine application and verified that I am speaking with the correct person using two identifiers.  Location: Patient: Virtual Visit Location Patient:  Home Provider: Virtual Visit Location Provider: Home Office   I discussed the limitations of evaluation and management by telemedicine and the availability of in person appointments. The patient expressed understanding and agreed to proceed.    History of Present Illness: Katherine Brooks is a 87 y.o. who identifies as a female who was assigned female at birth, and is being seen today after testing positive for COVID   She started to feel sick with runny nose, cough and fever that started yesterday  She took a home COVID test today and it was positive   She has had COVID in the past  She had a mild course last time and did use Paxlovid  Denies any SE from anti-viral   She has been vaccinated for COVID without a recent booster     Problems:  Patient Active Problem List   Diagnosis Date Noted   S/P CABG x 4 01/08/2022   HFrEF (heart failure with reduced ejection fraction) (HCC) 01/01/2022   CAD (coronary artery disease) 01/01/2022   Anemia in stage 4 chronic kidney disease (HCC) 12/31/2021   Grade I diastolic dysfunction 11/08/2021   Allergic rhinitis 11/08/2021   Left bundle branch block 11/06/2021   DOE (dyspnea on exertion) 11/06/2021   Pedal edema 11/06/2021   Other fatigue 11/06/2021   Estrogen deficiency 02/26/2021   Chronic kidney disease (CKD) stage G4/A1, severely decreased glomerular filtration rate (GFR) between 15-29 mL/min/1.73 square meter and albuminuria creatinine ratio less than 30 mg/g (HCC) 10/30/2020   Dysgeusia  08/30/2020   Obesity (BMI 35.0-39.9 without comorbidity) 03/20/2018   Hypokalemia 03/15/2018   Renal insufficiency, mild 02/18/2011   Osteoporosis 02/06/2011   Hyperlipidemia 10/17/2010    Allergies:  Allergies  Allergen Reactions   Lisinopril Other (See Comments)    "passed out" Other reaction(s): Other (See Comments), Other (See Comments) "passed out" "I passed out"   Carbamazepine Rash   Medications:  Current Outpatient Medications:     atorvastatin (LIPITOR) 80 MG tablet, Take 1 tablet (80 mg total) by mouth daily., Disp: 90 tablet, Rfl: 1   carvedilol (COREG) 3.125 MG tablet, Take 1 tablet (3.125 mg total) by mouth 2 (two) times daily., Disp: 180 tablet, Rfl: 3   Cholecalciferol (VITAMIN D3) 125 MCG (5000 UT) TABS, Take 5,000 Units by mouth daily., Disp: , Rfl:    ezetimibe (ZETIA) 10 MG tablet, Take 1 tablet (10 mg total) by mouth daily., Disp: 90 tablet, Rfl: 3   ferrous sulfate 324 MG TBEC, Take 324 mg by mouth daily with breakfast., Disp: , Rfl:    furosemide (LASIX) 20 MG tablet, Take 1 tablet (20 mg total) by mouth daily as needed for edema., Disp: 90 tablet, Rfl: 0   losartan (COZAAR) 25 MG tablet, Take 1 tablet (25 mg total) by mouth daily., Disp: 90 tablet, Rfl: 3   MAGNESIUM PO, Take 400 mg by mouth daily., Disp: , Rfl:    morphine (MSIR) 15 MG tablet, Take 0.5 tablets (7.5 mg total) by mouth every 4 (four) hours as needed for severe pain., Disp: 7 tablet, Rfl: 0   ondansetron (ZOFRAN-ODT) 4 MG disintegrating tablet, dissolve 1 tablet by mouth every 4 hours as needed for  nausea/vomit, Disp: 20 tablet, Rfl: 0  Observations/Objective: Patient is well-developed, well-nourished in no acute distress.  Resting comfortably  at home.  Head is normocephalic, atraumatic.  No labored breathing.  Speech is clear and coherent with logical content.  Patient is alert and oriented at baseline.    Assessment and Plan: 1. COVID-19  - molnupiravir EUA (LAGEVRIO) 200 mg CAPS capsule; Take 4 capsules (800 mg total) by mouth 2 (two) times daily for 5 days.  Dispense: 40 capsule; Refill: 0    Discussed safe medications to use over the counter like Coricidin HBP, saline nasal spray  Assure hydration and eat small protein meals throughout the day    Follow Up Instructions: I discussed the assessment and treatment plan with the patient. The patient was provided an opportunity to ask questions and all were answered. The patient  agreed with the plan and demonstrated an understanding of the instructions.  A copy of instructions were sent to the patient via MyChart unless otherwise noted below.    The patient was advised to call back or seek an in-person evaluation if the symptoms worsen or if the condition fails to improve as anticipated.  Time:  I spent 11 minutes with the patient via telehealth technology discussing the above problems/concerns.    Viviano Simas, FNP

## 2023-04-04 ENCOUNTER — Ambulatory Visit: Payer: Medicare PPO

## 2023-04-04 ENCOUNTER — Telehealth: Payer: Self-pay

## 2023-04-04 NOTE — Telephone Encounter (Signed)
Transition Care Management Unsuccessful Follow-up Telephone Call  Date of discharge and from where:  [facility] 7/17  Attempts:  2nd Attempt  Reason for unsuccessful TCM follow-up call:  No answer/busy   Katherine Brooks [facility] Guide, [facility] [phone] [address] [place], [place], [place] [address] Phone: [phone] Email: [email or web address]

## 2023-04-07 ENCOUNTER — Telehealth: Payer: Self-pay

## 2023-04-07 NOTE — Telephone Encounter (Signed)
Transition Care Management Follow-up Telephone Call Date of discharge and from where: [facility] How have you been since you were released from the hospital? Doing but now has covid Any questions or concerns? No  Items Reviewed: Did the pt receive and understand the discharge instructions provided? Yes  Medications obtained and verified? Yes Other? No  Any new allergies since your discharge? No  Dietary orders reviewed? No Do you have support at home? Yes    Follow up appointments reviewed:  PCP Hospital f/u appt confirmed? Yes  Scheduled to see PCP on  @ . Specialist Hospital f/u appt confirmed? No  Scheduled to see  on  @ . Are transportation arrangements needed? No  If their condition worsens, is the pt aware to call PCP or go to the Emergency Dept.? Yes Was the patient provided with contact information for the PCP's office or ED? Yes Was to pt encouraged to call back with questions or concerns? Yes

## 2023-04-09 ENCOUNTER — Encounter (INDEPENDENT_AMBULATORY_CARE_PROVIDER_SITE_OTHER): Payer: Self-pay

## 2023-04-09 ENCOUNTER — Encounter: Payer: Self-pay | Admitting: Physician Assistant

## 2023-04-09 ENCOUNTER — Telehealth: Payer: Self-pay

## 2023-04-09 DIAGNOSIS — K859 Acute pancreatitis without necrosis or infection, unspecified: Secondary | ICD-10-CM

## 2023-04-09 NOTE — Telephone Encounter (Signed)
Error

## 2023-04-09 NOTE — Telephone Encounter (Signed)
Called Radiology for MRCP results-I was told there is a shortage of radiologist and could take several weeks for the results-I left message with patient;'s  husband and told him to let her know I will call as soon as we get the final results.

## 2023-04-09 NOTE — Progress Notes (Signed)
Notify patient abdominal MRI/MRCP shows: 1.  No evidence of bile duct stone or bile duct dilation.  No evidence of gallstone or acute gallbladder infection. 2.  Persistent moderate acute pancreatitis. 3.  There is concern of possible early developing pancreatic cyst.  Recommend repeat abdominal CT with contrast in 7 to 14 days.  Dx: Pancreatitis

## 2023-04-10 ENCOUNTER — Telehealth: Payer: Self-pay

## 2023-04-10 ENCOUNTER — Encounter: Payer: Self-pay | Admitting: Cardiovascular Disease

## 2023-04-10 NOTE — Progress Notes (Signed)
Notify patient abdominal MRI/MRCP shows:  1.  No evidence of bile duct stone or bile duct dilation.  No evidence of gallstone or acute gallbladder infection.  2.  Persistent moderate acute pancreatitis.  3.  There is concern of possible early developing pancreatic cyst.  Recommend repeat abdominal CT with contrast in 7 to 14 days.  Dx: Pancreatitis  Celso Amy, PA-C

## 2023-04-10 NOTE — Telephone Encounter (Signed)
CT abdomen scheduled 04/28/23 [facility] 04/28/23 @ 10:00 arrival.   Patient aware.   Notify patient abdominal MRI/MRCP shows:  1.  No evidence of bile duct stone or bile duct dilation.  No evidence of gallstone or acute gallbladder infection.  2.  Persistent moderate acute pancreatitis.  3.  There is concern of possible early developing pancreatic cyst.  Recommend repeat abdominal CT with contrast in 7 to 14 days.  Dx: Pancreatitis

## 2023-04-14 ENCOUNTER — Ambulatory Visit: Payer: Medicare PPO

## 2023-04-15 ENCOUNTER — Encounter: Payer: Self-pay | Admitting: Cardiovascular Disease

## 2023-04-23 ENCOUNTER — Ambulatory Visit (INDEPENDENT_AMBULATORY_CARE_PROVIDER_SITE_OTHER): Payer: Medicare PPO

## 2023-04-23 VITALS — BP 113/71 | HR 69 | Ht 60.0 in | Wt 169.4 lb

## 2023-04-23 DIAGNOSIS — Z0000 Encounter for general adult medical examination without abnormal findings: Secondary | ICD-10-CM

## 2023-04-23 NOTE — Progress Notes (Signed)
Subjective:   Katherine Brooks is a 87 y.o. female who presents for Medicare Annual (Subsequent) preventive examination.  Visit Complete: Virtual  I connected with  Katherine Brooks on 04/23/23 by a audio enabled telemedicine application and verified that I am speaking with the correct person using two identifiers.  Patient Location: Home  Provider Location: Office/Clinic  I discussed the limitations of evaluation and management by telemedicine. The patient expressed understanding and agreed to proceed.  Patient Medicare AWV questionnaire was completed by the patient on 04/22/23; I have confirmed that all information answered by patient is correct and no changes since this date.  Vital Signs: Vital signs are patient reported.   Review of Systems      Cardiac Risk Factors include: advanced age (>29men, >40 women);sedentary lifestyle;obesity (BMI >30kg/m2);dyslipidemia     Objective:    Today's Vitals   04/23/23 1016  BP: 113/71  Pulse: 69  Weight: 169 lb 6.4 oz (76.8 kg)  Height: 5' (1.524 m)   Body mass index is 33.08 kg/m.     04/23/2023   10:29 AM 03/26/2023    9:59 AM 02/15/2022    9:29 AM 01/01/2022    6:00 PM 01/01/2022    7:41 AM 12/31/2021    9:09 AM 06/12/2021   12:49 PM  Advanced Directives  Does Patient Have a Medical Advance Directive? Yes No Yes Yes Yes Yes Yes  Type of Estate agent of [place];Living will  Healthcare Power of [place];Living will Healthcare Power of [employer] Power of [employer] Power of Attorney Living will;Healthcare Power of Attorney  Does patient want to make changes to medical advance directive?    No - Patient declined     Copy of Healthcare Power of Attorney in Chart? No - copy requested  No - copy requested      Would patient like information on creating a medical advance directive?  No - Patient declined         Current Medications (verified) Outpatient Encounter Medications as of 04/23/2023   Medication Sig   atorvastatin (LIPITOR) 80 MG tablet Take 1 tablet (80 mg total) by mouth daily.   carvedilol (COREG) 3.125 MG tablet Take 1 tablet (3.125 mg total) by mouth 2 (two) times daily.   Cholecalciferol (VITAMIN D3) 125 MCG (5000 UT) TABS Take 5,000 Units by mouth daily.   ezetimibe (ZETIA) 10 MG tablet Take 1 tablet (10 mg total) by mouth daily.   ferrous sulfate 324 MG TBEC Take 324 mg by mouth daily with breakfast.   furosemide (LASIX) 20 MG tablet Take 1 tablet (20 mg total) by mouth daily as needed for edema.   losartan (COZAAR) 25 MG tablet Take 1 tablet (25 mg total) by mouth daily.   MAGNESIUM PO Take 400 mg by mouth daily.   morphine (MSIR) 15 MG tablet Take 0.5 tablets (7.5 mg total) by mouth every 4 (four) hours as needed for severe pain.   ondansetron (ZOFRAN-ODT) 4 MG disintegrating tablet dissolve 1 tablet by mouth every 4 hours as needed for  nausea/vomit   No facility-administered encounter medications on file as of 04/23/2023.    Allergies (verified) Lisinopril and Carbamazepine   History: Past Medical History:  Diagnosis Date   Allergy    CAD (coronary artery disease)    a. 12/2021 MV: mid-dist ant, antsept ischemia; b. 12/2021 Cath: LM 60ost, 90d, LAD 90ost, 95p, 9m, D2 30, LCX 90p, RCA 90p; c. 12/2021 CABG x 4: LIMA->LAD, VG->OM, VG->Diag,  VG->RPDA.   Cancer (HCC)    skin   Chronic HFrEF (heart failure with reduced ejection fraction) (HCC)    CKD (chronic kidney disease), stage III (HCC)    COVID-19 virus infection 09/2021   Facial pain    Hyperlipidemia LDL goal <70    Hypertension    Ischemic cardiomyopathy    a. 11/2021 Echo: EF 30-35%, glob HK, GrI DD, nl RV fxn, mildly dil LA, triv MR.   LBBB (left bundle branch block)    Past Surgical History:  Procedure Laterality Date   CORONARY ARTERY BYPASS GRAFT N/A 01/07/2022   Procedure: CORONARY ARTERY BYPASS GRAFTING (CABG) X FOUR BYPASSES USING OPEN LEFT INTERNAL MAMMARY ARTERY AND  ENDOSCOPIC RIGHT AND  LEFT GREATER SAPHENOUS VEIN HARVEST.;  Surgeon: Alleen Borne, MD;  Location: [facility] OR;  Service: Open Heart Surgery;  Laterality: N/A;   LEFT HEART CATH AND CORONARY ANGIOGRAPHY Left 01/01/2022   Procedure: LEFT HEART CATH AND CORONARY ANGIOGRAPHY;  Surgeon: Yvonne Kendall, MD;  Location: [facility] INVASIVE CV LAB;  Service: Cardiovascular;  Laterality: Left;   salivary gland N/A 12/2012   TEE WITHOUT CARDIOVERSION N/A 01/07/2022   Procedure: TRANSESOPHAGEAL ECHOCARDIOGRAM (TEE);  Surgeon: Alleen Borne, MD;  Location: [facility];  Service: Open Heart Surgery;  Laterality: N/A;   Family History  Problem Relation Age of Onset   Heart disease Mother    Lung disease Father    Cancer Sister        unknown primary   Rheum arthritis Sister    Kidney Stones Sister    COPD Brother    Social History   Socioeconomic History   Marital status: Married    Spouse name: Jake Shark   Number of children: 3   Years of education: Not on file   Highest education level: Some college, no degree  Occupational History    Comment: Diplomatic Services operational officer, school system  Tobacco Use   Smoking status: Never    Passive exposure: Never   Smokeless tobacco: Never  Vaping Use   Vaping status: Never Used  Substance and Sexual Activity   Alcohol use: No   Drug use: No   Sexual activity: Not Currently  Other Topics Concern   Not on file  Social History Narrative   Lives with husband   Social Determinants of Health   Financial Resource Strain: Low Risk  (04/22/2023)   Overall Financial Resource Strain (CARDIA)    Difficulty of Paying Living Expenses: Not hard at all  Food Insecurity: No Food Insecurity (04/22/2023)   Hunger Vital Sign    Worried About Running Out of Food in the Last Year: Never true    Ran Out of Food in the Last Year: Never true  Transportation Needs: No Transportation Needs (04/22/2023)   PRAPARE - Administrator, Civil Service (Medical): No    Lack of Transportation (Non-Medical): No  Physical  Activity: Inactive (04/22/2023)   Exercise Vital Sign    Days of Exercise per Week: 0 days    Minutes of Exercise per Session: 0 min  Stress: No Stress Concern Present (04/22/2023)   [employer] of Occupational Health - Occupational Stress Questionnaire    Feeling of Stress : Not at all  Social Connections: Moderately Integrated (04/22/2023)   Social Connection and Isolation Panel [NHANES]    Frequency of Communication with Friends and Family: More than three times a week    Frequency of Social Gatherings with Friends and Family: Three times a week  Attends Religious Services: Never    Active Member of Clubs or Organizations: Yes    Attends Engineer, structural: More than 4 times per year    Marital Status: Married    Tobacco Counseling Counseling given: Not Answered   Clinical Intake:  Pre-visit preparation completed: Yes  Pain : No/denies pain     BMI - recorded: 33.08 Nutritional Status: BMI > 30  Obese Nutritional Risks: Nausea/ vomitting/ diarrhea (currently being treated gallbladder/pancreas issue) Diabetes: No  How often do you need to have someone help you when you read instructions, pamphlets, or other written materials from your doctor or pharmacy?: 1 - Never  Interpreter Needed?: No  Information entered by :: C. LPN   Activities of Daily Living    04/22/2023   12:51 AM  In your present state of health, do you have any difficulty performing the following activities:  Hearing? 0  Vision? 0  Difficulty concentrating or making decisions? 0  Walking or climbing stairs? 0  Dressing or bathing? 0  Doing errands, shopping? 0  Preparing Food and eating ? N  Using the Toilet? N  In the past six months, have you accidently leaked urine? N  Do you have problems with loss of bowel control? N  Managing your Medications? N  Managing your Finances? N  Housekeeping or managing your Housekeeping? N    Patient Care Team: Mort Sawyers, FNP  as PCP - General (Family Medicine) Antonieta Iba, MD as PCP - Cardiology (Cardiology)  Indicate any recent Medical Services you may have received from other than [facility] providers in the past year (date may be approximate).     Assessment:   This is a routine wellness examination for Cait.  Hearing/Vision screen Hearing Screening - Comments:: Denies hearing difficulties   Vision Screening - Comments:: Contacts -  Ophthalmology - UTD on eye exams.  Dietary issues and exercise activities discussed:     Goals Addressed             This Visit's Progress    Patient Stated       Lose weight       Depression Screen    04/23/2023   10:22 AM 02/15/2022    9:30 AM 11/08/2020    1:12 PM 07/12/2020    3:55 PM 07/07/2019   10:31 AM 07/05/2019   11:18 AM 07/01/2018    9:41 AM  PHQ 2/9 Scores  PHQ - 2 Score 0 0 0 0 0 0 0  PHQ- 9 Score   0 3 0 0     Fall Risk    04/22/2023   12:51 AM 02/15/2022    9:30 AM 02/12/2022    4:00 PM 11/08/2020    1:11 PM 07/12/2020    3:55 PM  Fall Risk   Falls in the past year? 0 0 0 0 1  Number falls in past yr: 0 0  0 0  Injury with Fall? 0 0 0 0 0  Risk for fall due to : No Fall Risks Medication side effect  Medication side effect   Follow up Falls prevention discussed;Falls evaluation completed Falls evaluation completed;Education provided;Falls prevention discussed  Falls evaluation completed;Falls prevention discussed     MEDICARE RISK AT HOME:   TIMED UP AND GO:  Was the test performed?  No    Cognitive Function:    11/08/2020    1:13 PM 07/05/2019   11:21 AM  MMSE - Mini Mental State Exam  Not completed:  Refused   Orientation to time  5  Orientation to Place  5  Registration  3  Attention/ Calculation  5  Recall  3  Language- repeat  1        04/23/2023   10:31 AM 02/15/2022    9:31 AM  6CIT Screen  What Year? 0 points 0 points  What month? 0 points 0 points  What time? 0 points 0 points  Count back from 20 0  points 0 points  Months in reverse 0 points 0 points  Repeat phrase 0 points 0 points  Total Score 0 points 0 points    Immunizations Immunization History  Administered Date(s) Administered   Fluad Quad(high Dose 65+) 07/12/2020   Influenza Split 06/25/2017   Influenza, High Dose Seasonal PF 06/13/2016, 05/09/2018   Influenza-Unspecified 07/10/2013, 06/05/2015, 06/13/2016, 07/20/2021   PFIZER(Purple Top)SARS-COV-2 Vaccination 09/21/2019, 10/11/2019   Pneumococcal Conjugate-13 03/01/2016   Pneumococcal Polysaccharide-23 03/20/2018   Td 01/14/2002   Tdap 06/12/2021   Zoster Recombinant(Shingrix) 05/09/2018, 07/15/2018    TDAP status: Up to date  Flu Vaccine status: Due, Education has been provided regarding the importance of this vaccine. Advised may receive this vaccine at local pharmacy or Health Dept. Aware to provide a copy of the vaccination record if obtained from local pharmacy or Health Dept. Verbalized acceptance and understanding.  Pneumococcal vaccine status: Up to date  Covid-19 vaccine status: Information provided on how to obtain vaccines.   Qualifies for Shingles Vaccine? Yes   Zostavax completed No   Shingrix Completed?: Yes  Screening Tests Health Maintenance  Topic Date Due   COVID-19 Vaccine (3 - Pfizer risk series) 11/08/2019   INFLUENZA VACCINE  04/10/2023   Medicare Annual Wellness (AWV)  04/22/2024   DTaP/Tdap/Td (3 - Td or Tdap) 06/13/2031   Pneumonia Vaccine 61+ Years old  Completed   DEXA SCAN  Completed   Zoster Vaccines- Shingrix  Completed   HPV VACCINES  Aged Out    Health Maintenance  Health Maintenance Due  Topic Date Due   COVID-19 Vaccine (3 - Pfizer risk series) 11/08/2019   INFLUENZA VACCINE  04/10/2023    Colorectal cancer screening: No longer required.   Mammogram status: No longer required due to age.  Bone density - Declined  Lung Cancer Screening: (Low Dose CT Chest recommended if Age 59-80 years, 20 pack-year  currently smoking OR have quit w/in 15years.) does not qualify.   Lung Cancer Screening Referral:    Additional Screening:  Hepatitis C Screening: does not qualify; Completed    Vision Screening: Recommended annual ophthalmology exams for early detection of glaucoma and other disorders of the eye. Is the patient up to date with their annual eye exam?  Yes  Who is the provider or what is the name of the office in which the patient attends annual eye exams? [facility] If pt is not established with a provider, would they like to be referred to a provider to establish care? Yes .   Dental Screening: Recommended annual dental exams for proper oral hygiene  Community Resource Referral / Chronic Care Management: CRR required this visit?  No   CCM required this visit?  No     Plan:     I have personally reviewed and noted the following in the patient's chart:   Medical and social history Use of alcohol, tobacco or illicit drugs  Current medications and supplements including opioid prescriptions. Patient is not currently taking opioid prescriptions. Functional ability and status Nutritional  status Physical activity Advanced directives List of other physicians Hospitalizations, surgeries, and ER visits in previous 12 months Vitals Screenings to include cognitive, depression, and falls Referrals and appointments  In addition, I have reviewed and discussed with patient certain preventive protocols, quality metrics, and best practice recommendations. A written personalized care plan for preventive services as well as general preventive health recommendations were provided to patient.     Maryan Puls, LPN   [id number]   After Visit Summary: (MyChart) Due to this being a telephonic visit, the after visit summary with patients personalized plan was offered to patient via MyChart   Nurse Notes: none

## 2023-04-23 NOTE — Patient Instructions (Signed)
Katherine Brooks , Thank you for taking time to come for your Medicare Wellness Visit. I appreciate your ongoing commitment to your health goals. Please review the following plan we discussed and let me know if I can assist you in the future.   Referrals/Orders/Follow-Ups/Clinician Recommendations: Aim for 30 minutes of exercise or brisk walking, 6-8 glasses of water, and 5 servings of fruits and vegetables each day.   This is a list of the screening recommended for you and due dates:  Health Maintenance  Topic Date Due   COVID-19 Vaccine (3 - Pfizer risk series) 11/08/2019   Medicare Annual Wellness Visit  02/16/2023   Flu Shot  04/10/2023   DTaP/Tdap/Td vaccine (3 - Td or Tdap) 06/13/2031   Pneumonia Vaccine  Completed   DEXA scan (bone density measurement)  Completed   Zoster (Shingles) Vaccine  Completed   HPV Vaccine  Aged Out    Advanced directives: (Copy Requested) Please bring a copy of your health care power of attorney and living will to the office to be added to your chart at your convenience.  Next Medicare Annual Wellness Visit scheduled for next year: Yes  Preventive Care 23 Years and Older, Female Preventive care refers to lifestyle choices and visits with your health care provider that can promote health and wellness. What does preventive care include? A yearly physical exam. This is also called an annual well check. Dental exams once or twice a year. Routine eye exams. Ask your health care provider how often you should have your eyes checked. Personal lifestyle choices, including: Daily care of your teeth and gums. Regular physical activity. Eating a healthy diet. Avoiding tobacco and drug use. Limiting alcohol use. Practicing safe sex. Taking low-dose aspirin every day. Taking vitamin and mineral supplements as recommended by your health care provider. What happens during an annual well check? The services and screenings done by your health care provider during your  annual well check will depend on your age, overall health, lifestyle risk factors, and family history of disease. Counseling  Your health care provider may ask you questions about your: Alcohol use. Tobacco use. Drug use. Emotional well-being. Home and relationship well-being. Sexual activity. Eating habits. History of falls. Memory and ability to understand (cognition). Work and work Astronomer. Reproductive health. Screening  You may have the following tests or measurements: Height, weight, and BMI. Blood pressure. Lipid and cholesterol levels. These may be checked every 5 years, or more frequently if you are over 73 years old. Skin check. Lung cancer screening. You may have this screening every year starting at age 73 if you have a 30-pack-year history of smoking and currently smoke or have quit within the past 15 years. Fecal occult blood test (FOBT) of the stool. You may have this test every year starting at age 16. Flexible sigmoidoscopy or colonoscopy. You may have a sigmoidoscopy every 5 years or a colonoscopy every 10 years starting at age 25. Hepatitis C blood test. Hepatitis B blood test. Sexually transmitted disease (STD) testing. Diabetes screening. This is done by checking your blood sugar (glucose) after you have not eaten for a while (fasting). You may have this done every 1-3 years. Bone density scan. This is done to screen for osteoporosis. You may have this done starting at age 101. Mammogram. This may be done every 1-2 years. Talk to your health care provider about how often you should have regular mammograms. Talk with your health care provider about your test results, treatment options, and  if necessary, the need for more tests. Vaccines  Your health care provider may recommend certain vaccines, such as: Influenza vaccine. This is recommended every year. Tetanus, diphtheria, and acellular pertussis (Tdap, Td) vaccine. You may need a Td booster every 10  years. Zoster vaccine. You may need this after age 68. Pneumococcal 13-valent conjugate (PCV13) vaccine. One dose is recommended after age 66. Pneumococcal polysaccharide (PPSV23) vaccine. One dose is recommended after age 1. Talk to your health care provider about which screenings and vaccines you need and how often you need them. This information is not intended to replace advice given to you by your health care provider. Make sure you discuss any questions you have with your health care provider. Document Released: 09/22/2015 Document Revised: 05/15/2016 Document Reviewed: 06/27/2015 Elsevier Interactive Patient Education  2017 [employer].  Fall Prevention in the Home Falls can cause injuries. They can happen to people of all ages. There are many things you can do to make your home safe and to help prevent falls. What can I do on the outside of my home? Regularly fix the edges of walkways and driveways and fix any cracks. Remove anything that might make you trip as you walk through a door, such as a raised step or threshold. Trim any bushes or trees on the path to your home. Use bright outdoor lighting. Clear any walking paths of anything that might make someone trip, such as rocks or tools. Regularly check to see if handrails are loose or broken. Make sure that both sides of any steps have handrails. Any raised decks and porches should have guardrails on the edges. Have any leaves, snow, or ice cleared regularly. Use sand or salt on walking paths during winter. Clean up any spills in your garage right away. This includes oil or grease spills. What can I do in the bathroom? Use night lights. Install grab bars by the toilet and in the tub and shower. Do not use towel bars as grab bars. Use non-skid mats or decals in the tub or shower. If you need to sit down in the shower, use a plastic, non-slip stool. Keep the floor dry. Clean up any water that spills on the floor as soon as it  happens. Remove soap buildup in the tub or shower regularly. Attach bath mats securely with double-sided non-slip rug tape. Do not have throw rugs and other things on the floor that can make you trip. What can I do in the bedroom? Use night lights. Make sure that you have a light by your bed that is easy to reach. Do not use any sheets or blankets that are too big for your bed. They should not hang down onto the floor. Have a firm chair that has side arms. You can use this for support while you get dressed. Do not have throw rugs and other things on the floor that can make you trip. What can I do in the kitchen? Clean up any spills right away. Avoid walking on wet floors. Keep items that you use a lot in easy-to-reach places. If you need to reach something above you, use a strong step stool that has a grab bar. Keep electrical cords out of the way. Do not use floor polish or wax that makes floors slippery. If you must use wax, use non-skid floor wax. Do not have throw rugs and other things on the floor that can make you trip. What can I do with my stairs? Do not leave any  items on the stairs. Make sure that there are handrails on both sides of the stairs and use them. Fix handrails that are broken or loose. Make sure that handrails are as long as the stairways. Check any carpeting to make sure that it is firmly attached to the stairs. Fix any carpet that is loose or worn. Avoid having throw rugs at the top or bottom of the stairs. If you do have throw rugs, attach them to the floor with carpet tape. Make sure that you have a light switch at the top of the stairs and the bottom of the stairs. If you do not have them, ask someone to add them for you. What else can I do to help prevent falls? Wear shoes that: Do not have high heels. Have rubber bottoms. Are comfortable and fit you well. Are closed at the toe. Do not wear sandals. If you use a stepladder: Make sure that it is fully opened.  Do not climb a closed stepladder. Make sure that both sides of the stepladder are locked into place. Ask someone to hold it for you, if possible. Clearly mark and make sure that you can see: Any grab bars or handrails. First and last steps. Where the edge of each step is. Use tools that help you move around (mobility aids) if they are needed. These include: Canes. Walkers. Scooters. Crutches. Turn on the lights when you go into a dark area. Replace any light bulbs as soon as they burn out. Set up your furniture so you have a clear path. Avoid moving your furniture around. If any of your floors are uneven, fix them. If there are any pets around you, be aware of where they are. Review your medicines with your doctor. Some medicines can make you feel dizzy. This can increase your chance of falling. Ask your doctor what other things that you can do to help prevent falls. This information is not intended to replace advice given to you by your health care provider. Make sure you discuss any questions you have with your health care provider. Document Released: 06/22/2009 Document Revised: 02/01/2016 Document Reviewed: 09/30/2014 Elsevier Interactive Patient Education  2017 [employer].

## 2023-04-24 ENCOUNTER — Ambulatory Visit: Payer: Medicare PPO | Admitting: Physician Assistant

## 2023-04-28 ENCOUNTER — Other Ambulatory Visit: Payer: Self-pay | Admitting: Physician Assistant

## 2023-04-28 ENCOUNTER — Ambulatory Visit
Admission: RE | Admit: 2023-04-28 | Discharge: 2023-04-28 | Disposition: A | Discharge status: Home / Self Care | Payer: Medicare PPO | Source: Ambulatory Visit | Attending: Physician Assistant | Admitting: Physician Assistant

## 2023-04-28 ENCOUNTER — Telehealth: Payer: Self-pay | Admitting: Family

## 2023-04-28 DIAGNOSIS — K859 Acute pancreatitis without necrosis or infection, unspecified: Secondary | ICD-10-CM | POA: Insufficient documentation

## 2023-04-28 DIAGNOSIS — K449 Diaphragmatic hernia without obstruction or gangrene: Secondary | ICD-10-CM | POA: Diagnosis not present

## 2023-04-28 DIAGNOSIS — N281 Cyst of kidney, acquired: Secondary | ICD-10-CM | POA: Diagnosis not present

## 2023-04-28 DIAGNOSIS — K8020 Calculus of gallbladder without cholecystitis without obstruction: Secondary | ICD-10-CM | POA: Diagnosis not present

## 2023-04-28 LAB — POCT I-STAT CREATININE: Creatinine, Ser: 1.9 mg/dL — ABNORMAL HIGH (ref 0.44–1.00)

## 2023-04-28 NOTE — Telephone Encounter (Signed)
Per Katherine Brooks lab appt was cancelled. Katherine Brooks request seeing the patient for her cpx first.LVM for the patient

## 2023-04-29 ENCOUNTER — Other Ambulatory Visit: Payer: Medicare PPO

## 2023-04-29 NOTE — Progress Notes (Cosign Needed Addendum)
Celso Amy, PA-C [address]  [address]  [place], [place] [address]  Main: [phone]  Fax: [phone]   Primary Care Physician: Mort Sawyers, FNP  Primary Gastroenterologist:  Celso Amy, PA-C / Dr. Wyline Mood    CC:  F/U Pancreatitis  HPI: Katherine Brooks is a 87 y.o. female returns for 1 month follow-up of acute pancreatitis which started 03/25/2023 (first episode ever).  Initially had acute epigastric pain and elevated lipase 1,012.  Repeat labs 03/27/2023 showed greatly improved lipase of 80.  Normal liver test, electrolytes, triglycerides, and cholesterol.  She does not drink alcohol.  Currently she has no GI symptoms.  She is not having any abdominal pain.  She feels great today.  Abdominal pelvic CT 03/2023 showed mild pancreas inflammation and acute pancreatitis.  No abscess.  Medium hiatal hernia.  1 small 3 mm gallstones.  No evidence of abscess.  Normal liver.  Abdominal ultrasound 03/2023 showed 1 small 4 mm gallstone.  No evidence of cholecystitis.  Prominent common bile duct 7 mm.  Normal liver.  Abdominal MRI/MRCP 04/01/2023 showed moderate.  Pancreatic edema surrounding the body and tail of the pancreas similar to previous CT.  No evidence of pancreas necrosis or abscess.  No gallstone was seen.  Normal common bile duct 3 mm with no bile duct stone.  There was pancreas disease him and concern for possible early developing pseudocyst.  She had follow-up repeat abdominal CT yesterday 04/28/2023 and results are pending.   Current Outpatient Medications  Medication Sig Dispense Refill   atorvastatin (LIPITOR) 80 MG tablet Take 1 tablet (80 mg total) by mouth daily. 90 tablet 1   carvedilol (COREG) 3.125 MG tablet Take 1 tablet (3.125 mg total) by mouth 2 (two) times daily. 180 tablet 3   Cholecalciferol (VITAMIN D3) 125 MCG (5000 UT) TABS Take 5,000 Units by mouth daily.     ezetimibe (ZETIA) 10 MG tablet Take 1 tablet (10 mg total) by mouth daily. 90 tablet 3    ferrous sulfate 324 MG TBEC Take 324 mg by mouth daily with breakfast.     furosemide (LASIX) 20 MG tablet Take 1 tablet (20 mg total) by mouth daily as needed for edema. 90 tablet 0   losartan (COZAAR) 25 MG tablet Take 1 tablet (25 mg total) by mouth daily. 90 tablet 3   MAGNESIUM PO Take 400 mg by mouth daily.     No current facility-administered medications for this visit.    Allergies as of 04/30/2023 - Review Complete 04/30/2023  Allergen Reaction Noted   Lisinopril Other (See Comments) 10/01/2012   Carbamazepine Rash 07/16/2020    Past Medical History:  Diagnosis Date   Allergy    CAD (coronary artery disease)    a. 12/2021 MV: mid-dist ant, antsept ischemia; b. 12/2021 Cath: LM 60ost, 90d, LAD 90ost, 95p, 87m, D2 30, LCX 90p, RCA 90p; c. 12/2021 CABG x 4: LIMA->LAD, VG->OM, VG->Diag, VG->RPDA.   Cancer (HCC)    skin   Chronic HFrEF (heart failure with reduced ejection fraction) (HCC)    CKD (chronic kidney disease), stage III (HCC)    COVID-19 virus infection 09/2021   Facial pain    Hyperlipidemia LDL goal <70    Hypertension    Ischemic cardiomyopathy    a. 11/2021 Echo: EF 30-35%, glob HK, GrI DD, nl RV fxn, mildly dil LA, triv MR.   LBBB (left bundle branch block)     Past Surgical History:  Procedure Laterality Date  CORONARY ARTERY BYPASS GRAFT N/A 01/07/2022   Procedure: CORONARY ARTERY BYPASS GRAFTING (CABG) X FOUR BYPASSES USING OPEN LEFT INTERNAL MAMMARY ARTERY AND  ENDOSCOPIC RIGHT AND LEFT GREATER SAPHENOUS VEIN HARVEST.;  Surgeon: Alleen Borne, MD;  Location: [facility] OR;  Service: Open Heart Surgery;  Laterality: N/A;   LEFT HEART CATH AND CORONARY ANGIOGRAPHY Left 01/01/2022   Procedure: LEFT HEART CATH AND CORONARY ANGIOGRAPHY;  Surgeon: Yvonne Kendall, MD;  Location: [facility] INVASIVE CV LAB;  Service: Cardiovascular;  Laterality: Left;   salivary gland N/A 12/2012   TEE WITHOUT CARDIOVERSION N/A 01/07/2022   Procedure: TRANSESOPHAGEAL ECHOCARDIOGRAM (TEE);   Surgeon: Alleen Borne, MD;  Location: [facility] OR;  Service: Open Heart Surgery;  Laterality: N/A;    Review of Systems:    All systems reviewed and negative except where noted in HPI.   Physical Examination:   BP 115/70   Pulse 75   Temp 97.8 F (36.6 C) (Oral)   Ht 5' (1.524 m)   Wt 168 lb 8 oz (76.4 kg)   BMI 32.91 kg/m   General: Well-nourished, well-developed in no acute distress.  Neuro: Alert and oriented x 3.  Grossly intact.  Psych: Alert and cooperative, normal mood and affect.   Imaging Studies: MR 3D Recon At Scanner  Result Date: 04/10/2023 CLINICAL DATA:  Dilated bile duct on ultrasound. Cholelithiasis. Abdominal pain for 1 week. EXAM: MRI ABDOMEN WITHOUT AND WITH CONTRAST (INCLUDING MRCP) TECHNIQUE: Multiplanar multisequence MR imaging of the abdomen was performed both before and after the administration of intravenous contrast. Heavily T2-weighted images of the biliary and pancreatic ducts were obtained, and three-dimensional MRCP images were rendered by post processing. CONTRAST:  8mL GADAVIST GADOBUTROL 1 MMOL/ML IV SOLN COMPARISON:  Ultrasound 03/31/2023.  CT 03/26/2023 FINDINGS: Portions of exam are mildly motion degraded. Lower chest: Tiny bilateral pleural effusions. Mild cardiomegaly. Small hiatal hernia. Hepatobiliary: Normal liver. Gallstones described on ultrasound are not readily apparent. No acute cholecystitis. No intra or extrahepatic biliary duct dilatation. The common duct measures maximally 3 mm on 23/15. No choledocholithiasis. Pancreas: No pancreatic duct dilatation. Pancreas divisum, with a dorsal duct entering the duodenum on 21/5. Moderate peripancreatic edema surrounding the body and tail is relatively similar to the CT of 03/26/2023. No evidence of pancreatic necrosis. Off the cephalad portion of the pancreas, most apparent on post-contrast imaging, is ill-defined fluid with suggestion of surrounding enhancement. Example about the caudal aspect of the  gastric cardia at 7.5 x 3.2 cm on 32/24. Surrounding the hiatal hernia including posteriorly at 3.9 x 2.5 cm on 17/24. Spleen:  Normal in size, without focal abnormality. Adrenals/Urinary Tract: Normal adrenal glands. Normal right kidney. Lower pole left renal 1.2 cm cyst . In the absence of clinically indicated signs/symptoms require(s) no independent follow-up. No hydronephrosis. Stomach/Bowel: Otherwise normal distal stomach. Normal small bowel loops. Vascular/Lymphatic: Separate origins of the common hepatic and splenic arteries. Aortic atherosclerosis. Patent splenic and portal veins. No retroperitoneal or retrocrural adenopathy. Other:  No ascites. Musculoskeletal: No acute osseous abnormality. IMPRESSION: 1. Mildly motion degraded exam. 2. No choledocholithiasis or biliary duct dilatation. 3. Relatively similar moderate acute interstitial pancreatitis. 4. Areas of ill-defined fluid tracking cephalad the pancreas and through the esophageal hiatus are suspicious for early developing peripancreatic collections. Consider (ideally contrast enhanced) CT follow-up at 7-14 days to exclude developing pseudocyst. 5. Pancreas divisum 6.  Aortic Atherosclerosis (ICD10-I70.0). These results will be called to the ordering clinician or representative by the Radiologist Assistant, and communication documented in the  PACS or [employer]. Electronically Signed   By: Jeronimo Greaves M.D.   On: 04/10/2023 08:32   MR ABDOMEN MRCP W WO CONTAST  Result Date: 04/09/2023 CLINICAL DATA:  Dilated bile duct on ultrasound. Cholelithiasis. Abdominal pain for 1 week. EXAM: MRI ABDOMEN WITHOUT AND WITH CONTRAST (INCLUDING MRCP) TECHNIQUE: Multiplanar multisequence MR imaging of the abdomen was performed both before and after the administration of intravenous contrast. Heavily T2-weighted images of the biliary and pancreatic ducts were obtained, and three-dimensional MRCP images were rendered by post processing. CONTRAST:  8mL GADAVIST  GADOBUTROL 1 MMOL/ML IV SOLN COMPARISON:  Ultrasound 03/31/2023.  CT 03/26/2023 FINDINGS: Portions of exam are mildly motion degraded. Lower chest: Tiny bilateral pleural effusions. Mild cardiomegaly. Small hiatal hernia. Hepatobiliary: Normal liver. Gallstones described on ultrasound are not readily apparent. No acute cholecystitis. No intra or extrahepatic biliary duct dilatation. The common duct measures maximally 3 mm on 23/15. No choledocholithiasis. Pancreas: No pancreatic duct dilatation. Pancreas divisum, with a dorsal duct entering the duodenum on 21/5. Moderate peripancreatic edema surrounding the body and tail is relatively similar to the CT of 03/26/2023. No evidence of pancreatic necrosis. Off the cephalad portion of the pancreas, most apparent on post-contrast imaging, is ill-defined fluid with suggestion of surrounding enhancement. Example about the caudal aspect of the gastric cardia at 7.5 x 3.2 cm on 32/24. Surrounding the hiatal hernia including posteriorly at 3.9 x 2.5 cm on 17/24. Spleen:  Normal in size, without focal abnormality. Adrenals/Urinary Tract: Normal adrenal glands. Normal right kidney. Lower pole left renal 1.2 cm cyst . In the absence of clinically indicated signs/symptoms require(s) no independent follow-up. No hydronephrosis. Stomach/Bowel: Otherwise normal distal stomach. Normal small bowel loops. Vascular/Lymphatic: Separate origins of the common hepatic and splenic arteries. Aortic atherosclerosis. Patent splenic and portal veins. No retroperitoneal or retrocrural adenopathy. Other:  No ascites. Musculoskeletal: No acute osseous abnormality. IMPRESSION: 1. Mildly motion degraded exam. 2. No choledocholithiasis or biliary duct dilatation. 3. Relatively similar moderate acute interstitial pancreatitis. 4. Areas of ill-defined fluid tracking cephalad the pancreas and through the esophageal hiatus are suspicious for early developing peripancreatic collections. Consider (ideally  contrast enhanced) CT follow-up at 7-14 days to exclude developing pseudocyst. 5. Pancreas divisum 6.  Aortic Atherosclerosis (ICD10-I70.0). These results will be called to the ordering clinician or representative by the Radiologist Assistant, and communication documented in the PACS or [employer]. Electronically Signed   By: Jeronimo Greaves M.D.   On: 04/09/2023 13:50    Assessment and Plan:   IVANA YUSUPOV is a 87 y.o. y/o female returns for 1 month follow-up of acute pancreatitis of uncertain etiology.  Suspect she may have previously passed a gallstone.  She denies alcohol use.  MRI/MRCP showed no bile duct stone.  There was 1 small gallstone on ultrasound but not seen on MRI.  Triglycerides normal.  No new medications.  Upper abdominal pain has currently resolved.  1.  Acute idiopathic pancreatitis -currently resolved and asymptomatic  Recommend low-fat diet  Patient education given  We will follow-up with abdominal CT done yesterday (results pending).  2.  Cholelithiasis -3 mm small gallstone, currently asymptomatic  Recommend low-fat diet  Follow-up if she has any recurrent upper abdominal pain  Celso Amy, PA-C  Follow up if she has any recurrent upper abdominal pain.  Also follow-up based on CT results.  Addendum 05/05/2023: I discussed patient's case with Dr. Tobi Bastos.  He recommends we refer to general surgeon to discuss cholecystectomy to prevent recurrent pancreatitis  from gallstone.  I will call and discuss referral and recommendation with patient. Celso Amy, PA-C

## 2023-04-30 ENCOUNTER — Encounter: Payer: Self-pay | Admitting: Physician Assistant

## 2023-04-30 ENCOUNTER — Ambulatory Visit: Payer: Medicare PPO | Admitting: Physician Assistant

## 2023-04-30 VITALS — BP 115/70 | HR 75 | Temp 97.8°F | Ht 60.0 in | Wt 168.5 lb

## 2023-04-30 DIAGNOSIS — K8020 Calculus of gallbladder without cholecystitis without obstruction: Secondary | ICD-10-CM

## 2023-04-30 DIAGNOSIS — Z8719 Personal history of other diseases of the digestive system: Secondary | ICD-10-CM | POA: Diagnosis not present

## 2023-04-30 DIAGNOSIS — K8500 Idiopathic acute pancreatitis without necrosis or infection: Secondary | ICD-10-CM

## 2023-05-01 ENCOUNTER — Encounter: Payer: Medicare PPO | Admitting: Family

## 2023-05-05 ENCOUNTER — Telehealth: Payer: Self-pay | Admitting: Physician Assistant

## 2023-05-05 NOTE — Progress Notes (Signed)
I called and notified patient about her results.  It can take several months for pancreatitis to resolve.  No cyst or masses.  We will plan for 14-month follow-up office visit and plan to repeat CT in 3 months to ensure resolution.  If she has more episodes of upper abdominal pain, then we can refer to discuss cholecystectomy.  Patient declined surgical referral at this time.

## 2023-05-05 NOTE — Telephone Encounter (Signed)
Abdominal CT results.  Pancreatitis persistent.

## 2023-05-05 NOTE — Progress Notes (Signed)
Abdominal pelvic CT shows persistent pancreatitis, pancreas inflammation.  No evidence of cyst or abscess in the pancreas.  There is again evidence of small gallstone.  We need to discuss referral to general surgeon to discuss cholecystectomy.  Also need to order lab repeat lipase.  I called and left patient a voicemail message to return my call for results.

## 2023-05-05 NOTE — Telephone Encounter (Signed)
Celso Amy, PA-C  You41 minutes ago (4:35 PM)    I called and discussed patient's CT results with patient.  She declined surgical referral at this time.  She wants to watch and wait.  Currently she has no abdominal pain at all.  **Please schedule patient 109-month follow-up office visit with me to follow-up pancreatitis.  We will plan to repeat CT in 3 months to ensure resolution. Celso Amy, PA-C

## 2023-05-08 ENCOUNTER — Encounter: Payer: Self-pay | Admitting: Family

## 2023-05-08 ENCOUNTER — Ambulatory Visit: Payer: Medicare PPO | Admitting: Family

## 2023-05-08 VITALS — BP 114/62 | HR 65 | Temp 98.1°F | Ht 60.5 in | Wt 171.0 lb

## 2023-05-08 DIAGNOSIS — R748 Abnormal levels of other serum enzymes: Secondary | ICD-10-CM | POA: Diagnosis not present

## 2023-05-08 DIAGNOSIS — R600 Localized edema: Secondary | ICD-10-CM

## 2023-05-08 DIAGNOSIS — Z0000 Encounter for general adult medical examination without abnormal findings: Secondary | ICD-10-CM | POA: Diagnosis not present

## 2023-05-08 DIAGNOSIS — Z8679 Personal history of other diseases of the circulatory system: Secondary | ICD-10-CM

## 2023-05-08 DIAGNOSIS — I5020 Unspecified systolic (congestive) heart failure: Secondary | ICD-10-CM | POA: Diagnosis not present

## 2023-05-08 DIAGNOSIS — E669 Obesity, unspecified: Secondary | ICD-10-CM

## 2023-05-08 DIAGNOSIS — E7800 Pure hypercholesterolemia, unspecified: Secondary | ICD-10-CM

## 2023-05-08 DIAGNOSIS — N184 Chronic kidney disease, stage 4 (severe): Secondary | ICD-10-CM | POA: Diagnosis not present

## 2023-05-08 DIAGNOSIS — E559 Vitamin D deficiency, unspecified: Secondary | ICD-10-CM | POA: Insufficient documentation

## 2023-05-08 DIAGNOSIS — D631 Anemia in chronic kidney disease: Secondary | ICD-10-CM

## 2023-05-08 DIAGNOSIS — N289 Disorder of kidney and ureter, unspecified: Secondary | ICD-10-CM

## 2023-05-08 DIAGNOSIS — Z0001 Encounter for general adult medical examination with abnormal findings: Secondary | ICD-10-CM | POA: Diagnosis not present

## 2023-05-08 DIAGNOSIS — M810 Age-related osteoporosis without current pathological fracture: Secondary | ICD-10-CM

## 2023-05-08 DIAGNOSIS — K861 Other chronic pancreatitis: Secondary | ICD-10-CM

## 2023-05-08 LAB — MICROALBUMIN / CREATININE URINE RATIO
Creatinine,U: 26.1 mg/dL
Microalb Creat Ratio: 2.7 mg/g (ref 0.0–30.0)
Microalb, Ur: <0.7 mg/dL (ref 0.0–1.9)

## 2023-05-08 LAB — VITAMIN B12: Vitamin B-12: 446 pg/mL (ref 211–911)

## 2023-05-08 LAB — LIPASE: Lipase: 24 U/L (ref 11.0–59.0)

## 2023-05-08 LAB — VITAMIN D 25 HYDROXY (VIT D DEFICIENCY, FRACTURES): VITD: 77.7 ng/mL (ref 30.00–100.00)

## 2023-05-08 MED ORDER — CARVEDILOL 3.125 MG PO TABS
ORAL_TABLET | ORAL | Status: DC
Start: 2023-05-08 — End: 2023-12-15

## 2023-05-08 MED ORDER — LOSARTAN POTASSIUM 25 MG PO TABS
ORAL_TABLET | ORAL | Status: DC
Start: 2023-05-08 — End: 2023-12-04

## 2023-05-08 NOTE — Assessment & Plan Note (Signed)
Stable on exam today.

## 2023-05-08 NOTE — Assessment & Plan Note (Signed)

## 2023-05-08 NOTE — Assessment & Plan Note (Signed)
Urine microalbumin today ordered pending results.  Managed with cardiology, doing well with losartan 1/2 tablet once daily and carvedilol.

## 2023-05-08 NOTE — Progress Notes (Signed)
Subjective:  Patient ID: Katherine Brooks, female    DOB: July 19, 1936  Age: 87 y.o. MRN: [id number]  Patient Care Team: Mort Sawyers, FNP as PCP - General (Family Medicine) Antonieta Iba, MD as PCP - Cardiology (Cardiology)   CC:  Chief Complaint  Patient presents with   Annual Exam    HPI Katherine Brooks is a 87 y.o. female who presents today for an annual physical exam. She reports consuming a general diet.  Walks and gardening  She generally feels well. She reports sleeping well. She does not have additional problems to discuss today.   Vision:Within last year Dental:Receives regular dental care   Mammogram: > 58 y/o  Bone density scan: 07/11/2021, ordered    Pt is without acute concerns.   Advanced Directives Patient does have advanced directives    DEPRESSION SCREENING    05/08/2023   10:51 AM 04/23/2023   10:22 AM 02/15/2022    9:30 AM 11/08/2020    1:12 PM 07/12/2020    3:55 PM 07/07/2019   10:31 AM 07/05/2019   11:18 AM  PHQ 2/9 Scores  PHQ - 2 Score 0 0 0 0 0 0 0  PHQ- 9 Score 0   0 3 0 0     ROS: Negative unless specifically indicated above in HPI.    Current Outpatient Medications:    Ascorbic Acid (VITAMIN C) 1000 MG tablet, Take 1,000 mg by mouth daily., Disp: , Rfl:    aspirin EC 81 MG tablet, Take 81 mg by mouth daily. Swallow whole., Disp: , Rfl:    Cholecalciferol (VITAMIN D3) 50 MCG (2000 UT) TABS, Take 1 each by mouth daily., Disp: , Rfl:    Cyanocobalamin (B-12) 5000 MCG CAPS, Take 1 Dose by mouth daily., Disp: , Rfl:    atorvastatin (LIPITOR) 80 MG tablet, Take 1 tablet (80 mg total) by mouth daily., Disp: 90 tablet, Rfl: 1   carvedilol (COREG) 3.125 MG tablet, Take once daily, Disp: , Rfl:    ezetimibe (ZETIA) 10 MG tablet, Take 1 tablet (10 mg total) by mouth daily., Disp: 90 tablet, Rfl: 3   furosemide (LASIX) 20 MG tablet, Take 1 tablet (20 mg total) by mouth daily as needed for edema., Disp: 90 tablet, Rfl: 0   losartan (COZAAR) 25 MG  tablet, Take 1/2 tablet once daily, Disp: , Rfl:    MAGNESIUM PO, Take 400 mg by mouth daily., Disp: , Rfl:     Objective:    BP 114/62 (BP Location: Left Arm, Patient Position: Sitting, Cuff Size: Normal)   Pulse 65   Temp 98.1 F (36.7 C) (Oral)   Ht 5' 0.5" (1.537 m)   Wt 171 lb (77.6 kg)   SpO2 97%   BMI 32.85 kg/m   BP Readings from Last 3 Encounters:  05/08/23 114/62  04/30/23 115/70  04/23/23 113/71      Physical Exam Constitutional:      General: She is not in acute distress.    Appearance: Normal appearance. She is normal weight. She is not ill-appearing.  HENT:     Head: Normocephalic.     Right Ear: Tympanic membrane normal.     Left Ear: Tympanic membrane normal.     Nose: Nose normal.     Mouth/Throat:     Mouth: Mucous membranes are moist.  Eyes:     Extraocular Movements: Extraocular movements intact.     Pupils: Pupils are equal, round, and reactive to light.  Cardiovascular:  Rate and Rhythm: Normal rate and regular rhythm.  Pulmonary:     Effort: Pulmonary effort is normal.     Breath sounds: Normal breath sounds.  Abdominal:     General: Abdomen is flat. Bowel sounds are normal.     Palpations: Abdomen is soft.     Tenderness: There is no guarding or rebound.  Musculoskeletal:        General: Normal range of motion.     Cervical back: Normal range of motion.  Skin:    General: Skin is warm.     Capillary Refill: Capillary refill takes less than 2 seconds.  Neurological:     General: No focal deficit present.     Mental Status: She is alert.  Psychiatric:        Mood and Affect: Mood normal.        Behavior: Behavior normal.        Thought Content: Thought content normal.        Judgment: Judgment normal.          Assessment & Plan:  Pure hypercholesterolemia Assessment & Plan: Continue atorvastatin 80 mg and zetia 10 mg once daily.     Anemia in stage 4 chronic kidney disease (HCC)  Encounter for general adult medical  examination with abnormal findings Assessment & Plan: Patient Counseling(The following topics were reviewed):  Preventative care handout given to pt  Health maintenance and immunizations reviewed. Please refer to Health maintenance section. Pt advised on safe sex, wearing seatbelts in car, and proper nutrition labwork ordered today for annual Dental health: Discussed importance of regular tooth brushing, flossing, and dental visits.   Orders: -     Hepatitis C antibody  HFrEF (heart failure with reduced ejection fraction) (HCC) -     Carvedilol; Take once daily  Age-related osteoporosis without current pathological fracture -     DG Bone Density; Future  Obesity (BMI 35.0-39.9 without comorbidity)  Pedal edema Assessment & Plan: Stable on exam today.    History of hypertension Assessment & Plan: Urine microalbumin today ordered pending results.  Managed with cardiology, doing well with losartan 1/2 tablet once daily and carvedilol.     Orders: -     Losartan Potassium; Take 1/2 tablet once daily -     Carvedilol; Take once daily -     Microalbumin / creatinine urine ratio  Chronic pancreatitis, unspecified pancreatitis type [facility]) Assessment & Plan: Ordering lipase for GI, will send once received.    Orders: -     Lipase  Vitamin D deficiency -     VITAMIN D 25 Hydroxy (Vit-D Deficiency, Fractures)  Elevated vitamin B12 level -     Vitamin B12  Renal insufficiency, mild -     Microalbumin / creatinine urine ratio  Chronic kidney disease (CKD) stage G4/A1, severely decreased glomerular filtration rate (GFR) between 15-29 mL/min/1.73 square meter and albuminuria creatinine ratio less than 30 mg/g (HCC) Assessment & Plan: Reviewed. Continue f/u with nephrology as scheduled.        Follow-up: Return in about 1 year (around 05/07/2024) for f/u CPE.   Mort Sawyers, FNP

## 2023-05-08 NOTE — Assessment & Plan Note (Signed)
Continue atorvastatin 80 mg and zetia 10 mg once daily.

## 2023-05-08 NOTE — Assessment & Plan Note (Signed)
Ordering lipase for GI, will send once received.

## 2023-05-08 NOTE — Assessment & Plan Note (Signed)
Reviewed. Continue f/u with nephrology as scheduled.

## 2023-05-09 LAB — HEPATITIS C ANTIBODY: Hepatitis C Ab: NONREACTIVE

## 2023-05-25 IMAGING — DX DG FINGER THUMB 2+V*L*
3 series · 3 of 3 positions shown · non-contrast
Comparison: None.

CLINICAL DATA: Cut tip of left thumb today with saw, rule out open
fracture

EXAM:
LEFT THUMB 2+V

[finger ap]
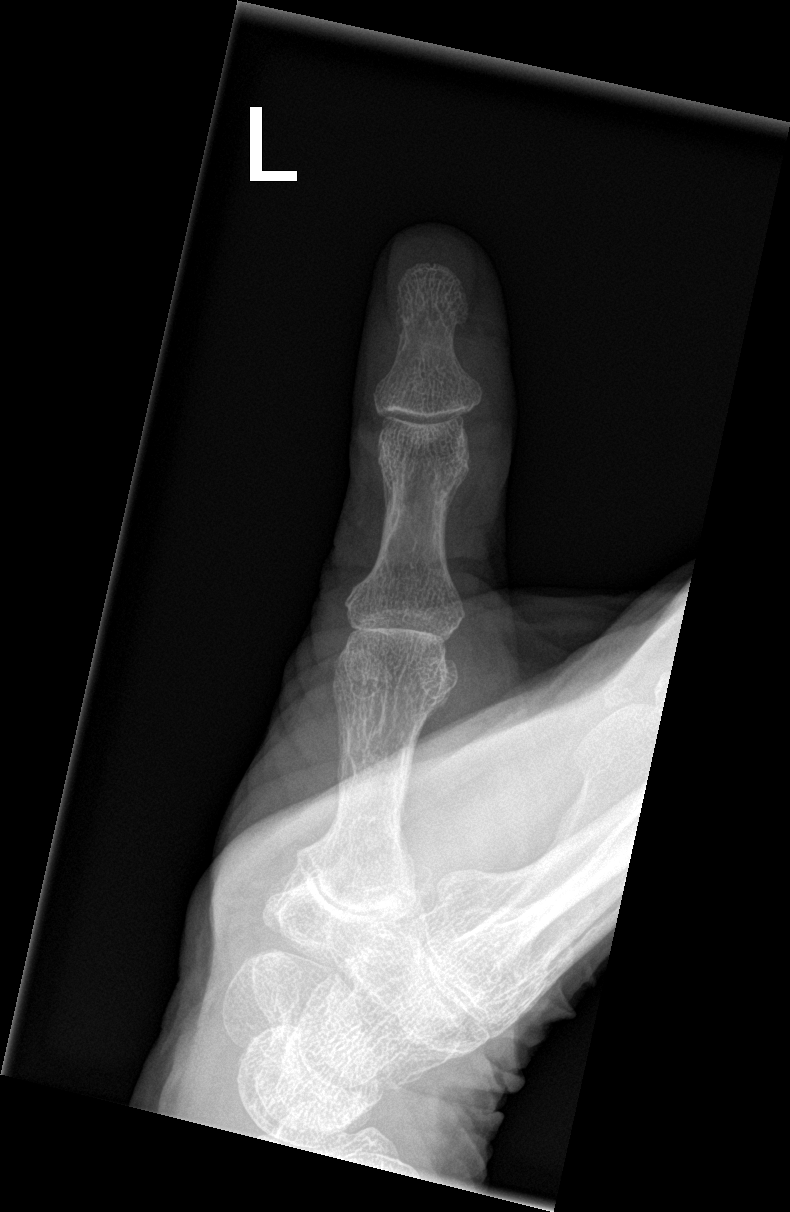

[finger obl]
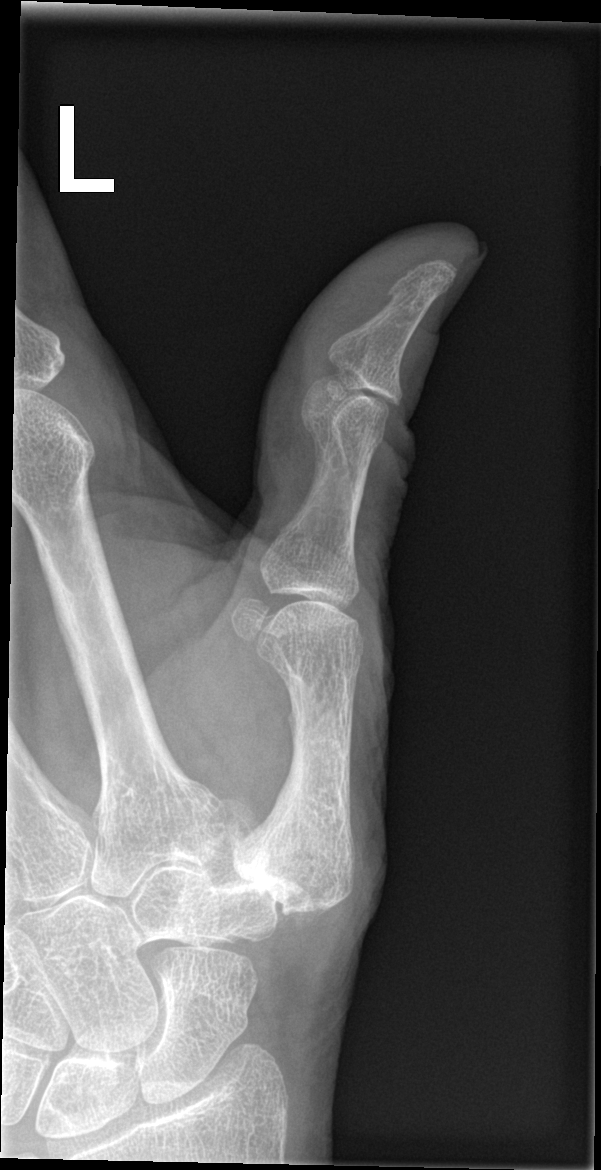

[finger lat]
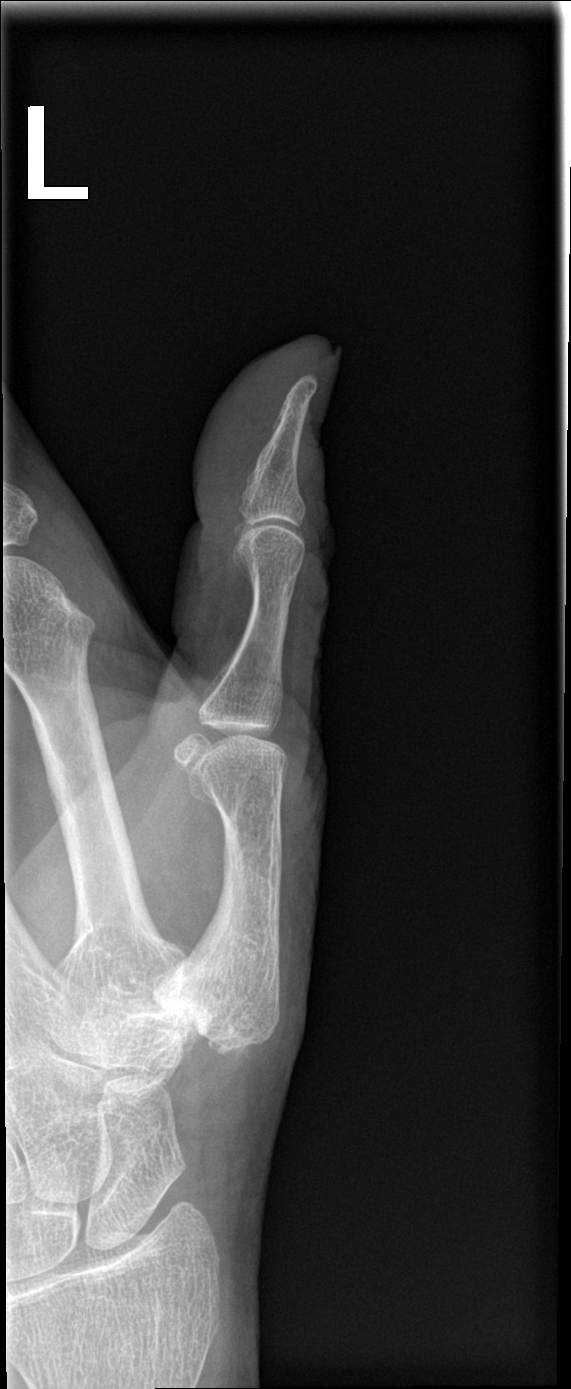

[3 of 3 positions shown; findings below may reference images not displayed]

FINDINGS: There is no evidence of fracture or dislocation. Degenerative
changes, most prominent at the first CMC joint. No foreign body.
IMPRESSION: Negative for fracture or foreign body.

## 2023-05-26 DIAGNOSIS — R600 Localized edema: Secondary | ICD-10-CM | POA: Diagnosis not present

## 2023-05-26 DIAGNOSIS — I10 Essential (primary) hypertension: Secondary | ICD-10-CM | POA: Diagnosis not present

## 2023-05-26 DIAGNOSIS — E875 Hyperkalemia: Secondary | ICD-10-CM | POA: Diagnosis not present

## 2023-05-26 DIAGNOSIS — N1832 Chronic kidney disease, stage 3b: Secondary | ICD-10-CM | POA: Diagnosis not present

## 2023-05-26 DIAGNOSIS — I129 Hypertensive chronic kidney disease with stage 1 through stage 4 chronic kidney disease, or unspecified chronic kidney disease: Secondary | ICD-10-CM | POA: Diagnosis not present

## 2023-05-26 DIAGNOSIS — D631 Anemia in chronic kidney disease: Secondary | ICD-10-CM | POA: Diagnosis not present

## 2023-07-29 DIAGNOSIS — H18831 Recurrent erosion of cornea, right eye: Secondary | ICD-10-CM | POA: Diagnosis not present

## 2023-07-29 DIAGNOSIS — H5711 Ocular pain, right eye: Secondary | ICD-10-CM | POA: Diagnosis not present

## 2023-07-30 ENCOUNTER — Other Ambulatory Visit: Payer: Self-pay

## 2023-07-30 DIAGNOSIS — H18831 Recurrent erosion of cornea, right eye: Secondary | ICD-10-CM | POA: Diagnosis not present

## 2023-07-30 DIAGNOSIS — H1820 Unspecified corneal edema: Secondary | ICD-10-CM | POA: Diagnosis not present

## 2023-07-30 MED ORDER — FLUAD 0.5 ML IM SUSY
0.5000 mL | PREFILLED_SYRINGE | Freq: Once | INTRAMUSCULAR | 0 refills | Status: AC
  Filled 2023-07-30 (×2): qty 0.5, 1d supply, fill #0

## 2023-08-04 ENCOUNTER — Other Ambulatory Visit: Payer: Self-pay

## 2023-08-04 DIAGNOSIS — H1820 Unspecified corneal edema: Secondary | ICD-10-CM | POA: Diagnosis not present

## 2023-08-04 DIAGNOSIS — H18831 Recurrent erosion of cornea, right eye: Secondary | ICD-10-CM | POA: Diagnosis not present

## 2023-08-04 MED ORDER — AREXVY 120 MCG/0.5ML IM SUSR
0.5000 mL | Freq: Once | INTRAMUSCULAR | 0 refills | Status: AC
  Filled 2023-08-04: qty 0.5, 1d supply, fill #0

## 2023-08-05 NOTE — Progress Notes (Signed)
Celso Amy, PA-C [address]  [address]  [place], [place] [address]  Main: [phone]  Fax: [phone]   Primary Care Physician: Mort Sawyers, FNP  Primary Gastroenterologist:  Celso Amy, PA-C / Dr. Wyline Mood    CC:  F/U Pancreatitis  HPI: Katherine Brooks is a 87 y.o. female  returns for 3 monthfollow-up of acute pancreatitis which started 03/25/2023 (first episode ever).  Initially had acute epigastric pain and elevated lipase 1,012.  Repeat labs 03/27/2023 showed greatly improved lipase of 80.  Normal liver test, electrolytes, triglycerides, and cholesterol.  She does not drink alcohol.     Abdominal pelvic CT 03/2023 showed mild pancreas inflammation and acute pancreatitis.  No abscess.  Medium hiatal hernia.  1 small 3 mm gallstones.  No evidence of abscess.  Normal liver.   Abdominal ultrasound 03/2023 showed 1 small 4 mm gallstone.  No evidence of cholecystitis.  Prominent common bile duct 7 mm.  Normal liver.   Abdominal MRI/MRCP 04/01/2023 showed moderate Pancreatic edema surrounding the body and tail of the pancreas similar to previous CT.  No evidence of pancreas necrosis or abscess.  No gallstone was seen.  Normal common bile duct 3 mm with no bile duct stone.  There was pancreas divisum and concern for possible early developing pseudocyst.   Abdominal CT 04/28/2023: A small Gallstone.   Persistent peripancreatic stranding in keeping with pancreatitis. Correlation with pancreatic enzymes recommended. No drainable fluid collection/abscess or pseudocyst.  Current symptoms: Patient has not had any more abdominal pain in the past 3 months.  She denies diarrhea, constipation, nausea, vomiting, abdominal pain, or any GI symptoms.  She is caring for her husband who has Parkinson's.  She is scheduled to have cataract surgery in the next few months.  She adamantly declines to do another CT or consider cholecystectomy surgery.  Current Outpatient Medications  Medication Sig  Dispense Refill   Ascorbic Acid (VITAMIN C) 1000 MG tablet Take 1,000 mg by mouth daily.     aspirin EC 81 MG tablet Take 81 mg by mouth daily. Swallow whole.     atorvastatin (LIPITOR) 80 MG tablet Take 1 tablet (80 mg total) by mouth daily. 90 tablet 1   carvedilol (COREG) 3.125 MG tablet Take once daily     Cholecalciferol (VITAMIN D3) 50 MCG (2000 UT) TABS Take 1 each by mouth daily.     Cyanocobalamin (B-12) 5000 MCG CAPS Take 1 Dose by mouth daily.     ezetimibe (ZETIA) 10 MG tablet Take 1 tablet (10 mg total) by mouth daily. 90 tablet 3   furosemide (LASIX) 20 MG tablet Take 1 tablet (20 mg total) by mouth daily as needed for edema. 90 tablet 0   losartan (COZAAR) 25 MG tablet Take 1/2 tablet once daily     MAGNESIUM PO Take 400 mg by mouth daily.     No current facility-administered medications for this visit.    Allergies as of 08/06/2023 - Review Complete 08/06/2023  Allergen Reaction Noted   Iron  05/08/2023   Lisinopril Other (See Comments) 10/01/2012   Carbamazepine Rash 07/16/2020    Past Medical History:  Diagnosis Date   Allergy    CAD (coronary artery disease)    a. 12/2021 MV: mid-dist ant, antsept ischemia; b. 12/2021 Cath: LM 60ost, 90d, LAD 90ost, 95p, 39m, D2 30, LCX 90p, RCA 90p; c. 12/2021 CABG x 4: LIMA->LAD, VG->OM, VG->Diag, VG->RPDA.   Cancer (HCC)    skin   Chronic  HFrEF (heart failure with reduced ejection fraction) (HCC)    CKD (chronic kidney disease), stage III (HCC)    COVID-19 virus infection 09/2021   Facial pain    Hyperlipidemia LDL goal <70    Hypertension    Ischemic cardiomyopathy    a. 11/2021 Echo: EF 30-35%, glob HK, GrI DD, nl RV fxn, mildly dil LA, triv MR.   LBBB (left bundle branch block)     Past Surgical History:  Procedure Laterality Date   CORONARY ARTERY BYPASS GRAFT N/A 01/07/2022   Procedure: CORONARY ARTERY BYPASS GRAFTING (CABG) X FOUR BYPASSES USING OPEN LEFT INTERNAL MAMMARY ARTERY AND  ENDOSCOPIC RIGHT AND LEFT GREATER  SAPHENOUS VEIN HARVEST.;  Surgeon: Alleen Borne, MD;  Location: [facility] OR;  Service: Open Heart Surgery;  Laterality: N/A;   LEFT HEART CATH AND CORONARY ANGIOGRAPHY Left 01/01/2022   Procedure: LEFT HEART CATH AND CORONARY ANGIOGRAPHY;  Surgeon: Yvonne Kendall, MD;  Location: [facility] INVASIVE CV LAB;  Service: Cardiovascular;  Laterality: Left;   salivary gland N/A 12/2012   TEE WITHOUT CARDIOVERSION N/A 01/07/2022   Procedure: TRANSESOPHAGEAL ECHOCARDIOGRAM (TEE);  Surgeon: Alleen Borne, MD;  Location: [facility] OR;  Service: Open Heart Surgery;  Laterality: N/A;    Review of Systems:    All systems reviewed and negative except where noted in HPI.   Physical Examination:   BP (!) 140/60   Pulse 76   Ht 5' (1.524 m)   Wt 181 lb (82.1 kg)   BMI 35.35 kg/m   General: Well-nourished, well-developed in no acute distress.  Lungs: Clear to auscultation bilaterally. Non-labored. Heart: Regular rate and rhythm, no murmurs rubs or gallops.  Abdomen: Bowel sounds are normal; Abdomen is Soft; No hepatosplenomegaly, masses or hernias;  No Abdominal Tenderness; No guarding or rebound tenderness. Neuro: Alert and oriented x 3.  Grossly intact.  Psych: Alert and cooperative, normal mood and affect.   Imaging Studies: No results found.  Assessment and Plan:   Katherine Brooks is a 87 y.o. y/o female returns for 3 month f/u Pancreatitis.  Patient has not had any more abdominal pain in the past 3 months.  She denies diarrhea, constipation, nausea, vomiting, abdominal pain, or any GI symptoms.  She is caring for her husband who has Parkinson's.  She is scheduled to have cataract surgery in the next few months.  She adamantly declines to do another CT or consider cholecystectomy surgery.  Patient prefers to monitor symptoms and she will let us know if she has any recurrent abdominal pain in the future.  Pancreatitis Labs: CBC, CMP, Lipase  I recommended Repeat abdominal CT, pancreas protocol, however patient  adamantly declined. ` Recommend low-fat diet.  Return if she has any abdominal pain.  Cholelithiasis  I recommended Referral to surgeon to discuss cholecystectomy, however, patient adamantly declined.  Pancreas Divisum  4.  Stage III chronic kidney disease followed by nephrologist Dr. Thedore Mins  Need to avoid CT contrast  Celso Amy, PA-C  Follow up As Needed if she has any recurrent GI symptoms.

## 2023-08-06 ENCOUNTER — Ambulatory Visit: Payer: Medicare PPO | Admitting: Physician Assistant

## 2023-08-06 ENCOUNTER — Encounter: Payer: Self-pay | Admitting: Physician Assistant

## 2023-08-06 VITALS — BP 140/60 | HR 76 | Ht 60.0 in | Wt 181.0 lb

## 2023-08-06 DIAGNOSIS — Q453 Other congenital malformations of pancreas and pancreatic duct: Secondary | ICD-10-CM | POA: Diagnosis not present

## 2023-08-06 DIAGNOSIS — K8500 Idiopathic acute pancreatitis without necrosis or infection: Secondary | ICD-10-CM

## 2023-08-06 DIAGNOSIS — K8020 Calculus of gallbladder without cholecystitis without obstruction: Secondary | ICD-10-CM

## 2023-08-06 DIAGNOSIS — N183 Chronic kidney disease, stage 3 unspecified: Secondary | ICD-10-CM | POA: Diagnosis not present

## 2023-08-06 DIAGNOSIS — K859 Acute pancreatitis without necrosis or infection, unspecified: Secondary | ICD-10-CM | POA: Diagnosis not present

## 2023-08-07 LAB — COMPREHENSIVE METABOLIC PANEL
ALT: 12 [IU]/L (ref 0–32)
AST: 21 [IU]/L (ref 0–40)
Albumin: 4.1 g/dL (ref 3.7–4.7)
Alkaline Phosphatase: 120 [IU]/L (ref 44–121)
BUN/Creatinine Ratio: 16 (ref 12–28)
BUN: 27 mg/dL (ref 8–27)
Bilirubin Total: 0.3 mg/dL (ref 0.0–1.2)
CO2: 23 mmol/L (ref 20–29)
Calcium: 9 mg/dL (ref 8.7–10.3)
Chloride: 102 mmol/L (ref 96–106)
Creatinine, Ser: 1.68 mg/dL — ABNORMAL HIGH (ref 0.57–1.00)
Globulin, Total: 3 g/dL (ref 1.5–4.5)
Glucose: 84 mg/dL (ref 70–99)
Potassium: 5 mmol/L (ref 3.5–5.2)
Sodium: 138 mmol/L (ref 134–144)
Total Protein: 7.1 g/dL (ref 6.0–8.5)
eGFR: 30 mL/min/{1.73_m2} — ABNORMAL LOW (ref >59)

## 2023-08-07 LAB — CBC WITH DIFFERENTIAL/PLATELET
Basophils Absolute: 0.1 10*3/uL (ref 0.0–0.2)
Basos: 1 %
EOS (ABSOLUTE): 0.4 10*3/uL (ref 0.0–0.4)
Eos: 5 %
Hematocrit: 38.7 % (ref 34.0–46.6)
Hemoglobin: 12.2 g/dL (ref 11.1–15.9)
Immature Grans (Abs): 0 10*3/uL (ref 0.0–0.1)
Immature Granulocytes: 0 %
Lymphocytes Absolute: 2 10*3/uL (ref 0.7–3.1)
Lymphs: 27 %
MCH: 28.2 pg (ref 26.6–33.0)
MCHC: 31.5 g/dL (ref 31.5–35.7)
MCV: 90 fL (ref 79–97)
Monocytes Absolute: 0.8 10*3/uL (ref 0.1–0.9)
Monocytes: 11 %
Neutrophils Absolute: 4 10*3/uL (ref 1.4–7.0)
Neutrophils: 56 %
Platelets: 252 10*3/uL (ref 150–450)
RBC: 4.32 x10E6/uL (ref 3.77–5.28)
RDW: 12.8 % (ref 11.7–15.4)
WBC: 7.2 10*3/uL (ref 3.4–10.8)

## 2023-08-07 LAB — LIPASE: Lipase: 34 U/L (ref 14–85)

## 2023-08-22 ENCOUNTER — Other Ambulatory Visit: Payer: Self-pay

## 2023-08-22 ENCOUNTER — Ambulatory Visit: Payer: Medicare PPO | Admitting: Nurse Practitioner

## 2023-08-22 ENCOUNTER — Other Ambulatory Visit (HOSPITAL_COMMUNITY)
Admission: RE | Admit: 2023-08-22 | Discharge: 2023-08-22 | Disposition: A | Discharge status: Home / Self Care | Payer: Medicare PPO | Source: Ambulatory Visit | Attending: Nurse Practitioner | Admitting: Nurse Practitioner

## 2023-08-22 ENCOUNTER — Encounter: Payer: Self-pay | Admitting: Nurse Practitioner

## 2023-08-22 VITALS — BP 130/70 | HR 71 | Temp 97.6°F | Ht 60.0 in | Wt 182.4 lb

## 2023-08-22 DIAGNOSIS — R300 Dysuria: Secondary | ICD-10-CM

## 2023-08-22 DIAGNOSIS — N760 Acute vaginitis: Secondary | ICD-10-CM | POA: Insufficient documentation

## 2023-08-22 LAB — POC URINALSYSI DIPSTICK (AUTOMATED)
Bilirubin, UA: NEGATIVE
Glucose, UA: NEGATIVE
Ketones, UA: NEGATIVE
Nitrite, UA: NEGATIVE
Protein, UA: NEGATIVE
Spec Grav, UA: 1.01 (ref 1.010–1.025)
Urobilinogen, UA: 0.2 U/dL
pH, UA: 5.5 (ref 5.0–8.0)

## 2023-08-22 MED ORDER — PHENAZOPYRIDINE HCL 100 MG PO TABS
100.0000 mg | ORAL_TABLET | Freq: Two times a day (BID) | ORAL | 0 refills | Status: DC
  Filled 2023-08-22: qty 12, 6d supply, fill #0

## 2023-08-22 MED ORDER — NYSTATIN 100000 UNIT/GM EX CREA
1.0000 | TOPICAL_CREAM | Freq: Two times a day (BID) | CUTANEOUS | 0 refills | Status: DC
  Filled 2023-08-22: qty 30, 30d supply, fill #0

## 2023-08-22 MED ORDER — FLUCONAZOLE 150 MG PO TABS
150.0000 mg | ORAL_TABLET | Freq: Once | ORAL | 0 refills | Status: AC
  Filled 2023-08-22: qty 1, 1d supply, fill #0

## 2023-08-22 NOTE — Progress Notes (Unsigned)
Established Patient Office Visit  Subjective:  Patient ID: Katherine Brooks, female    DOB: 06-Aug-1936  Age: 87 y.o. MRN: [id number]  CC: No chief complaint on file.   HPI  Katherine Brooks presents for:  Dysuria  This is a new problem. The current episode started in the past 7 days. The problem occurs every urination. The problem has been unchanged. The quality of the pain is described as burning. There has been no fever. Associated symptoms include frequency and urgency. Pertinent negatives include no discharge, flank pain, hematuria or nausea. She has tried home medications for the symptoms. The treatment provided no relief.     Past Medical History:  Diagnosis Date   Allergy    CAD (coronary artery disease)    a. 12/2021 MV: mid-dist ant, antsept ischemia; b. 12/2021 Cath: LM 60ost, 90d, LAD 90ost, 95p, 57m, D2 30, LCX 90p, RCA 90p; c. 12/2021 CABG x 4: LIMA->LAD, VG->OM, VG->Diag, VG->RPDA.   Cancer (HCC)    skin   Chronic HFrEF (heart failure with reduced ejection fraction) (HCC)    CKD (chronic kidney disease), stage III (HCC)    COVID-19 virus infection 09/2021   Facial pain    Hyperlipidemia LDL goal <70    Hypertension    Ischemic cardiomyopathy    a. 11/2021 Echo: EF 30-35%, glob HK, GrI DD, nl RV fxn, mildly dil LA, triv MR.   LBBB (left bundle branch block)     Past Surgical History:  Procedure Laterality Date   CORONARY ARTERY BYPASS GRAFT N/A 01/07/2022   Procedure: CORONARY ARTERY BYPASS GRAFTING (CABG) X FOUR BYPASSES USING OPEN LEFT INTERNAL MAMMARY ARTERY AND  ENDOSCOPIC RIGHT AND LEFT GREATER SAPHENOUS VEIN HARVEST.;  Surgeon: Alleen Borne, MD;  Location: [facility] OR;  Service: Open Heart Surgery;  Laterality: N/A;   LEFT HEART CATH AND CORONARY ANGIOGRAPHY Left 01/01/2022   Procedure: LEFT HEART CATH AND CORONARY ANGIOGRAPHY;  Surgeon: Yvonne Kendall, MD;  Location: [facility] INVASIVE CV LAB;  Service: Cardiovascular;  Laterality: Left;   salivary gland N/A 12/2012   TEE  WITHOUT CARDIOVERSION N/A 01/07/2022   Procedure: TRANSESOPHAGEAL ECHOCARDIOGRAM (TEE);  Surgeon: Alleen Borne, MD;  Location[facility] OR;  Service: Open Heart Surgery;  Laterality: N/A;    Family History  Problem Relation Age of Onset   Heart disease Mother    Lung disease Father    Cancer Sister        unknown primary   Rheum arthritis Sister    Kidney Stones Sister    COPD Brother     Social History   Socioeconomic History   Marital status: Married    Spouse name: Jake Shark   Number of children: 3   Years of education: Not on file   Highest education level: Associate degree: occupational, Scientist, product/process development, or vocational program  Occupational History    CommentArboriculturist, school system    Employer: retired  Tobacco Use   Smoking status: Never    Passive exposure: Never   Smokeless tobacco: Never  Vaping Use   Vaping status: Never Used  Substance and Sexual Activity   Alcohol use: No   Drug use: No   Sexual activity: Not Currently  Other Topics Concern   Not on file  Social History Narrative   Lives with husband   Social Drivers of Health   Financial Resource Strain: Low Risk  (05/04/2023)   Overall Financial Resource Strain (CARDIA)    Difficulty of Paying Living Expenses: Not hard  at all  Food Insecurity: No Food Insecurity (05/04/2023)   Hunger Vital Sign    Worried About Running Out of Food in the Last Year: Never true    Ran Out of Food in the Last Year: Never true  Transportation Needs: No Transportation Needs (05/04/2023)   PRAPARE - Administrator, Civil Service (Medical): No    Lack of Transportation (Non-Medical): No  Physical Activity: Sufficiently Active (05/04/2023)   Exercise Vital Sign    Days of Exercise per Week: 5 days    Minutes of Exercise per Session: 90 min  Recent Concern: Physical Activity - Inactive (04/22/2023)   Exercise Vital Sign    Days of Exercise per Week: 0 days    Minutes of Exercise per Session: 0 min  Stress: No Stress  Concern Present (05/04/2023)   [employer] of Occupational Health - Occupational Stress Questionnaire    Feeling of Stress : Not at all  Social Connections: Moderately Integrated (05/04/2023)   Social Connection and Isolation Panel [NHANES]    Frequency of Communication with Friends and Family: More than three times a week    Frequency of Social Gatherings with Friends and Family: Three times a week    Attends Religious Services: Never    Active Member of Clubs or Organizations: No    Attends Engineer, structural: More than 4 times per year    Marital Status: Married  Catering manager Violence: Not At Risk (04/23/2023)   Humiliation, Afraid, Rape, and Kick questionnaire    Fear of Current or Ex-Partner: No    Emotionally Abused: No    Physically Abused: No    Sexually Abused: No     Outpatient Medications Prior to Visit  Medication Sig Dispense Refill   Ascorbic Acid (VITAMIN C) 1000 MG tablet Take 1,000 mg by mouth daily.     aspirin EC 81 MG tablet Take 81 mg by mouth daily. Swallow whole.     atorvastatin (LIPITOR) 80 MG tablet Take 1 tablet (80 mg total) by mouth daily. 90 tablet 1   carvedilol (COREG) 3.125 MG tablet Take once daily     Cholecalciferol (VITAMIN D3) 50 MCG (2000 UT) TABS Take 1 each by mouth daily.     Cyanocobalamin (B-12) 5000 MCG CAPS Take 1 Dose by mouth daily.     ezetimibe (ZETIA) 10 MG tablet Take 1 tablet (10 mg total) by mouth daily. 90 tablet 3   furosemide (LASIX) 20 MG tablet Take 1 tablet (20 mg total) by mouth daily as needed for edema. 90 tablet 0   losartan (COZAAR) 25 MG tablet Take 1/2 tablet once daily     MAGNESIUM PO Take 400 mg by mouth daily.     No facility-administered medications prior to visit.    Allergies  Allergen Reactions   Iron     Black stool  diarrhea   Lisinopril Other (See Comments)    "passed out" Other reaction(s): Other (See Comments), Other (See Comments) "passed out" "I passed out"   Carbamazepine  Rash    ROS Review of Systems  Gastrointestinal:  Negative for nausea.  Genitourinary:  Positive for dysuria, frequency and urgency. Negative for flank pain and hematuria.   Negative unless indicated in HPI.    Objective:    Physical Exam Constitutional:      Appearance: Normal appearance.  Cardiovascular:     Rate and Rhythm: Normal rate and regular rhythm.     Pulses: Normal pulses.  Heart sounds: Normal heart sounds.  Abdominal:     General: Bowel sounds are normal.     Palpations: Abdomen is soft.     Tenderness: There is no abdominal tenderness. There is no right CVA tenderness or left CVA tenderness.  Musculoskeletal:     Cervical back: Normal range of motion.  Neurological:     General: No focal deficit present.     Mental Status: She is alert. Mental status is at baseline.  Psychiatric:        Mood and Affect: Mood normal.        Behavior: Behavior normal.        Thought Content: Thought content normal.        Judgment: Judgment normal.     There were no vitals taken for this visit. Wt Readings from Last 3 Encounters:  08/06/23 181 lb (82.1 kg)  05/08/23 171 lb (77.6 kg)  04/30/23 168 lb 8 oz (76.4 kg)     Health Maintenance  Topic Date Due   COVID-19 Vaccine (3 - Pfizer risk series) 11/08/2019   Medicare Annual Wellness (AWV)  04/22/2024   DTaP/Tdap/Td (3 - Td or Tdap) 06/13/2031   Pneumonia Vaccine 55+ Years old  Completed   INFLUENZA VACCINE  Completed   DEXA SCAN  Completed   Zoster Vaccines- Shingrix  Completed   HPV VACCINES  Aged Out    There are no preventive care reminders to display for this patient.  Lab Results  Component Value Date   TSH 3.19 11/05/2021   Lab Results  Component Value Date   WBC 7.2 08/06/2023   HGB 12.2 08/06/2023   HCT 38.7 08/06/2023   MCV 90 08/06/2023   PLT 252 08/06/2023   Lab Results  Component Value Date   NA 138 08/06/2023   K 5.0 08/06/2023   CO2 23 08/06/2023   GLUCOSE 84 08/06/2023   BUN  27 08/06/2023   CREATININE 1.68 (H) 08/06/2023   BILITOT 0.3 08/06/2023   ALKPHOS 120 08/06/2023   AST 21 08/06/2023   ALT 12 08/06/2023   PROT 7.1 08/06/2023   ALBUMIN 4.1 08/06/2023   CALCIUM 9.0 08/06/2023   ANIONGAP 9 03/26/2023   EGFR 30 (L) 08/06/2023   GFR 24.06 (L) 11/05/2021   Lab Results  Component Value Date   CHOL 123 03/27/2023   Lab Results  Component Value Date   HDL 58 03/27/2023   Lab Results  Component Value Date   LDLCALC 53 03/27/2023   Lab Results  Component Value Date   TRIG 55 03/27/2023   Lab Results  Component Value Date   CHOLHDL 2.4 08/30/2022   Lab Results  Component Value Date   HGBA1C 5.5 01/06/2022      Assessment & Plan:  Dysuria -     POCT Urinalysis Dipstick (Automated)    Follow-up: No follow-ups on file.   Kara Dies, NP

## 2023-08-22 NOTE — Patient Instructions (Signed)
Apply Nystatin cream to the affected area, use fluconazole tablet. Take pyridium as needed for pain. Will call with the swab and culture result.

## 2023-08-23 DIAGNOSIS — N760 Acute vaginitis: Secondary | ICD-10-CM | POA: Insufficient documentation

## 2023-08-23 DIAGNOSIS — R300 Dysuria: Secondary | ICD-10-CM | POA: Insufficient documentation

## 2023-08-23 LAB — URINALYSIS, ROUTINE W REFLEX MICROSCOPIC
Bacteria, UA: NONE SEEN /[HPF]
Bilirubin Urine: NEGATIVE
Glucose, UA: NEGATIVE
Hgb urine dipstick: NEGATIVE
Hyaline Cast: NONE SEEN /[LPF]
Ketones, ur: NEGATIVE
Nitrite: NEGATIVE
Protein, ur: NEGATIVE
RBC / HPF: NONE SEEN /[HPF] (ref 0–2)
Specific Gravity, Urine: 1.009 (ref 1.001–1.035)
Squamous Epithelial / HPF: NONE SEEN /[HPF] (ref <=5)
pH: 6 (ref 5.0–8.0)

## 2023-08-23 LAB — URINE CULTURE
MICRO NUMBER:: 15850097
SPECIMEN QUALITY:: ADEQUATE

## 2023-08-23 LAB — MICROSCOPIC MESSAGE

## 2023-08-23 NOTE — Assessment & Plan Note (Addendum)
Redness, swelling and discomfort to the labia during the examination. Swab pending. Will treat empirically with fluconazole 1 dose and topical nystatin cream.

## 2023-08-23 NOTE — Assessment & Plan Note (Addendum)
Urinalysis positive for trace blood and leukocyte.  Negative for nitrite. Urine culture pending. Advised to increase fluid intake.

## 2023-08-25 ENCOUNTER — Ambulatory Visit
Admission: RE | Admit: 2023-08-25 | Discharge: 2023-08-25 | Disposition: A | Discharge status: Home / Self Care | Payer: Medicare PPO | Source: Ambulatory Visit | Attending: Family | Admitting: Family

## 2023-08-25 ENCOUNTER — Encounter: Payer: Self-pay | Admitting: Family

## 2023-08-25 DIAGNOSIS — H25043 Posterior subcapsular polar age-related cataract, bilateral: Secondary | ICD-10-CM | POA: Diagnosis not present

## 2023-08-25 DIAGNOSIS — M810 Age-related osteoporosis without current pathological fracture: Secondary | ICD-10-CM | POA: Insufficient documentation

## 2023-08-25 DIAGNOSIS — H25013 Cortical age-related cataract, bilateral: Secondary | ICD-10-CM | POA: Diagnosis not present

## 2023-08-25 DIAGNOSIS — H2513 Age-related nuclear cataract, bilateral: Secondary | ICD-10-CM | POA: Diagnosis not present

## 2023-08-25 DIAGNOSIS — Z780 Asymptomatic menopausal state: Secondary | ICD-10-CM | POA: Diagnosis not present

## 2023-08-26 ENCOUNTER — Encounter: Payer: Self-pay | Admitting: Family

## 2023-08-26 DIAGNOSIS — R600 Localized edema: Secondary | ICD-10-CM | POA: Diagnosis not present

## 2023-08-26 DIAGNOSIS — N1832 Chronic kidney disease, stage 3b: Secondary | ICD-10-CM | POA: Diagnosis not present

## 2023-08-26 DIAGNOSIS — I129 Hypertensive chronic kidney disease with stage 1 through stage 4 chronic kidney disease, or unspecified chronic kidney disease: Secondary | ICD-10-CM | POA: Diagnosis not present

## 2023-08-26 LAB — CERVICOVAGINAL ANCILLARY ONLY
Bacterial Vaginitis (gardnerella): NEGATIVE
Candida Glabrata: NEGATIVE
Candida Vaginitis: NEGATIVE
Chlamydia: NEGATIVE
Comment: NEGATIVE
Comment: NEGATIVE
Comment: NEGATIVE
Comment: NEGATIVE
Comment: NEGATIVE
Comment: NORMAL
Neisseria Gonorrhea: NEGATIVE
Trichomonas: NEGATIVE

## 2023-09-01 ENCOUNTER — Other Ambulatory Visit: Payer: Self-pay | Admitting: Cardiovascular Disease

## 2023-09-01 ENCOUNTER — Other Ambulatory Visit: Payer: Self-pay | Admitting: Nurse Practitioner

## 2023-09-01 NOTE — Telephone Encounter (Signed)
Can we get her started on prolia?

## 2023-09-05 ENCOUNTER — Telehealth: Payer: Self-pay

## 2023-09-05 NOTE — Telephone Encounter (Signed)
Prolia VOB initiated via [email or web address] 

## 2023-09-10 HISTORY — PX: CATARACT EXTRACTION: SUR2

## 2023-09-12 ENCOUNTER — Other Ambulatory Visit (HOSPITAL_COMMUNITY): Payer: Self-pay

## 2023-09-12 NOTE — Telephone Encounter (Signed)
 Pharmacy Patient Advocate Encounter   Received notification from  [employer] Portal that prior authorization for PROLIA  is required/requested.   Insurance verification completed.   The patient is insured through [employer] .   Per test claim: PA required; PA submitted to above mentioned insurance via CoverMyMeds Key/confirmation #/EOC [id number] Status is pending

## 2023-09-12 NOTE — Telephone Encounter (Signed)
 Marland Kitchen

## 2023-09-16 ENCOUNTER — Other Ambulatory Visit (HOSPITAL_COMMUNITY): Payer: Self-pay

## 2023-09-16 NOTE — Telephone Encounter (Signed)
 Pt ready for scheduling for PROLIA  on or after : 09/16/23  Out-of-pocket cost due at time of visit: $40  Number of injection/visits approved: 2  Primary: [employer] Prolia  co-insurance: $40 Admin fee co-insurance: 0%  Secondary: --- Prolia  co-insurance:  Admin fee co-insurance:   Medical Benefit Details: Date Benefits were checked: 09/10/23 Deductible: NO/ Coinsurance: $40/ Admin Fee: 0%  Prior Auth: APPROVED PA# [id number] Expiration Date: 09/11/22-09/12/23   # of doses approved: 2  Pharmacy benefit: Copay $64 If patient wants fill through the pharmacy benefit please send prescription to: [employer], and include estimated need by date in rx notes. Pharmacy will ship medication directly to the office.  Patient NOT eligible for Prolia  Copay Card. Copay Card can make patient's cost as little as $25. Link to apply: [email or web address]  ** This summary of benefits is an estimation of the patient's out-of-pocket cost. Exact cost may very based on individual plan coverage.

## 2023-09-16 NOTE — Telephone Encounter (Signed)
 Pharmacy Patient Advocate Encounter  Received notification from [employer] that Prior Authorization for PROLIA  has been APPROVED from 09/11/22 to 09/12/23. Ran test claim, Copay is $64. This test claim was processed through [facility]- copay amounts may vary at other pharmacies due to pharmacy/plan contracts, or as the patient moves through the different stages of their insurance plan.   PA #/Case ID/Reference #: [id number]

## 2023-09-30 DIAGNOSIS — Z961 Presence of intraocular lens: Secondary | ICD-10-CM | POA: Diagnosis not present

## 2023-09-30 DIAGNOSIS — H25811 Combined forms of age-related cataract, right eye: Secondary | ICD-10-CM | POA: Diagnosis not present

## 2023-09-30 DIAGNOSIS — H25812 Combined forms of age-related cataract, left eye: Secondary | ICD-10-CM | POA: Diagnosis not present

## 2023-09-30 DIAGNOSIS — H25041 Posterior subcapsular polar age-related cataract, right eye: Secondary | ICD-10-CM | POA: Diagnosis not present

## 2023-09-30 DIAGNOSIS — H25011 Cortical age-related cataract, right eye: Secondary | ICD-10-CM | POA: Diagnosis not present

## 2023-09-30 DIAGNOSIS — H2511 Age-related nuclear cataract, right eye: Secondary | ICD-10-CM | POA: Diagnosis not present

## 2023-10-02 NOTE — Telephone Encounter (Signed)
Please verify authorization looks like the exp date was 09/12/23.

## 2023-10-06 NOTE — Telephone Encounter (Addendum)
Pharmacy Patient Advocate Encounter  Received notification from [facility] that Prior Authorization for PROLIA has been APPROVED from 09/12/23 to 09/08/24   PA #/Case ID/Reference #: [id number]

## 2023-10-08 ENCOUNTER — Telehealth: Payer: Self-pay | Admitting: Family

## 2023-10-08 DIAGNOSIS — M810 Age-related osteoporosis without current pathological fracture: Secondary | ICD-10-CM

## 2023-10-08 NOTE — Telephone Encounter (Signed)
Copied from CRM [id number]. Topic: Appointments - Scheduling Inquiry for Clinic >> Oct 08, 2023 12:00 PM Denese Killings wrote: Reason for CRM: Patient wants to schedule an appt for [employer].

## 2023-10-09 ENCOUNTER — Other Ambulatory Visit (INDEPENDENT_AMBULATORY_CARE_PROVIDER_SITE_OTHER): Payer: Medicare PPO

## 2023-10-09 DIAGNOSIS — M810 Age-related osteoporosis without current pathological fracture: Secondary | ICD-10-CM | POA: Diagnosis not present

## 2023-10-09 LAB — BASIC METABOLIC PANEL
BUN: 22 mg/dL (ref 6–23)
CO2: 26 meq/L (ref 19–32)
Calcium: 9.2 mg/dL (ref 8.4–10.5)
Chloride: 105 meq/L (ref 96–112)
Creatinine, Ser: 1.58 mg/dL — ABNORMAL HIGH (ref 0.40–1.20)
GFR: 29.62 mL/min — ABNORMAL LOW (ref >60.00)
Glucose, Bld: 92 mg/dL (ref 70–99)
Potassium: 4.6 meq/L (ref 3.5–5.1)
Sodium: 137 meq/L (ref 135–145)

## 2023-10-09 NOTE — Progress Notes (Signed)
Kidneys remain stable. Pt seeing nephrology. Joellen here ya go for prolia.

## 2023-10-09 NOTE — Addendum Note (Signed)
Addended by: Donnamarie Poag on: 10/09/2023 09:28 AM   Modules accepted: Orders

## 2023-10-09 NOTE — Telephone Encounter (Signed)
Appointment has been scheduled.  No further action needed at this time.

## 2023-10-09 NOTE — Telephone Encounter (Signed)
Called patient reviewed all following information including appointment, Co pay due at time of visit and if pick up of injection is needed from outside pharmacy.     Out of pocket for patient: $40  Lab appointment : 1/30  Nurse visit:  2/5  Lab order placed: Yes  Prolia has been  [x]   Ordered  []   Script sent to local pharmacy for patient to bring   []   Script sent to Specialty pharmacy   []   Script sent to [facility] to deliver

## 2023-10-14 DIAGNOSIS — H25042 Posterior subcapsular polar age-related cataract, left eye: Secondary | ICD-10-CM | POA: Diagnosis not present

## 2023-10-14 DIAGNOSIS — H25812 Combined forms of age-related cataract, left eye: Secondary | ICD-10-CM | POA: Diagnosis not present

## 2023-10-14 DIAGNOSIS — H2512 Age-related nuclear cataract, left eye: Secondary | ICD-10-CM | POA: Diagnosis not present

## 2023-10-14 DIAGNOSIS — Z961 Presence of intraocular lens: Secondary | ICD-10-CM | POA: Diagnosis not present

## 2023-10-14 DIAGNOSIS — H25012 Cortical age-related cataract, left eye: Secondary | ICD-10-CM | POA: Diagnosis not present

## 2023-10-15 ENCOUNTER — Ambulatory Visit: Payer: Medicare PPO

## 2023-10-15 DIAGNOSIS — M810 Age-related osteoporosis without current pathological fracture: Secondary | ICD-10-CM | POA: Diagnosis not present

## 2023-10-15 MED ORDER — DENOSUMAB 60 MG/ML ~~LOC~~ SOSY
60.0000 mg | PREFILLED_SYRINGE | Freq: Once | SUBCUTANEOUS | Status: AC
  Administered 2024-04-20 (×2): 60 mg via SUBCUTANEOUS

## 2023-10-15 MED ORDER — DENOSUMAB 60 MG/ML ~~LOC~~ SOSY
60.0000 mg | PREFILLED_SYRINGE | Freq: Once | SUBCUTANEOUS | Status: AC
  Administered 2023-10-15: 60 mg via SUBCUTANEOUS

## 2023-10-15 NOTE — Progress Notes (Signed)
 Per orders of Katherine Patrick, NP, injection of prolia  60 mg [place] given by Laray Arenas in left arm. Patient tolerated injection well. Patient will make appointment for 6 month. This was first prolia  injection and pt waited 15 mins after injection without problem or complaint.

## 2023-10-15 NOTE — Addendum Note (Signed)
 Addended by: Katheleen Palmer on: 10/15/2023 11:28 AM   Modules accepted: Orders

## 2023-11-03 ENCOUNTER — Telehealth: Payer: Self-pay | Admitting: Family

## 2023-11-03 ENCOUNTER — Other Ambulatory Visit: Payer: Self-pay

## 2023-11-03 NOTE — Telephone Encounter (Signed)
 Attempted to contact pt. There was no answer or option to leave a message.

## 2023-11-03 NOTE — Telephone Encounter (Signed)
 This was last filled by Olean Ree, NP on 04/27/20.

## 2023-11-03 NOTE — Telephone Encounter (Signed)
 Copied from CRM [id number]. Topic: Clinical - Medication Refill >> Nov 03, 2023  2:26 PM Maxwell Marion wrote: Most Recent Primary Care Visit:  Provider: Patience Musca  Department: [facility]  Visit Type: CLINICAL SUPPORT  Date: 10/15/2023  Medication: carbamazepine (TEGRETOL) 200 MG, patient's daughter said the medication isn't listed as a current medication because she only uses the medication when she is having a flare up and the patient is currently having a flare up and needing a refill   Has the patient contacted their pharmacy?  (Agent: If no, request that the patient contact the pharmacy for the refill. If patient does not wish to contact the pharmacy document the reason why and proceed with request.) (Agent: If yes, when and what did the pharmacy advise?)  Is this the correct pharmacy for this prescription? Yes If no, delete pharmacy and type the correct one.  This is the patient's preferred pharmacy:   [facility] [address] [place] [address] Phone: [phone] Fax: [phone]   Has the prescription been filled recently? No  Is the patient out of the medication? Yes  Has the patient been seen for an appointment in the last year OR does the patient have an upcoming appointment? Yes  Can we respond through MyChart? Yes  Agent: Please be advised that Rx refills may take up to 3 business days. We ask that you follow-up with your pharmacy.

## 2023-11-03 NOTE — Telephone Encounter (Signed)
 I looked back in the notes and pt did see neurology in 2021 for trigeminal neuralgia but has not followed up since. What are patients symptoms at current? Several symptoms can also mimic other more problematic things. Does she still see neurology?   I can not fill tegretol. Per notes pt was taking and developed a rash so stopped the medications. She then tried a few months later to take again and developed another rash.  Per neurology note, if another flare she was to follow up with neurology for next course of direction  Have pt make appt to be seen so we can direct to appropriate plan of action.

## 2023-11-03 NOTE — Telephone Encounter (Signed)
 Spoke with pt's daughter, Clydie Braun. She is aware of Tabitha's message. She will reach out to Neurology.

## 2023-11-28 ENCOUNTER — Telehealth: Payer: Self-pay | Admitting: Family

## 2023-11-28 NOTE — Telephone Encounter (Signed)
 Copied from CRM [id number]. Topic: Referral - Request for Referral >> Nov 28, 2023  8:23 AM Isabell A wrote: Did the patient discuss referral with their provider in the last year? Yes (If No - schedule appointment) (If Yes - send message)  Appointment offered? N/A  Type of order/referral and detailed reason for visit: Neurology   Preference of office, provider, location:  [facility] [address], [place], [place] [address]   If referral order, have you been seen by this specialty before? Yes (If Yes, this issue or another issue? When? Where? Yes, about 3 years ago for trigeminal neuralgia.   Can we respond through MyChart? No, please contact daughter Elease Hashimoto [phone]

## 2023-11-28 NOTE — Telephone Encounter (Signed)
 There is open message for patient from 2/24 regarding this as well. Please review that message. Looks like you had discussed appointment and she was going to reach out to old neurologist and let us know if referral was needed.

## 2023-12-02 ENCOUNTER — Other Ambulatory Visit: Payer: Self-pay | Admitting: Family

## 2023-12-02 NOTE — Telephone Encounter (Signed)
 Insurance needs updated office visit for documentation for this prior to being able to write referral.  Schedule.

## 2023-12-02 NOTE — Telephone Encounter (Signed)
 Patient insurance is current. She has [employer] still.

## 2023-12-03 NOTE — Telephone Encounter (Signed)
 Patient has been scheduled

## 2023-12-03 NOTE — Telephone Encounter (Signed)
 Katherine Brooks, I wrote that really poorly.  She will need an office to document the reasons for referral since has been some time, this is for insurance purposes and for the provider we are referring to to see the recent note in regards to symptoms she is currently experiencing.   Please schedule.

## 2023-12-04 ENCOUNTER — Encounter: Payer: Self-pay | Admitting: *Deleted

## 2023-12-04 ENCOUNTER — Ambulatory Visit: Admitting: Family

## 2023-12-04 ENCOUNTER — Other Ambulatory Visit: Payer: Self-pay

## 2023-12-04 ENCOUNTER — Encounter: Payer: Self-pay | Admitting: Family

## 2023-12-04 ENCOUNTER — Telehealth: Payer: Self-pay | Admitting: Family

## 2023-12-04 VITALS — BP 118/62 | HR 73 | Temp 98.3°F | Ht 60.5 in | Wt 184.8 lb

## 2023-12-04 DIAGNOSIS — G500 Trigeminal neuralgia: Secondary | ICD-10-CM

## 2023-12-04 MED ORDER — PREGABALIN 25 MG PO CAPS
25.0000 mg | ORAL_CAPSULE | Freq: Two times a day (BID) | ORAL | 0 refills | Status: DC
  Filled 2023-12-04 (×2): qty 60, 30d supply, fill #0

## 2023-12-04 NOTE — Progress Notes (Signed)
 Established Patient Office Visit  Subjective:      CC:  Chief Complaint  Patient presents with   Medical Management of Chronic Issues    Needs a neurology referral    HPI: Katherine Brooks is a 88 y.o. female presenting on 12/04/2023 for Medical Management of Chronic Issues (Needs a neurology referral) . H/o trigeminal neuralgia, saw neurology back in 2021. She had requested refill for tegretol however after looking at notes there was mention that a rash developed.   Note from three years ago, was also on gabapentin tid however was not helping and the pain was dull and severe intermittently in the left cheek bone area.   New complaints:  pt in with c/o left sided nerve pain again, very sensitive on the left side and hard to even touch that side or put on makeup or anything of the sort. Has gradually worsened in the last few weeks, started initially about one month ago. Prior to this she has not had an episode since 2021 when she was seeing neurology. MRI brain w/o contrast with left superior cerebellar artery that suspected to have mass effect on left trigeminal nerve root entry zone. Lasting for a few seconds at a time.   She went to the dentist for this as well a few weeks ago, and the dentist did an xray and they ruled out dental concern. Per her, the dentist said exam was unremarkable.      Social history:  Relevant past medical, surgical, family and social history reviewed and updated as indicated. Interim medical history since our last visit reviewed.  Allergies and medications reviewed and updated.  DATA REVIEWED: CHART IN EPIC     ROS: Negative unless specifically indicated above in HPI.    Current Outpatient Medications:    aspirin EC 81 MG tablet, Take 81 mg by mouth daily. Swallow whole., Disp: , Rfl:    atorvastatin (LIPITOR) 80 MG tablet, TAKE 1 TABLET EVERY DAY, Disp: 90 tablet, Rfl: 3   carvedilol (COREG) 3.125 MG tablet, Take once daily, Disp: , Rfl:     Cholecalciferol (VITAMIN D3) 50 MCG (2000 UT) TABS, Take 1 each by mouth daily., Disp: , Rfl:    Cyanocobalamin (B-12) 5000 MCG CAPS, Take 1 Dose by mouth daily., Disp: , Rfl:    ezetimibe (ZETIA) 10 MG tablet, Take 1 tablet (10 mg total) by mouth daily., Disp: 90 tablet, Rfl: 3   ferrous sulfate 325 (65 FE) MG tablet, Take 325 mg by mouth daily with breakfast., Disp: , Rfl:    furosemide (LASIX) 20 MG tablet, TAKE 1 TABLET EVERY DAY AS NEEDED FOR EDEMA, Disp: 90 tablet, Rfl: 3   MAGNESIUM PO, Take 400 mg by mouth daily., Disp: , Rfl:    Multiple Vitamins-Minerals (WOMENS 50+ MULTI VITAMIN PO), Take by mouth daily., Disp: , Rfl:    pregabalin (LYRICA) 25 MG capsule, Take 2-4 capsules (50-100 mg total) by mouth 2 (two) times daily., Disp: 240 capsule, Rfl: 6  Current Facility-Administered Medications:    [START ON 04/13/2024] denosumab (PROLIA) injection 60 mg, 60 mg, Subcutaneous, Once, Kimbra Marcelino, FNP      Objective:    BP 118/62 (BP Location: Left Arm, Patient Position: Sitting, Cuff Size: Normal)   Pulse 73   Temp 98.3 F (36.8 C) (Temporal)   Ht 5' 0.5" (1.537 m)   Wt 184 lb 12.8 oz (83.8 kg)   SpO2 98%   BMI 35.50 kg/m   Wt Readings from Last  3 Encounters:  12/09/23 184 lb (83.5 kg)  12/04/23 184 lb 12.8 oz (83.8 kg)  08/22/23 182 lb 6.4 oz (82.7 kg)    Physical Exam Constitutional:      General: She is not in acute distress.    Appearance: Normal appearance. She is normal weight. She is not ill-appearing, toxic-appearing or diaphoretic.  HENT:     Head: Normocephalic.     Jaw: No tenderness or pain on movement.  Cardiovascular:     Rate and Rhythm: Normal rate.  Pulmonary:     Effort: Pulmonary effort is normal.  Musculoskeletal:        General: Normal range of motion.  Neurological:     General: No focal deficit present.     Mental Status: She is alert and oriented to person, place, and time. Mental status is at baseline.     Cranial Nerves: Cranial nerves 2-12  are intact. No cranial nerve deficit or facial asymmetry.     Sensory: Sensation is intact. Sensory deficit: light tingling sensation upon light brushing of left side of face specifically left outer cheek extending to left ear and jaw.     Motor: Motor function is intact.     Coordination: Coordination is intact.  Psychiatric:        Mood and Affect: Mood normal.        Behavior: Behavior normal.        Thought Content: Thought content normal.        Judgment: Judgment normal.           Assessment & Plan:  Trigeminal neuralgia of left side of face Assessment & Plan: Reviewed notes from past  Referral placed for neurology as well for eval/treat  Trial start lyrica 25 mg twice daily.  Pdmp reviewed Discussed with pt s/e of possible drowsiness   Orders: -     Ambulatory referral to Neurology     Return if symptoms worsen or fail to improve.  Mort Sawyers, MSN, APRN, FNP-C [place] [facility]

## 2023-12-09 ENCOUNTER — Encounter: Payer: Self-pay | Admitting: Diagnostic Neuroimaging

## 2023-12-09 ENCOUNTER — Ambulatory Visit: Admitting: Diagnostic Neuroimaging

## 2023-12-09 VITALS — BP 148/67 | HR 68 | Ht 60.0 in | Wt 184.0 lb

## 2023-12-09 DIAGNOSIS — G500 Trigeminal neuralgia: Secondary | ICD-10-CM | POA: Insufficient documentation

## 2023-12-09 MED ORDER — PREGABALIN 25 MG PO CAPS: 50.0000 mg | ORAL_CAPSULE | Freq: Two times a day (BID) | ORAL | 6 refills | Status: DC

## 2023-12-09 NOTE — Assessment & Plan Note (Addendum)
 Reviewed notes from past  Referral placed for neurology as well for eval/treat  Trial start lyrica 25 mg twice daily.  Pdmp reviewed Discussed with pt s/e of possible drowsiness

## 2023-12-09 NOTE — Patient Instructions (Signed)
  LEFT V2 trigeminal neuralgia (idiopathic; possible microvascular compression) - may increase pregabalin to 50mg  twice a day; then 75mg  twice a day and then 100mg  twice a day  -If symptoms significant worsen or become intractable, then could consider gamma knife for microvascular decompression referral

## 2023-12-09 NOTE — Progress Notes (Signed)
 [facility]  PATIENT: Katherine Brooks DOB: [birth year]  REFERRING CLINICIAN: Mort Sawyers, FNP HISTORY FROM: patient REASON FOR VISIT: new consult   HISTORICAL  CHIEF COMPLAINT:  Chief Complaint  Patient presents with   Facial Pain    Rm 6 alone  Pt is well, reports she has been having L sided shooting facial pain since last visit in 2021. Has occasional trouble with opening her mouth. She has went to dentist and been cleared.     HISTORY OF PRESENT ILLNESS:   UPDATE (12/09/23, VRP): Since last visit, doing well after last flare up which got better by 2022. CBZ caused some rash, then tried gabapentin and did well. Then 1 month ago had flare up of left maxillary pain returned. Had some old gabapentin, but did not help. Now on pregabalin 25mg  twice a day and this is helping a little.  PRIOR HPI (07/10/20, VRP): 88 year old female here for evaluation of trigeminal neuralgia.  March 27, 2020 patient had onset of left maxillary sharp electrical pain sensations.  Symptoms aggravated by touching or brushing her teeth.  Pain was quite severe.  No other associated symptoms.  No triggering aggravating factors otherwise noted.  Patient was tried on gabapentin which did not help.  She was changed to carbamazepine which significantly improved symptoms.  Now patient on 200 mg twice a day which is have resolved symptoms.  However patient does have some fatigue and jittery sensations.  Patient has been on this dose for the past 11 days.    REVIEW OF SYSTEMS: Full 14 system review of systems performed and negative with exception of: As per HPI.  ALLERGIES: Allergies  Allergen Reactions   Lisinopril Other (See Comments)    "passed out" Other reaction(s): Other (See Comments), Other (See Comments) "passed out" "I passed out"   Carbamazepine Rash    And itching    HOME MEDICATIONS: Outpatient Medications Prior to Visit  Medication Sig Dispense Refill   aspirin EC 81 MG  tablet Take 81 mg by mouth daily. Swallow whole.     atorvastatin (LIPITOR) 80 MG tablet TAKE 1 TABLET EVERY DAY 90 tablet 3   carvedilol (COREG) 3.125 MG tablet Take once daily     Cholecalciferol (VITAMIN D3) 50 MCG (2000 UT) TABS Take 1 each by mouth daily.     Cyanocobalamin (B-12) 5000 MCG CAPS Take 1 Dose by mouth daily.     ezetimibe (ZETIA) 10 MG tablet Take 1 tablet (10 mg total) by mouth daily. 90 tablet 3   ferrous sulfate 325 (65 FE) MG tablet Take 325 mg by mouth daily with breakfast.     furosemide (LASIX) 20 MG tablet TAKE 1 TABLET EVERY DAY AS NEEDED FOR EDEMA 90 tablet 3   MAGNESIUM PO Take 400 mg by mouth daily.     Multiple Vitamins-Minerals (WOMENS 50+ MULTI VITAMIN PO) Take by mouth daily.     pregabalin (LYRICA) 25 MG capsule Take 1 capsule (25 mg total) by mouth 2 (two) times daily. 60 capsule 0   Ascorbic Acid (VITAMIN C) 1000 MG tablet Take 1,000 mg by mouth daily.     Facility-Administered Medications Prior to Visit  Medication Dose Route Frequency Provider Last Rate Last Admin   [START ON 04/13/2024] denosumab (PROLIA) injection 60 mg  60 mg Subcutaneous Once Mort Sawyers, FNP        PAST MEDICAL HISTORY: Past Medical History:  Diagnosis Date   Allergy    CAD (coronary artery disease)  a. 12/2021 MV: mid-dist ant, antsept ischemia; b. 12/2021 Cath: LM 60ost, 90d, LAD 90ost, 95p, 51m, D2 30, LCX 90p, RCA 90p; c. 12/2021 CABG x 4: LIMA->LAD, VG->OM, VG->Diag, VG->RPDA.   Cancer (HCC)    skin   Chronic HFrEF (heart failure with reduced ejection fraction) (HCC)    CKD (chronic kidney disease), stage III (HCC)    COVID-19 virus infection 09/2021   Facial pain    Hyperlipidemia LDL goal <70    Hypertension    Ischemic cardiomyopathy    a. 11/2021 Echo: EF 30-35%, glob HK, GrI DD, nl RV fxn, mildly dil LA, triv MR.   LBBB (left bundle branch block)     PAST SURGICAL HISTORY: Past Surgical History:  Procedure Laterality Date   CATARACT EXTRACTION Bilateral  2025   CORONARY ARTERY BYPASS GRAFT N/A 01/07/2022   Procedure: CORONARY ARTERY BYPASS GRAFTING (CABG) X FOUR BYPASSES USING OPEN LEFT INTERNAL MAMMARY ARTERY AND  ENDOSCOPIC RIGHT AND LEFT GREATER SAPHENOUS VEIN HARVEST.;  Surgeon: Alleen Borne, MD;  Location: [facility] OR;  Service: Open Heart Surgery;  Laterality: N/A;   LEFT HEART CATH AND CORONARY ANGIOGRAPHY Left 01/01/2022   Procedure: LEFT HEART CATH AND CORONARY ANGIOGRAPHY;  Surgeon: Yvonne Kendall, MD;  Location: [facility] INVASIVE CV LAB;  Service: Cardiovascular;  Laterality: Left;   salivary gland N/A 12/08/2012   TEE WITHOUT CARDIOVERSION N/A 01/07/2022   Procedure: TRANSESOPHAGEAL ECHOCARDIOGRAM (TEE);  Surgeon: Alleen Borne, MD;  Location: [facility] OR;  Service: Open Heart Surgery;  Laterality: N/A;    FAMILY HISTORY: Family History  Problem Relation Age of Onset   Heart disease Mother    Lung disease Father    Cancer Sister        unknown primary   Rheum arthritis Sister    Kidney Stones Sister    COPD Brother     SOCIAL HISTORY: Social History   Socioeconomic History   Marital status: Married    Spouse name: Jake Shark   Number of children: 3   Years of education: Not on file   Highest education level: Associate degree: occupational, Scientist, product/process development, or vocational program  Occupational History    CommentArboriculturist, school system    Employer: retired  Tobacco Use   Smoking status: Never    Passive exposure: Never   Smokeless tobacco: Never  Vaping Use   Vaping status: Never Used  Substance and Sexual Activity   Alcohol use: No   Drug use: No   Sexual activity: Not Currently  Other Topics Concern   Not on file  Social History Narrative   Lives with husband   Social Drivers of Health   Financial Resource Strain: Low Risk  (05/04/2023)   Overall Financial Resource Strain (CARDIA)    Difficulty of Paying Living Expenses: Not hard at all  Food Insecurity: No Food Insecurity (05/04/2023)   Hunger Vital Sign    Worried  About Running Out of Food in the Last Year: Never true    Ran Out of Food in the Last Year: Never true  Transportation Needs: No Transportation Needs (05/04/2023)   PRAPARE - Administrator, Civil Service (Medical): No    Lack of Transportation (Non-Medical): No  Physical Activity: Sufficiently Active (05/04/2023)   Exercise Vital Sign    Days of Exercise per Week: 5 days    Minutes of Exercise per Session: 90 min  Recent Concern: Physical Activity - Inactive (04/22/2023)   Exercise Vital Sign    Days of Exercise  per Week: 0 days    Minutes of Exercise per Session: 0 min  Stress: No Stress Concern Present (05/04/2023)   [employer] of Occupational Health - Occupational Stress Questionnaire    Feeling of Stress : Not at all  Social Connections: Moderately Integrated (05/04/2023)   Social Connection and Isolation Panel [NHANES]    Frequency of Communication with Friends and Family: More than three times a week    Frequency of Social Gatherings with Friends and Family: Three times a week    Attends Religious Services: Never    Active Member of Clubs or Organizations: No    Attends Engineer, structural: More than 4 times per year    Marital Status: Married  Catering manager Violence: Not At Risk (04/23/2023)   Humiliation, Afraid, Rape, and Kick questionnaire    Fear of Current or Ex-Partner: No    Emotionally Abused: No    Physically Abused: No    Sexually Abused: No     PHYSICAL EXAM  GENERAL EXAM/CONSTITUTIONAL: Vitals:  Vitals:   12/09/23 0924  BP: (!) 148/67  Pulse: 68  Weight: 184 lb (83.5 kg)  Height: 5' (1.524 m)   Body mass index is 35.94 kg/m. Wt Readings from Last 3 Encounters:  12/09/23 184 lb (83.5 kg)  12/04/23 184 lb 12.8 oz (83.8 kg)  08/22/23 182 lb 6.4 oz (82.7 kg)   Patient is in no distress; well developed, nourished and groomed; neck is supple  CARDIOVASCULAR: Examination of carotid arteries is normal; no carotid  bruits Regular rate and rhythm, no murmurs Examination of peripheral vascular system by observation and palpation is normal  EYES: Ophthalmoscopic exam of optic discs and posterior segments is normal; no papilledema or hemorrhages No results found.  MUSCULOSKELETAL: Gait, strength, tone, movements noted in Neurologic exam below  NEUROLOGIC: MENTAL STATUS:     11/08/2020    1:13 PM 07/05/2019   11:21 AM  MMSE - Mini Mental State Exam  Not completed: Refused   Orientation to time  5  Orientation to Place  5  Registration  3  Attention/ Calculation  5  Recall  3  Language- repeat  1   awake, alert, oriented to person, place and time recent and remote memory intact normal attention and concentration language fluent, comprehension intact, naming intact fund of knowledge appropriate  CRANIAL NERVE:  2nd - no papilledema on fundoscopic exam 2nd, 3rd, 4th, 6th - pupils equal and reactive to light, visual fields full to confrontation, extraocular muscles intact, no nystagmus 5th - facial sensation symmetric 7th - facial strength symmetric 8th - hearing intact 9th - palate elevates symmetrically, uvula midline 11th - shoulder shrug symmetric 12th - tongue protrusion midline  MOTOR:  normal bulk and tone, full strength in the BUE, BLE  SENSORY:  normal and symmetric to light touch, temperature, vibration  COORDINATION:  finger-nose-finger, fine finger movements normal  REFLEXES:  deep tendon reflexes TRACE and symmetric  GAIT/STATION:  narrow based gait     DIAGNOSTIC DATA (LABS, IMAGING, TESTING) - I reviewed patient records, labs, notes, testing and imaging myself where available.  Lab Results  Component Value Date   WBC 7.2 08/06/2023   HGB 12.2 08/06/2023   HCT 38.7 08/06/2023   MCV 90 08/06/2023   PLT 252 08/06/2023      Component Value Date/Time   NA 137 10/09/2023 1131   NA 138 08/06/2023 1119   K 4.6 10/09/2023 1131   CL 105 10/09/2023 1131  CO2 26 10/09/2023 1131   GLUCOSE 92 10/09/2023 1131   BUN 22 10/09/2023 1131   BUN 27 08/06/2023 1119   CREATININE 1.58 (H) 10/09/2023 1131   CREATININE 1.29 (H) 03/01/2016 0924   CALCIUM 9.2 10/09/2023 1131   PROT 7.1 08/06/2023 1119   ALBUMIN 4.1 08/06/2023 1119   AST 21 08/06/2023 1119   ALT 12 08/06/2023 1119   ALKPHOS 120 08/06/2023 1119   BILITOT 0.3 08/06/2023 1119   GFRNONAA 27 (L) 03/26/2023 0956   GFRNONAA 40 (L) 03/01/2016 0924   GFRAA 42 (L) 03/17/2018 0416   GFRAA 46 (L) 03/01/2016 0924   Lab Results  Component Value Date   CHOL 123 03/27/2023   HDL 58 03/27/2023   LDLCALC 53 03/27/2023   TRIG 55 03/27/2023   CHOLHDL 2.4 08/30/2022   Lab Results  Component Value Date   HGBA1C 5.5 01/06/2022   Lab Results  Component Value Date   VITAMINB12 446 05/08/2023   Lab Results  Component Value Date   TSH 3.19 11/05/2021    03/30/20 MRI brain (with and without) [I reviewed images myself and agree with interpretation. -VRP]  - No mass or abnormal enhancement. The left superior cerebellar artery may have mass effect on the left trigeminal nerve root entry zone.     ASSESSMENT AND PLAN  88 y.o. year old female here with:  Meds tried: CBZ (rash), gabapentin, pregabalin  Dx:  1. Left-sided trigeminal neuralgia   2. Trigeminal neuralgia of left side of face     PLAN:  LEFT V2 trigeminal neuralgia (idiopathic; possible microvascular compression) - may increase pregabalin to 50mg  twice a day; then 75mg  twice a day and then 100mg  twice a day  -If symptoms significant worsen or become intractable, then could consider gamma knife for microvascular decompression referral  Meds ordered this encounter  Medications   pregabalin (LYRICA) 25 MG capsule    Sig: Take 2-4 capsules (50-100 mg total) by mouth 2 (two) times daily.    Dispense:  240 capsule    Refill:  6   Return in about 4 months (around 04/09/2024) for MyChart visit (15 min).    Suanne Marker,  MD 12/09/2023, 9:50 AM Certified in Neurology, Neurophysiology and Neuroimaging  [facility] [address] [place], [place] [address] [phone]

## 2023-12-10 NOTE — Telephone Encounter (Signed)
 erroneous

## 2023-12-12 ENCOUNTER — Other Ambulatory Visit: Payer: Self-pay

## 2023-12-12 MED ORDER — AMOXICILLIN 500 MG PO CAPS
500.0000 mg | ORAL_CAPSULE | Freq: Four times a day (QID) | ORAL | 0 refills | Status: DC
  Filled 2023-12-12: qty 30, 8d supply, fill #0

## 2023-12-14 ENCOUNTER — Encounter: Payer: Self-pay | Admitting: Diagnostic Neuroimaging

## 2023-12-14 DIAGNOSIS — G500 Trigeminal neuralgia: Secondary | ICD-10-CM

## 2023-12-15 ENCOUNTER — Other Ambulatory Visit: Payer: Self-pay

## 2023-12-15 ENCOUNTER — Other Ambulatory Visit: Payer: Self-pay | Admitting: Cardiovascular Disease

## 2023-12-15 DIAGNOSIS — I5020 Unspecified systolic (congestive) heart failure: Secondary | ICD-10-CM

## 2023-12-15 DIAGNOSIS — Z8679 Personal history of other diseases of the circulatory system: Secondary | ICD-10-CM

## 2023-12-15 MED ORDER — BACLOFEN 10 MG PO TABS
5.0000 mg | ORAL_TABLET | Freq: Three times a day (TID) | ORAL | 0 refills | Status: DC | PRN
  Filled 2023-12-15: qty 30, 10d supply, fill #0

## 2023-12-15 MED ORDER — PREGABALIN 150 MG PO CAPS
150.0000 mg | ORAL_CAPSULE | Freq: Two times a day (BID) | ORAL | 5 refills | Status: DC
  Filled 2023-12-15: qty 60, 30d supply, fill #0

## 2023-12-15 NOTE — Telephone Encounter (Signed)
 Continue pregabalin; may increase up to 150mg  twice a day.  Also may add baclofen 5-10mg  three times a day.  Meds ordered this encounter  Medications   baclofen (LIORESAL) 10 MG tablet    Sig: Take 0.5-1 tablets (5-10 mg total) by mouth 3 (three) times daily as needed for muscle spasms.    Dispense:  30 each    Refill:  0   pregabalin (LYRICA) 150 MG capsule    Sig: Take 1 capsule (150 mg total) by mouth 2 (two) times daily.    Dispense:  60 capsule    Refill:  5   Suanne Marker, MD 12/15/2023, 4:53 PM Certified in Neurology, Neurophysiology and Neuroimaging  [facility] [address], [address] [place], [place] [address] [phone]

## 2023-12-16 ENCOUNTER — Other Ambulatory Visit: Payer: Self-pay

## 2023-12-16 DIAGNOSIS — G500 Trigeminal neuralgia: Secondary | ICD-10-CM

## 2023-12-16 MED ORDER — PHENYTOIN SODIUM EXTENDED 100 MG PO CAPS
100.0000 mg | ORAL_CAPSULE | Freq: Every day | ORAL | 6 refills | Status: DC
  Filled 2023-12-16: qty 90, 30d supply, fill #0

## 2023-12-16 MED ORDER — PREGABALIN 75 MG PO CAPS: 75.0000 mg | ORAL_CAPSULE | Freq: Two times a day (BID) | ORAL | Status: DC

## 2023-12-16 NOTE — Addendum Note (Signed)
 Addended by: Joycelyn Schmid R on: 12/16/2023 04:02 PM   Modules accepted: Orders

## 2023-12-16 NOTE — Telephone Encounter (Signed)
 Noted. Stop baclofen. Maximum pregabalin should be 75mg  twice a day given her renal function.  Can try phenytoin 100mg  daily; increase up to 200 or 300mg  daily.   Meds ordered this encounter  Medications   DISCONTD: baclofen (LIORESAL) 10 MG tablet    Sig: Take 0.5-1 tablets (5-10 mg total) by mouth 3 (three) times daily as needed for muscle spasms.    Dispense:  30 each    Refill:  0   DISCONTD: pregabalin (LYRICA) 150 MG capsule    Sig: Take 1 capsule (150 mg total) by mouth 2 (two) times daily.    Dispense:  60 capsule    Refill:  5   pregabalin (LYRICA) 75 MG capsule    Sig: Take 1 capsule (75 mg total) by mouth 2 (two) times daily.   phenytoin (DILANTIN) 100 MG ER capsule    Sig: Take 1-3 capsules (100-300 mg total) by mouth at bedtime.    Dispense:  90 capsule    Refill:  6   Suanne Marker, MD 12/16/2023, 4:01 PM Certified in Neurology, Neurophysiology and Neuroimaging  [facility] [address], [address] [place], [place] [address] [phone]

## 2023-12-16 NOTE — Telephone Encounter (Signed)
 Pt's daughter, Elease Hashimoto said to send Baclofen to the pharmacy that had discussed with you, [facility].

## 2023-12-16 NOTE — Telephone Encounter (Signed)
 PT DAUGHTER STATED NEEDS CALLED TO LOCAL PHARMACY AND NEEDS PA ROUTING TO DR. PENUMALLIS AND TO RX PA TEAM TO GET STARTED ON PA. TOOK LAST DOSE THIS AM

## 2023-12-17 ENCOUNTER — Other Ambulatory Visit: Payer: Self-pay | Admitting: Neurology

## 2023-12-17 ENCOUNTER — Other Ambulatory Visit: Payer: Self-pay

## 2023-12-17 DIAGNOSIS — G500 Trigeminal neuralgia: Secondary | ICD-10-CM

## 2023-12-17 MED ORDER — PREGABALIN 75 MG PO CAPS
75.0000 mg | ORAL_CAPSULE | Freq: Two times a day (BID) | ORAL | 5 refills | Status: DC
  Filled 2023-12-17: qty 60, 30d supply, fill #0

## 2023-12-19 ENCOUNTER — Telehealth: Payer: Self-pay | Admitting: Diagnostic Neuroimaging

## 2023-12-19 ENCOUNTER — Other Ambulatory Visit: Payer: Self-pay | Admitting: Neurology

## 2023-12-19 NOTE — Telephone Encounter (Signed)
 Lindsey(pharmacy tech) @ [facility] has called reporting that a PA is needed for Lyrica 25 mg

## 2023-12-19 NOTE — Telephone Encounter (Signed)
 PA is not needed as the patient was able to get the 75 mg dose filled on 12/17/23.

## 2023-12-20 IMAGING — DX DG CHEST 1V
1 series · 1 of 1 positions shown · non-contrast
Comparison: Chest x-ray from same day at 4044 hours.

CLINICAL DATA: Status post CABG.

EXAM:
CHEST  1 VIEW

[chest]
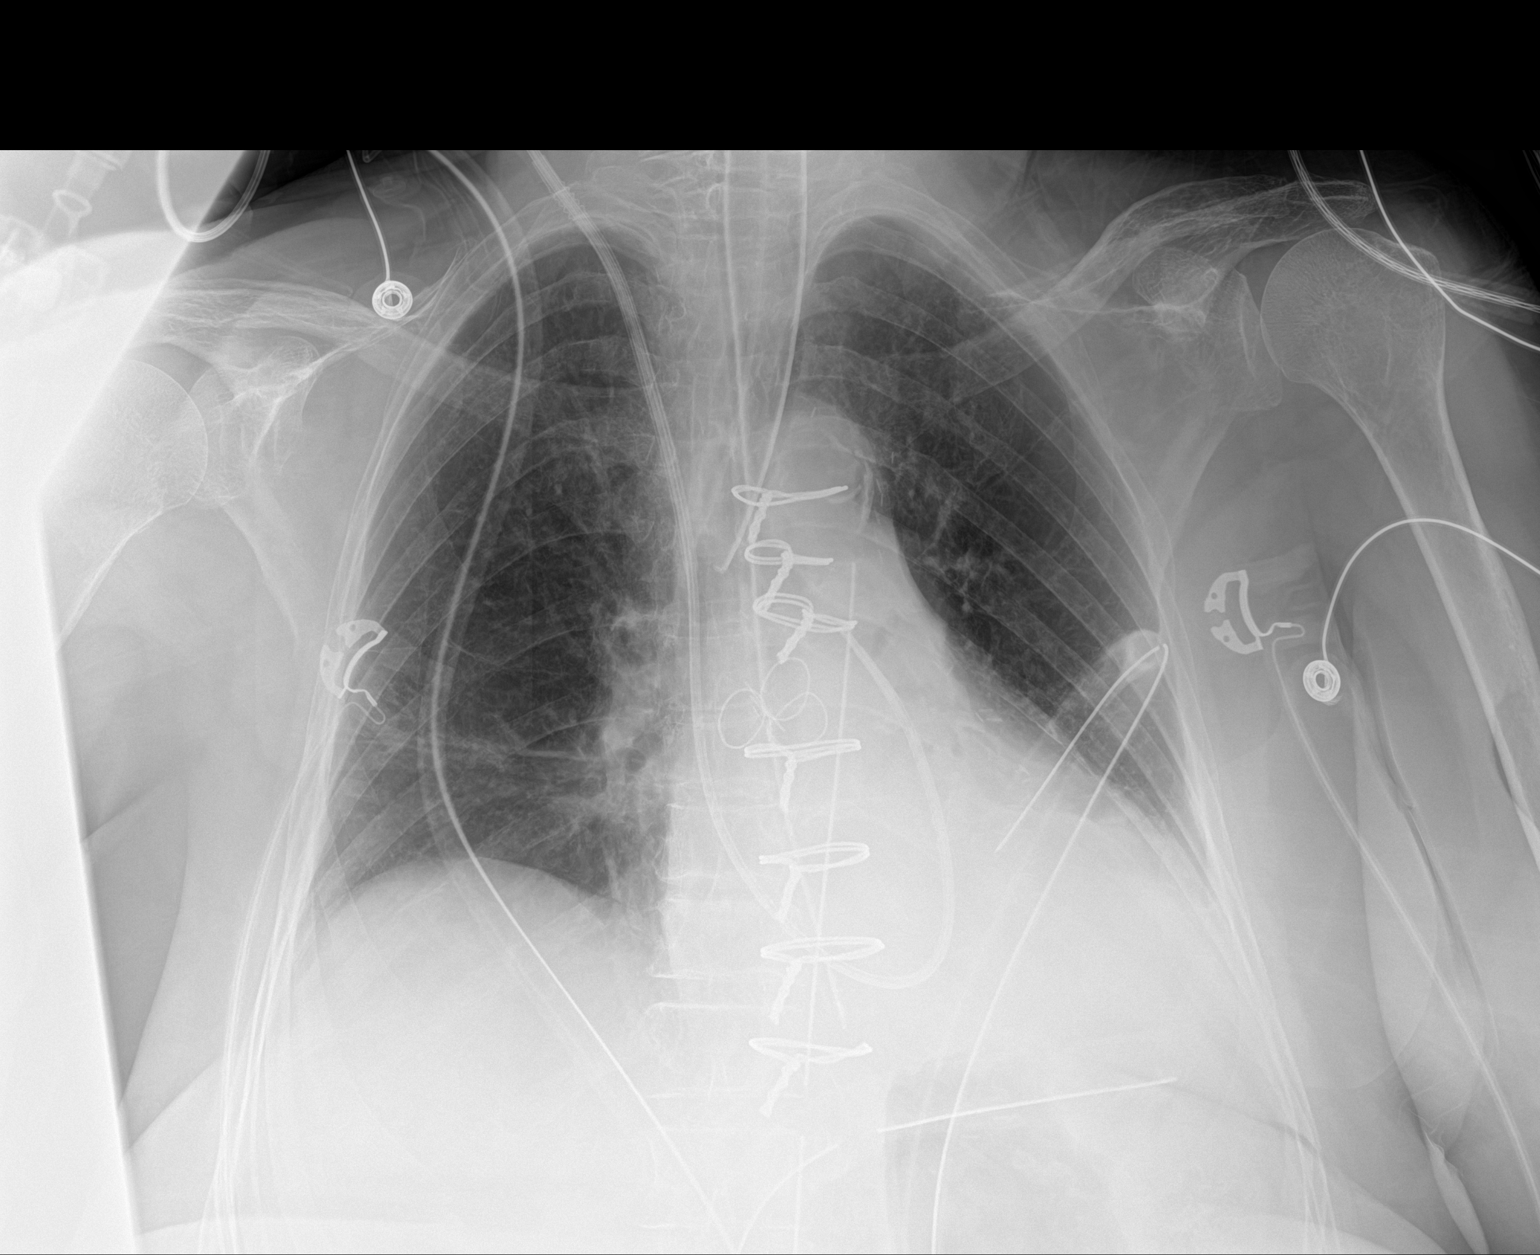

[1 of 1 positions shown; findings below may reference images not displayed]

FINDINGS: Endotracheal tube remains at the level of the carina directed
towards the right mainstem bronchus. Enteric tube tip remains in the
distal esophagus near the GE junction. Right internal jugular
Swan-Ganz catheter with the tip in the main pulmonary outflow tract.
Mediastinal and left chest tubes are in good position.

Normal heart size status post CABG. Unchanged left basilar opacity
with probable small left pleural effusion. No pneumothorax. No acute
osseous abnormality.
IMPRESSION: 1. Endotracheal tube remains at the level of the carina directed
towards the right mainstem bronchus. Recommend retraction.
2. Enteric tube tip remains in the distal esophagus. Recommend
advancement into the stomach.

## 2023-12-20 IMAGING — DX DG CHEST 1V PORT
1 series · 1 of 1 positions shown · non-contrast
Comparison: 01/06/2022

CLINICAL DATA: Postoperative CABG

EXAM:
PORTABLE CHEST 1 VIEW

[chest ap]
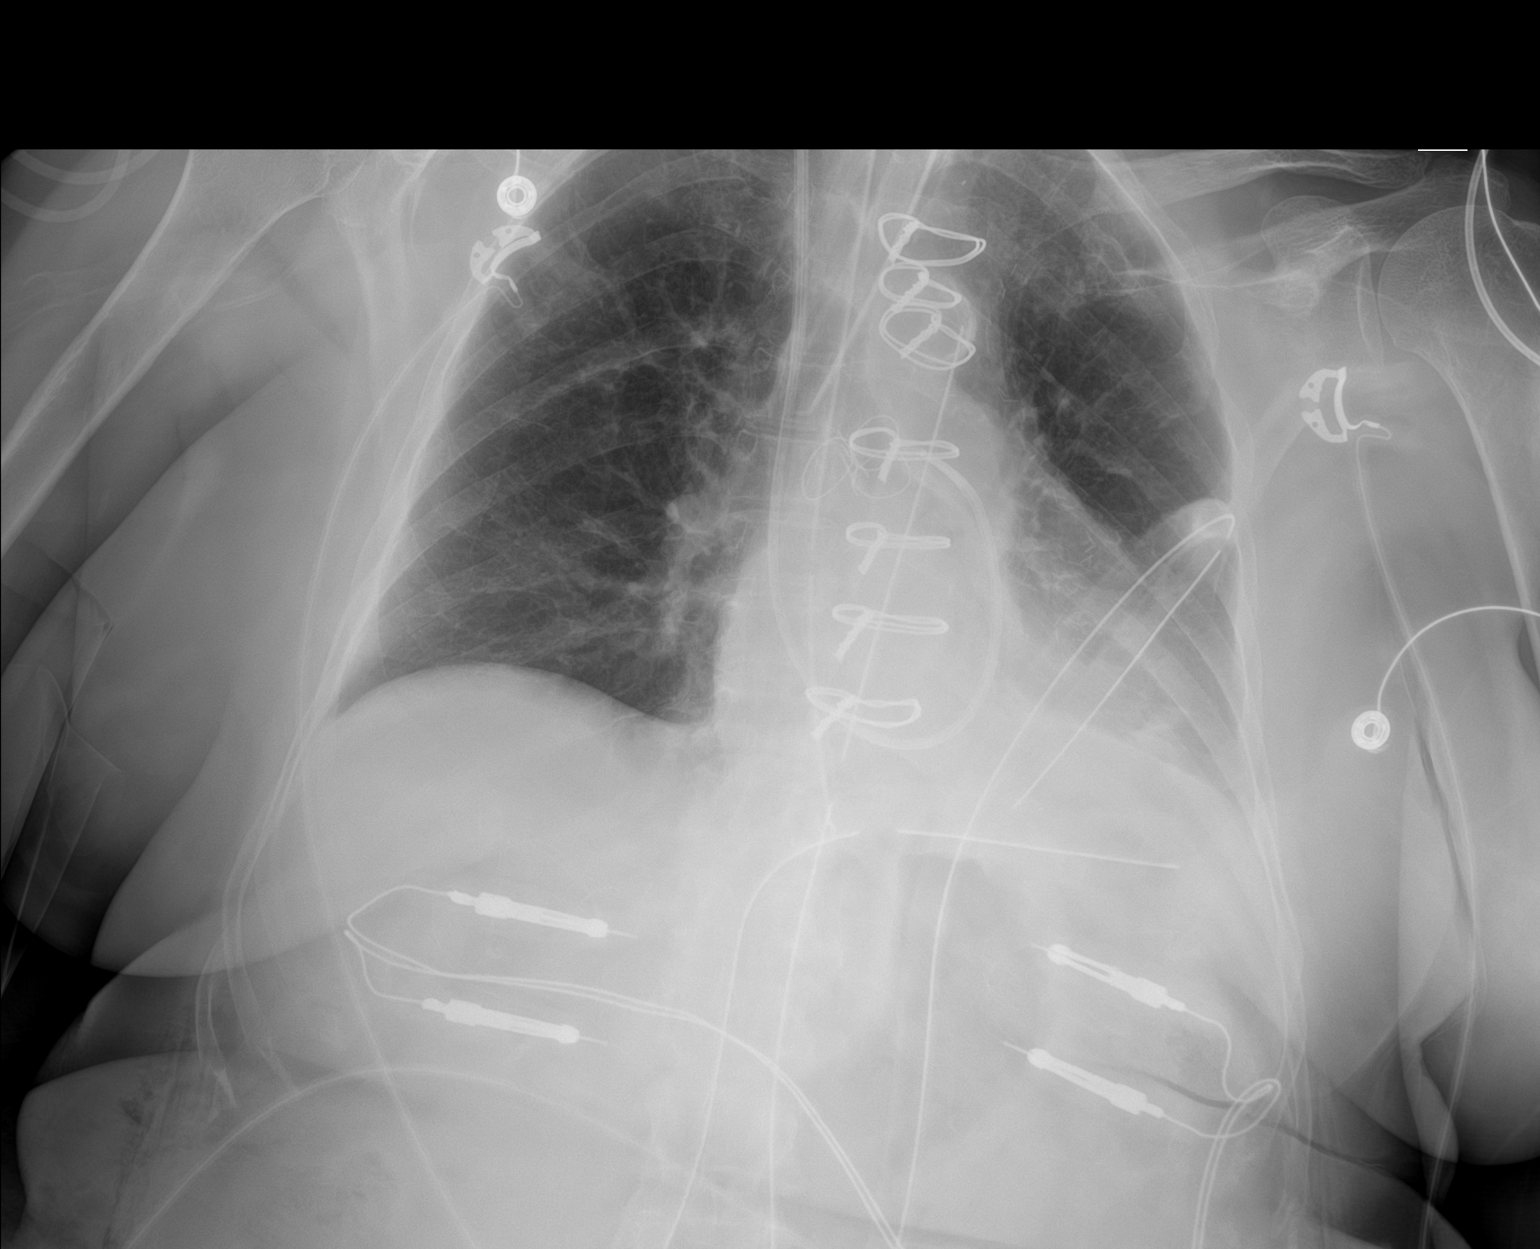

[1 of 1 positions shown; findings below may reference images not displayed]

FINDINGS: Interval postsurgical changes from CABG. ET tube is at the level of
the carina directed towards the right mainstem bronchus. Enteric
tube distal tip terminates near the level of the GE junction with
side port in the distal esophagus. Right IJ pulmonary arterial
catheter terminates in the proximal pulmonary outflow. Left chest
tubes and mediastinal drain in place.

Heart size is normal. Aortic atherosclerosis. Left basilar
atelectasis. No pneumothorax.
IMPRESSION: 1. ET tube at the level of the carina directed towards the right
mainstem bronchus. Recommend 3-4 cm of retraction.
2. Enteric tube distal tip terminates near the GE junction.
Recommend advancement approximately 10 cm.
3. Postoperative CABG.  No acute findings.

These results will be called to the ordering clinician or
representative by the Radiologist Assistant, and communication
documented in the PACS or [REDACTED].

## 2023-12-22 ENCOUNTER — Other Ambulatory Visit (HOSPITAL_COMMUNITY): Payer: Self-pay

## 2023-12-22 ENCOUNTER — Telehealth: Payer: Self-pay

## 2023-12-22 NOTE — Telephone Encounter (Signed)
 Pharmacy Patient Advocate Encounter   Received notification from Fax that prior authorization for Pregabalin 25MG  capsules is required/requested.   Insurance verification completed.   The patient is insured through [employer] .   Per test claim: PA required; PA submitted to above mentioned insurance via CoverMyMeds Key/confirmation #/EOC [id number] Status is pending

## 2023-12-22 NOTE — Telephone Encounter (Signed)
 Pharmacy Patient Advocate Encounter  Received notification from [employer] that Prior Authorization for Pregabalin 25MG  capsules has been APPROVED from 12/22/2023 to 09/08/2024. Unable to obtain price due to refill too soon rejection, last fill date 12/04/2023 next available fill date4/19/2025   PA #/Case ID/Reference #: PA Case ID #: [id number]

## 2023-12-25 ENCOUNTER — Encounter: Payer: Self-pay | Admitting: Diagnostic Neuroimaging

## 2023-12-25 DIAGNOSIS — G500 Trigeminal neuralgia: Secondary | ICD-10-CM

## 2023-12-29 NOTE — Telephone Encounter (Signed)
 Check phenytoin  level. Then we may adjust meds accordingly. -VRP

## 2023-12-31 ENCOUNTER — Other Ambulatory Visit (INDEPENDENT_AMBULATORY_CARE_PROVIDER_SITE_OTHER): Payer: Self-pay

## 2023-12-31 ENCOUNTER — Other Ambulatory Visit: Payer: Self-pay

## 2023-12-31 DIAGNOSIS — G500 Trigeminal neuralgia: Secondary | ICD-10-CM

## 2023-12-31 DIAGNOSIS — Z0289 Encounter for other administrative examinations: Secondary | ICD-10-CM

## 2024-01-01 LAB — PHENYTOIN LEVEL, FREE AND TOTAL
Phenytoin, Free: 0.9 ug/mL — ABNORMAL LOW (ref 1.0–2.0)
Phenytoin, Serum: 11.2 ug/mL (ref 10.0–20.0)

## 2024-01-05 ENCOUNTER — Telehealth: Payer: Self-pay | Admitting: Diagnostic Neuroimaging

## 2024-01-05 ENCOUNTER — Other Ambulatory Visit: Payer: Self-pay | Admitting: Diagnostic Neuroimaging

## 2024-01-05 ENCOUNTER — Ambulatory Visit (HOSPITAL_BASED_OUTPATIENT_CLINIC_OR_DEPARTMENT_OTHER)
Admission: RE | Admit: 2024-01-05 | Discharge: 2024-01-05 | Disposition: A | Discharge status: Home / Self Care | Source: Ambulatory Visit | Attending: Diagnostic Neuroimaging | Admitting: Diagnostic Neuroimaging

## 2024-01-05 ENCOUNTER — Encounter: Payer: Self-pay | Admitting: Diagnostic Neuroimaging

## 2024-01-05 DIAGNOSIS — R220 Localized swelling, mass and lump, head: Secondary | ICD-10-CM | POA: Insufficient documentation

## 2024-01-05 DIAGNOSIS — R221 Localized swelling, mass and lump, neck: Secondary | ICD-10-CM | POA: Diagnosis not present

## 2024-01-05 DIAGNOSIS — I6522 Occlusion and stenosis of left carotid artery: Secondary | ICD-10-CM

## 2024-01-05 LAB — POCT I-STAT CREATININE: Creatinine, Ser: 1.5 mg/dL — ABNORMAL HIGH (ref 0.44–1.00)

## 2024-01-05 MED ORDER — IOHEXOL 300 MG/ML  SOLN
75.0000 mL | Freq: Once | INTRAMUSCULAR | Status: AC | PRN
  Administered 2024-01-05: 60 mL via INTRAVENOUS

## 2024-01-05 NOTE — Telephone Encounter (Signed)
 Somehow she got scheduled at [facility] for tonight so I changed the location on her PA.

## 2024-01-05 NOTE — Telephone Encounter (Signed)
 Sent message to MD to review mychart/phone call

## 2024-01-05 NOTE — Telephone Encounter (Signed)
 Patient came into the lobby and wanted to be seen by a Dr. Salli Crawley. She is not willing to go see her primary because they referred her here and she thinks her side effects are from the Lyrica /Dilantin .She is adamant that the medication is making her have side effects, left side of the face is swollen, wobbly and uncontrollable shakes. Do you want her to stop the medication? She stated that she didn't have any side effects with tegratol. She is having severe trigeminal neuralgia flare ups since she started the medication. When this happens she has to sit can't speak or walk. Please call daughter Devra Fontana back to discuss medication management and lab results.Would prefer to discuss before going to primary for facial swelling. Was seen by dentist in March and that was also ruled out as a dental problem.

## 2024-01-05 NOTE — Telephone Encounter (Signed)
 Dr. Salli Crawley sent response via mychart. I sent mychart to pt.

## 2024-01-05 NOTE — Telephone Encounter (Signed)
  Will check additional testing. Ordered these stat. If not able to get done today, then may need to go to ER.   Orders Placed This Encounter  Procedures   CT SOFT TISSUE NECK W CONTRAST   CT MAXILLOFACIAL WO CONTRAST   Omega Bible, MD 01/05/2024, 11:24 AM Certified in Neurology, Neurophysiology and Neuroimaging  [facility] [address] [place], [place] [address] [phone]

## 2024-01-05 NOTE — Telephone Encounter (Signed)
 London Rivers: [id number] exp. 01/05/24-03/05/24 sent to GI [phone]

## 2024-01-06 ENCOUNTER — Encounter: Payer: Self-pay | Admitting: Diagnostic Neuroimaging

## 2024-01-06 ENCOUNTER — Other Ambulatory Visit: Payer: Self-pay

## 2024-01-06 MED ORDER — AMOXICILLIN 500 MG PO CAPS
500.0000 mg | ORAL_CAPSULE | Freq: Four times a day (QID) | ORAL | 0 refills | Status: DC
  Filled 2024-01-06: qty 30, 8d supply, fill #0

## 2024-01-06 NOTE — Addendum Note (Signed)
 Addended by: Gwendloyn Lemming R on: 01/06/2024 10:45 AM   Modules accepted: Orders

## 2024-01-07 NOTE — Telephone Encounter (Signed)
 I called patient. No answer. I called daughter Devra Fontana, and reviewed.  Patient feeling some side effects with combination of pregabalin  and Dilantin .  Pregabalin  by itself was not controlling trigeminal neuralgia symptoms.  Adding Dilantin  seem to help control the symptoms because some side effects of fogginess and dizziness.  Over the weekend patient stopped the Dilantin  and reduced the pregabalin .  Now trigeminal neuralgia pain is worse.  She is also seeing dentist and had a extraction this morning for her dental infection.  Patient still having some ongoing lightening like pain shock sensations.  Recommend to try Dilantin  by itself this time and wean off of pregabalin .  Omega Bible, MD 01/07/2024, 3:29 PM Certified in Neurology, Neurophysiology and Neuroimaging  [facility] [address] [place], [place] [address] [phone]

## 2024-01-11 ENCOUNTER — Encounter: Payer: Self-pay | Admitting: Diagnostic Neuroimaging

## 2024-01-12 ENCOUNTER — Telehealth: Payer: Self-pay | Admitting: Neurology

## 2024-01-12 NOTE — Telephone Encounter (Signed)
 Ok to stop dilantin . -VRP

## 2024-01-12 NOTE — Telephone Encounter (Signed)
 Per Dr Tresia Fruit, "Patient's daughter called. Dilantin  helpful but causing her upper and lower lips to swell. Unclear if this is due to the abscess or the medication but zyrtec helps with the lip swelling and then swelling worsens as the zyrtec wears off. Patient going to hold the 300mg  of dilantin  overnight and see how that goes but the dilantin  is really helping with the pain. Please call for an update and let Dr. Salli Crawley know so he can make decision on medication management thank you"  Will send this information to Dr Salli Crawley for him to review along with the pics posted.

## 2024-01-12 NOTE — Telephone Encounter (Signed)
 Dr Salli Crawley has reviewed the message from the patient and recommends that she stop dilantin .

## 2024-01-12 NOTE — Telephone Encounter (Signed)
 Patient's daughter called. Dilantin  helpful but causing her upper and lower lips to swell. Unclear if this is due to the abscess or the medication but zyrtec helps with the lip swelling and then swelling worsens as the zyrtec wears off. No other symptoms such as shortness of breath, rash, itching, or any compromise of breathing. Advised to monitor and call 911 for worsening. Patient going to hold the 300mg  of dilantin  overnight and see how that goes but the dilantin  is really helping with the pain. Please call for an update and let Dr. Salli Crawley know so he can make decision on medication management thank you

## 2024-01-14 ENCOUNTER — Encounter: Payer: Self-pay | Admitting: Family

## 2024-01-21 ENCOUNTER — Encounter: Payer: Self-pay | Admitting: Diagnostic Neuroimaging

## 2024-01-22 ENCOUNTER — Encounter: Payer: Self-pay | Admitting: Pharmacist

## 2024-01-22 NOTE — Progress Notes (Signed)
 Pharmacy Quality Measure Review  This patient is appearing on a report for being at risk of failing the adherence measure for cholesterol (statin) medications this calendar year.   Medication: ATORVASTATIN  80MG  Last fill date: 09/22/23 for 30 day supply  Refill Due: 10/22/23  3 refills remaining on current prescription. Has not been refilled. Last filled at [facility] pharmacy.

## 2024-02-04 ENCOUNTER — Encounter: Payer: Self-pay | Admitting: Family

## 2024-02-04 DIAGNOSIS — G500 Trigeminal neuralgia: Secondary | ICD-10-CM

## 2024-02-09 ENCOUNTER — Other Ambulatory Visit: Payer: Self-pay

## 2024-02-09 ENCOUNTER — Encounter: Payer: Self-pay | Admitting: Diagnostic Neuroimaging

## 2024-02-09 ENCOUNTER — Telehealth: Admitting: Diagnostic Neuroimaging

## 2024-02-09 DIAGNOSIS — R519 Headache, unspecified: Secondary | ICD-10-CM

## 2024-02-09 DIAGNOSIS — R42 Dizziness and giddiness: Secondary | ICD-10-CM | POA: Diagnosis not present

## 2024-02-09 MED ORDER — CARBAMAZEPINE 100 MG PO CHEW
100.0000 mg | CHEWABLE_TABLET | Freq: Two times a day (BID) | ORAL | 3 refills | Status: DC
  Filled 2024-02-09: qty 60, 30d supply, fill #0

## 2024-02-09 NOTE — Progress Notes (Signed)
 [facility]  PATIENT: Katherine Brooks DOB: 1936/09/03  REFERRING CLINICIAN: Felicita Horns, FNP HISTORY FROM: patient REASON FOR VISIT: follow up   HISTORICAL  CHIEF COMPLAINT:  Chief Complaint  Patient presents with   left facial pain    HISTORY OF PRESENT ILLNESS:   UPDATE (02/09/24, VRP): Since last visit, tried phenytoin , but cause lip swelling and facial rash, so stopped it. Also, had left facial and throat swelling, and CT scans showed left mandibular infx / abscess, saw dentist and had tooth extraction and antibiotics. Swelling is now better. Continues with left V1, V2, V3 pain, sharp and shooting and intermittent.   UPDATE (12/09/23, VRP): Since last visit, doing well after last flare up which got better by 2022. CBZ caused some rash initially, but later tolerated, then tried gabapentin  and did well. Then 1 month ago had flare up of left maxillary pain returned. Had some old gabapentin , but did not help. Now on pregabalin  25mg  twice a day and this is helping a little.  PRIOR HPI (07/10/20, VRP): 88 year old female here for evaluation of trigeminal neuralgia.  March 27, 2020 patient had onset of left maxillary sharp electrical pain sensations.  Symptoms aggravated by touching or brushing her teeth.  Pain was quite severe.  No other associated symptoms.  No triggering aggravating factors otherwise noted.  Patient was tried on gabapentin  which did not help.  She was changed to carbamazepine  which significantly improved symptoms.  Now patient on 200 mg twice a day which is have resolved symptoms.  However patient does have some fatigue and jittery sensations.  Patient has been on this dose for the past 11 days.    REVIEW OF SYSTEMS: Full 14 system review of systems performed and negative with exception of: As per HPI.  ALLERGIES: Allergies  Allergen Reactions   Phenytoin  Rash   Lisinopril Other (See Comments)    "passed out" Other reaction(s): Other (See  Comments), Other (See Comments) "passed out" "I passed out"   Carbamazepine  Rash    Had rash one time in 2021; later tolerated without rash    HOME MEDICATIONS: Outpatient Medications Prior to Visit  Medication Sig Dispense Refill   amoxicillin  (AMOXIL ) 500 MG capsule Take 1 capsule (500 mg total) by mouth every 6 (six) hours until gone. 30 capsule 0   aspirin  EC 81 MG tablet Take 81 mg by mouth daily. Swallow whole.     atorvastatin  (LIPITOR ) 80 MG tablet TAKE 1 TABLET EVERY DAY 90 tablet 3   carvedilol  (COREG ) 3.125 MG tablet Take 1 tablet (3.125 mg total) by mouth 2 (two) times daily. PATIENT MUST MAKE AN APPOINTMENT IN ORDER TO RECEIVE FUTURE REFILLS. FIRST ATTEMPT. 60 tablet 0   Cholecalciferol (VITAMIN D3) 50 MCG (2000 UT) TABS Take 1 each by mouth daily.     Cyanocobalamin  (B-12) 5000 MCG CAPS Take 1 Dose by mouth daily.     ezetimibe  (ZETIA ) 10 MG tablet Take 1 tablet (10 mg total) by mouth daily. PT. MUST MAKE AN APPOINTMENT IN ORDER TO RECEIVE FUTURE REFILLS. FIRST ATTEMPT. 30 tablet 0   ferrous sulfate 325 (65 FE) MG tablet Take 325 mg by mouth daily with breakfast.     furosemide  (LASIX ) 20 MG tablet TAKE 1 TABLET EVERY DAY AS NEEDED FOR EDEMA 90 tablet 3   MAGNESIUM  PO Take 400 mg by mouth daily.     Multiple Vitamins-Minerals (WOMENS 50+ MULTI VITAMIN PO) Take by mouth daily.     pregabalin  (LYRICA ) 75 MG  capsule Take 1 capsule (75 mg total) by mouth 2 (two) times daily. 60 capsule 5   Facility-Administered Medications Prior to Visit  Medication Dose Route Frequency Provider Last Rate Last Admin   [START ON 04/13/2024] denosumab  (PROLIA ) injection 60 mg  60 mg Subcutaneous Once Dugal, Tabitha, FNP        PAST MEDICAL HISTORY: Past Medical History:  Diagnosis Date   Allergy    CAD (coronary artery disease)    a. 12/2021 MV: mid-dist ant, antsept ischemia; b. 12/2021 Cath: LM 60ost, 90d, LAD 90ost, 95p, 14m, D2 30, LCX 90p, RCA 90p; c. 12/2021 CABG x 4: LIMA->LAD, VG->OM,  VG->Diag, VG->RPDA.   Cancer (HCC)    skin   Chronic HFrEF (heart failure with reduced ejection fraction) (HCC)    CKD (chronic kidney disease), stage III (HCC)    COVID-19 virus infection 09/2021   Facial pain    Hyperlipidemia LDL goal <70    Hypertension    Ischemic cardiomyopathy    a. 11/2021 Echo: EF 30-35%, glob HK, GrI DD, nl RV fxn, mildly dil LA, triv MR.   LBBB (left bundle branch block)     PAST SURGICAL HISTORY: Past Surgical History:  Procedure Laterality Date   CATARACT EXTRACTION Bilateral 2025   CORONARY ARTERY BYPASS GRAFT N/A 01/07/2022   Procedure: CORONARY ARTERY BYPASS GRAFTING (CABG) X FOUR BYPASSES USING OPEN LEFT INTERNAL MAMMARY ARTERY AND  ENDOSCOPIC RIGHT AND LEFT GREATER SAPHENOUS VEIN HARVEST.;  Surgeon: Bartley Lightning, MD;  Location: [facility] OR;  Service: Open Heart Surgery;  Laterality: N/A;   LEFT HEART CATH AND CORONARY ANGIOGRAPHY Left 01/01/2022   Procedure: LEFT HEART CATH AND CORONARY ANGIOGRAPHY;  Surgeon: Sammy Crisp, MD;  Location: [facility] INVASIVE CV LAB;  Service: Cardiovascular;  Laterality: Left;   salivary gland N/A 12/08/2012   TEE WITHOUT CARDIOVERSION N/A 01/07/2022   Procedure: TRANSESOPHAGEAL ECHOCARDIOGRAM (TEE);  Surgeon: Bartley Lightning, MD;  Location: [facility] OR;  Service: Open Heart Surgery;  Laterality: N/A;    FAMILY HISTORY: Family History  Problem Relation Age of Onset   Heart disease Mother    Lung disease Father    Cancer Sister        unknown primary   Rheum arthritis Sister    Kidney Stones Sister    COPD Brother     SOCIAL HISTORY: Social History   Socioeconomic History   Marital status: Married    Spouse name: Adaline Holly   Number of children: 3   Years of education: Not on file   Highest education level: Associate degree: occupational, Scientist, product/process development, or vocational program  Occupational History    Comment: Diplomatic Services operational officer, school system    Employer: retired  Tobacco Use   Smoking status: Never    Passive exposure: Never    Smokeless tobacco: Never  Vaping Use   Vaping status: Never Used  Substance and Sexual Activity   Alcohol  use: No   Drug use: No   Sexual activity: Not Currently  Other Topics Concern   Not on file  Social History Narrative   Lives with husband   Social Drivers of Health   Financial Resource Strain: Low Risk  (05/04/2023)   Overall Financial Resource Strain (CARDIA)    Difficulty of Paying Living Expenses: Not hard at all  Food Insecurity: No Food Insecurity (05/04/2023)   Hunger Vital Sign    Worried About Running Out of Food in the Last Year: Never true    Ran Out of Food in the Last Year:  Never true  Transportation Needs: No Transportation Needs (05/04/2023)   PRAPARE - Administrator, Civil Service (Medical): No    Lack of Transportation (Non-Medical): No  Physical Activity: Sufficiently Active (05/04/2023)   Exercise Vital Sign    Days of Exercise per Week: 5 days    Minutes of Exercise per Session: 90 min  Recent Concern: Physical Activity - Inactive (04/22/2023)   Exercise Vital Sign    Days of Exercise per Week: 0 days    Minutes of Exercise per Session: 0 min  Stress: No Stress Concern Present (05/04/2023)   [employer] of Occupational Health - Occupational Stress Questionnaire    Feeling of Stress : Not at all  Social Connections: Moderately Integrated (05/04/2023)   Social Connection and Isolation Panel [NHANES]    Frequency of Communication with Friends and Family: More than three times a week    Frequency of Social Gatherings with Friends and Family: Three times a week    Attends Religious Services: Never    Active Member of Clubs or Organizations: No    Attends Engineer, structural: More than 4 times per year    Marital Status: Married  Catering manager Violence: Not At Risk (04/23/2023)   Humiliation, Afraid, Rape, and Kick questionnaire    Fear of Current or Ex-Partner: No    Emotionally Abused: No    Physically Abused: No     Sexually Abused: No     PHYSICAL EXAM  GENERAL EXAM/CONSTITUTIONAL: Vitals:  There were no vitals filed for this visit.  There is no height or weight on file to calculate BMI. Wt Readings from Last 3 Encounters:  12/09/23 184 lb (83.5 kg)  12/04/23 184 lb 12.8 oz (83.8 kg)  08/22/23 182 lb 6.4 oz (82.7 kg)   Patient is in no distress; well developed, nourished and groomed; neck is supple  CARDIOVASCULAR: Examination of carotid arteries is normal; no carotid bruits Regular rate and rhythm, no murmurs Examination of peripheral vascular system by observation and palpation is normal  EYES: Ophthalmoscopic exam of optic discs and posterior segments is normal; no papilledema or hemorrhages No results found.  MUSCULOSKELETAL: Gait, strength, tone, movements noted in Neurologic exam below  NEUROLOGIC: MENTAL STATUS:     11/08/2020    1:13 PM 07/05/2019   11:21 AM  MMSE - Mini Mental State Exam  Not completed: Refused   Orientation to time  5  Orientation to Place  5  Registration  3  Attention/ Calculation  5  Recall  3  Language- repeat  1   awake, alert, oriented to person, place and time recent and remote memory intact normal attention and concentration language fluent, comprehension intact, naming intact fund of knowledge appropriate  CRANIAL NERVE:  2nd - no papilledema on fundoscopic exam 2nd, 3rd, 4th, 6th - pupils equal and reactive to light, visual fields full to confrontation, extraocular muscles intact, no nystagmus 5th - facial sensation symmetric 7th - facial strength symmetric 8th - hearing intact 9th - palate elevates symmetrically, uvula midline 11th - shoulder shrug symmetric 12th - tongue protrusion midline  MOTOR:  normal bulk and tone, full strength in the BUE, BLE  SENSORY:  normal and symmetric to light touch, temperature, vibration  COORDINATION:  finger-nose-finger, fine finger movements normal  REFLEXES:  deep tendon reflexes TRACE  and symmetric  GAIT/STATION:  narrow based gait     DIAGNOSTIC DATA (LABS, IMAGING, TESTING) - I reviewed patient records, labs, notes, testing and imaging  myself where available.  Lab Results  Component Value Date   WBC 7.2 08/06/2023   HGB 12.2 08/06/2023   HCT 38.7 08/06/2023   MCV 90 08/06/2023   PLT 252 08/06/2023      Component Value Date/Time   NA 137 10/09/2023 1131   NA 138 08/06/2023 1119   K 4.6 10/09/2023 1131   CL 105 10/09/2023 1131   CO2 26 10/09/2023 1131   GLUCOSE 92 10/09/2023 1131   BUN 22 10/09/2023 1131   BUN 27 08/06/2023 1119   CREATININE 1.50 (H) 01/05/2024 1729   CREATININE 1.29 (H) 03/01/2016 0924   CALCIUM  9.2 10/09/2023 1131   PROT 7.1 08/06/2023 1119   ALBUMIN  4.1 08/06/2023 1119   AST 21 08/06/2023 1119   ALT 12 08/06/2023 1119   ALKPHOS 120 08/06/2023 1119   BILITOT 0.3 08/06/2023 1119   GFRNONAA 27 (L) 03/26/2023 0956   GFRNONAA 40 (L) 03/01/2016 0924   GFRAA 42 (L) 03/17/2018 0416   GFRAA 46 (L) 03/01/2016 0924   Lab Results  Component Value Date   CHOL 123 03/27/2023   HDL 58 03/27/2023   LDLCALC 53 03/27/2023   TRIG 55 03/27/2023   CHOLHDL 2.4 08/30/2022   Lab Results  Component Value Date   HGBA1C 5.5 01/06/2022   Lab Results  Component Value Date   VITAMINB12 446 05/08/2023   Lab Results  Component Value Date   TSH 3.19 11/05/2021    03/30/20 MRI brain (with and without) [I reviewed images myself and agree with interpretation. -VRP]  - No mass or abnormal enhancement. The left superior cerebellar artery may have mass effect on the left trigeminal nerve root entry zone.   01/05/24 CT soft tissue 1. Dental disease and left lower facial soft tissue swelling/cellulitis as detailed on the contemporaneous maxillofacial CT. 2. No acute finding elsewhere in the neck. 3. Carotid atherosclerosis with potentially severe stenosis of the proximal left ICA. Consider nonemergent carotid ultrasound for further  evaluation.  01/05/24 CT maxillofacial 1. Periapical abscess surrounding the root of tooth # 20 with transcortical extension into the overlying buccal soft tissues. 2. There is a 3 x 7 mm loculated fluid collection abutting the mandibular body adjacent to the area of cortical erosion in the left mandibular body in keeping with a small subperiosteal abscess. 3. Extensive surrounding soft tissue swelling is seen involving the overlying soft tissues of the left buccal region extending inferiorly to the level of the hyoid.    ASSESSMENT AND PLAN  88 y.o. year old female here with:  Meds tried: CBZ (rash), gabapentin , pregabalin   Dx:  1. Left facial pain   2. Dizziness     PLAN:  LEFT V1, V2, V3 trigeminal neuralgia (possible microvascular compression; also with new left mandibular abscess around #20 tooth, now s/p extraction and antibiotics, but pain continues) - continue pregabalin  75mg  twice a day - start carbamazepine  100mg  twice a day (remote history of rash in 2021, but later on tolerated it better) -If symptoms significant worsen or become intractable, then could consider gamma knife for microvascular decompression referral  LEFT MANDIBULAR AND PERIAPICAL INFX / AABSCESS (#20 tooth) - per dentist consider oral surgeon follow up  LEFT ICA stenosis (found incidentally on CT scan) - follow up carotid u/s (already ordered)  Meds ordered this encounter  Medications   carbamazepine  (TEGRETOL ) 100 MG chewable tablet    Sig: Chew 1 tablet (100 mg total) by mouth 2 (two) times daily.    Dispense:  60  tablet    Refill:  3   Meds ordered this encounter  Medications   carbamazepine  (TEGRETOL ) 100 MG chewable tablet    Sig: Chew 1 tablet (100 mg total) by mouth 2 (two) times daily.    Dispense:  60 tablet    Refill:  3    Return in about 3 months (around 05/11/2024) for MyChart visit (15 min).  Virtual Visit via Video Note  I connected with TAYJAH LOBDELL on 02/09/24 at   3:00 PM EDT by a video enabled telemedicine application and verified that I am speaking with the correct person using two identifiers.   I discussed the limitations of evaluation and management by telemedicine and the availability of in person appointments. The patient expressed understanding and agreed to proceed.  Patient is at home and I am at the office.   I spent 25 minutes of face-to-face and non-face-to-face time with patient.  This included previsit chart review, lab review, study review, order entry, electronic health record documentation, patient education.      Omega Bible, MD 02/09/2024, 3:33 PM Certified in Neurology, Neurophysiology and Neuroimaging  [facility] [address], [address] [place], [place] [address] [phone]

## 2024-02-09 NOTE — Patient Instructions (Signed)
 LEFT V1, V2, V3 trigeminal neuralgia (possible microvascular compression; also with new left mandibular abscess around #20 tooth, now s/p extraction and antibiotics, but pain continues) - continue pregabalin  75mg  twice a day - start carbamazepine  100mg  twice a day (remote history of rash in 2021, but later on tolerated it better) -If symptoms significant worsen or become intractable, then could consider gamma knife for microvascular decompression referral  LEFT MANDIBULAR AND PERIAPICAL INFX / AABSCESS (#20 tooth) - per dentist consider oral surgeon follow up  LEFT INTERNAL CAROTID ARTERY stenosis (found incidentally on CT scan) - follow up carotid u/s

## 2024-02-11 ENCOUNTER — Telehealth: Payer: Self-pay | Admitting: Diagnostic Neuroimaging

## 2024-02-11 NOTE — Telephone Encounter (Signed)
 [employer] Tracking #[id number] exp. 02/11/2024 - 04/11/2024 sent to GI [phone]

## 2024-02-17 ENCOUNTER — Encounter: Payer: Self-pay | Admitting: Diagnostic Neuroimaging

## 2024-02-18 ENCOUNTER — Other Ambulatory Visit: Payer: Self-pay | Admitting: Neurology

## 2024-02-18 MED ORDER — CARBAMAZEPINE 100 MG PO CHEW: 100.0000 mg | CHEWABLE_TABLET | Freq: Two times a day (BID) | ORAL | 1 refills | Status: DC

## 2024-02-24 ENCOUNTER — Ambulatory Visit (HOSPITAL_COMMUNITY)

## 2024-03-01 ENCOUNTER — Other Ambulatory Visit: Payer: Self-pay

## 2024-03-01 MED ORDER — AMOXICILLIN 500 MG PO CAPS
500.0000 mg | ORAL_CAPSULE | Freq: Four times a day (QID) | ORAL | 0 refills | Status: DC
  Filled 2024-03-01: qty 30, 8d supply, fill #0

## 2024-03-03 DIAGNOSIS — R600 Localized edema: Secondary | ICD-10-CM | POA: Diagnosis not present

## 2024-03-03 DIAGNOSIS — I129 Hypertensive chronic kidney disease with stage 1 through stage 4 chronic kidney disease, or unspecified chronic kidney disease: Secondary | ICD-10-CM | POA: Diagnosis not present

## 2024-03-03 DIAGNOSIS — E875 Hyperkalemia: Secondary | ICD-10-CM | POA: Diagnosis not present

## 2024-03-03 DIAGNOSIS — D631 Anemia in chronic kidney disease: Secondary | ICD-10-CM | POA: Diagnosis not present

## 2024-03-03 DIAGNOSIS — I10 Essential (primary) hypertension: Secondary | ICD-10-CM | POA: Diagnosis not present

## 2024-03-03 DIAGNOSIS — N1832 Chronic kidney disease, stage 3b: Secondary | ICD-10-CM | POA: Diagnosis not present

## 2024-03-08 ENCOUNTER — Encounter: Payer: Self-pay | Admitting: Cardiology

## 2024-03-08 ENCOUNTER — Ambulatory Visit: Attending: Cardiology | Admitting: Cardiology

## 2024-03-08 VITALS — BP 110/60 | HR 82 | Ht 60.0 in | Wt 164.0 lb

## 2024-03-08 DIAGNOSIS — N1832 Chronic kidney disease, stage 3b: Secondary | ICD-10-CM | POA: Diagnosis not present

## 2024-03-08 DIAGNOSIS — I5022 Chronic systolic (congestive) heart failure: Secondary | ICD-10-CM

## 2024-03-08 DIAGNOSIS — I10 Essential (primary) hypertension: Secondary | ICD-10-CM | POA: Diagnosis not present

## 2024-03-08 DIAGNOSIS — I255 Ischemic cardiomyopathy: Secondary | ICD-10-CM

## 2024-03-08 DIAGNOSIS — I447 Left bundle-branch block, unspecified: Secondary | ICD-10-CM | POA: Diagnosis not present

## 2024-03-08 DIAGNOSIS — I2510 Atherosclerotic heart disease of native coronary artery without angina pectoris: Secondary | ICD-10-CM | POA: Diagnosis not present

## 2024-03-08 DIAGNOSIS — E785 Hyperlipidemia, unspecified: Secondary | ICD-10-CM | POA: Diagnosis not present

## 2024-03-08 DIAGNOSIS — Z951 Presence of aortocoronary bypass graft: Secondary | ICD-10-CM

## 2024-03-08 DIAGNOSIS — N183 Chronic kidney disease, stage 3 unspecified: Secondary | ICD-10-CM

## 2024-03-08 DIAGNOSIS — I129 Hypertensive chronic kidney disease with stage 1 through stage 4 chronic kidney disease, or unspecified chronic kidney disease: Secondary | ICD-10-CM | POA: Diagnosis not present

## 2024-03-08 DIAGNOSIS — R600 Localized edema: Secondary | ICD-10-CM | POA: Diagnosis not present

## 2024-03-08 NOTE — Patient Instructions (Signed)
 Medication Instructions:  Your physician recommends that you continue on your current medications as directed. Please refer to the Current Medication list given to you today.   *If you need a refill on your cardiac medications before your next appointment, please call your pharmacy*  Lab Work: No labs ordered today  If you have labs (blood work) drawn today and your tests are completely normal, you will receive your results only by: MyChart Message (if you have MyChart) OR A paper copy in the mail If you have any lab test that is abnormal or we need to change your treatment, we will call you to review the results.  Testing/Procedures: No test ordered today   Follow-Up: At [facility], you and your health needs are our priority.  As part of our continuing mission to provide you with exceptional heart care, our providers are all part of one team.  This team includes your primary Cardiologist (physician) and Advanced Practice Providers or APPs (Physician Assistants and Nurse Practitioners) who all work together to provide you with the care you need, when you need it.  Your next appointment:   12 month(s)  Provider:   Timothy Gollan, MD

## 2024-03-08 NOTE — Progress Notes (Signed)
 Cardiology Office Note   Date:  03/08/2024  ID:  Katherine Brooks, DOB 04-30-1936, MRN [id number] PCP: Corwin Antu, FNP  [facility] Providers Cardiologist:  Evalene Lunger, MD     History of Present Illness Katherine Brooks is a 88 y.o. female with past medical history of coronary disease, ischemic cardiomyopathy/HFrEF,hypertension, stage III chronic kidney disease, chronic left bundle branch block, who presents today for follow-up of her coronary artery disease and HFrEF.   She establish care with Dr. Gollan in February 2023 with persistent dyspnea on exertion following a COVID-19 infection in early 2023.  Echocardiogram suggested an EF of 30-35% with global hypokinesia, G1 DD, normal RV function, trivial MR.  She underwent stress testing in April 2023 which showed a large region of mid to distal anterior and anteroseptal ischemia.  Subsequent diagnostic catheterization 12/22/2021 showed severe multivessel CAD including 90% distal left main/ostial LAD, 90% proximal left circumflex and RCA stenosis.  She was transferred to [facility] and underwent CABG x 4 on 01/07/2022.  She had LIMA to the LAD, SVG to the OM, SVG to diagonal, and SVG to the RPDA. She required 1 unit of PRBCs and guideline directed medical therapy for HFrEF/ischemic cardiomyopathy was deferred at discharge home follow-up 01/15/22 in the setting of persistent hypotension.  She was seen in clinic 03/06/2022 was stable as blood pressures.  Beta-blocker was added back at that time.  Kidney function was assessed in the setting of decreased function Entresto  was deferred.  She was seen in clinic by Dr. Gollan in 06/2022.  Started on losartan  25 mg daily.  Since last being seen she states she had been feeling well.  Follow-up labs after starting losartan  revealed stable renal function with mild hyperkalemia with potassium of 5.2.  She was advised to avoid potassium rich foods and the plan was for follow-up labs again in 2 weeks.   She was tolerating the losartan  and reported lightheadedness and dizziness.  Blood pressures had been stable unfortunately potassium continued to remain elevated and ARB therapy would need to be discontinued.  At her follow-up appointment 07/17/2022 she was doing well on her current medications.  She was scheduled for an updated echocardiogram to reassess LVEF and determine potential need for EP evaluation.   She was last seen in clinic 12/02/2022 by Dr. Lunger of the cardiac perspective was doing well.  She continued to follow with Dr. Lazarus from nephrology.  After her CABG LVEF improved to 50-55%.  She been continued on losartan , carvedilol , and furosemide  on a as needed basis.  No other medication changes that were made and further testing that was ordered at that time.  She returns to clinic today stating that she has been doing extremely well from a cardiac perspective.  Continues to deny chest pain, shortness of breath, or peripheral edema.  She stated that nephrology had taken her off of the losartan .  She is spent the past 4 months battling trigeminal neuralgia and had a rash history related to Dilantin  therapy and had to be placed on Tegretol .  She states since that time everything has been improving.  She recently followed up with nephrology and was advised that her kidney function had remained stable.  States that she has been compliant with her current medication regimen with any undue side effects.  Denies any hospitalizations or visits to the emergency department.  ROS: 10 point review of systems has been reviewed and considered negative except what is listed in the HPI  Studies Reviewed EKG Interpretation Date/Time:  Monday March 08 2024 13:31:19 EDT Ventricular Rate:  82 PR Interval:  148 QRS Duration:  118 QT Interval:  396 QTC Calculation: 462 R Axis:   -5  Text Interpretation: Normal sinus rhythm Low voltage QRS Left bundle branch block (chronic) ST & T wave abnormality, consider  inferolateral ischemia When compared with ECG of 26-Mar-2023 09:48, PREVIOUS ECG IS PRESENT No significant change since last tracing Confirmed by Gerard Frederick ([id number]) on 03/08/2024 1:41:55 PM    2d echo 08/23/2022  1. Left ventricular ejection fraction, by estimation, is 50 to 55%. The  left ventricle has low normal function. Septal motion consistent with  post-operative state/bundle branch block. Left ventricular diastolic  parameters are consistent with Grade I  diastolic dysfunction (impaired relaxation). The average left ventricular  global longitudinal strain is -18.7 %.   2. Right ventricular systolic function is normal. The right ventricular  size is normal. There is mildly elevated pulmonary artery systolic  pressure. The estimated right ventricular systolic pressure is 36.6 mmHg.   3. The mitral valve is normal in structure. Mild mitral valve  regurgitation. No evidence of mitral stenosis.   4. The aortic valve is normal in structure. Aortic valve regurgitation is  not visualized. Aortic valve sclerosis is present, with no evidence of  aortic valve stenosis.   5. The inferior vena cava is normal in size with greater than 50%  respiratory variability, suggesting right atrial pressure of 3 mmHg.   [facility] 01/01/2022 Conclusions: Severe multivessel coronary artery disease, including 60% ostial and 90% distal LMCA stenoses extending into the ostium of the LAD, sequential 90% proximal and 60% mid LAD lesion, and 90% proximal LCx and RCA stenoses. Upper normal left ventricular filling pressure (LVEDP 15 mmHg).   Recommendations: Transfer to [facility] for inpatient cardiac surgery consultation, given severity of coronary artery disease and reduced LVEF. Aggressive secondary prevention of coronary artery disease. Optimize goal-directed medical therapy in the setting of chronic HFrEF due to ischemic cardiomyopathy. Gentle post-catheterization hydration, given chronic kidney  disease.  Lexiscan  MPI 12/28/2021 Pharmacological myocardial perfusion imaging study with large region of ischemia in the mid to distal anterior and anteroseptal wall Septal wall motion abnormality consistent with bundle branch block, EF estimated at 65% No EKG changes concerning for ischemia at peak stress or in recovery. Resting EKG with left bundle branch block CT attenuation correction images with three-vessel coronary calcification, notably in the LAD, moderate aortic atherosclerosis in the arch High risk scan  2d echo 11/07/2021  1. Left ventricular ejection fraction, by estimation, is 30 to 35%. The  left ventricle has moderate to severely decreased function. The left  ventricle demonstrates global hypokinesis. Left ventricular diastolic  parameters are consistent with Grade I  diastolic dysfunction (impaired relaxation).   2. Right ventricular systolic function is normal. The right ventricular  size is normal.   3. Left atrial size was mildly dilated.   4. The mitral valve is normal in structure. Trivial mitral valve  regurgitation.   5. The aortic valve is tricuspid. Aortic valve regurgitation is not  visualized.  Risk Assessment/Calculations         Physical Exam VS:  BP 110/60 (BP Location: Left Arm, Patient Position: Sitting, Cuff Size: Large)   Pulse 82   Ht 5' (1.524 m)   Wt 164 lb (74.4 kg)   SpO2 97%   BMI 32.03 kg/m        Wt Readings from Last 3 Encounters:  03/08/24 164 lb (74.4 kg)  12/09/23 184 lb (83.5 kg)  12/04/23 184 lb 12.8 oz (83.8 kg)    GEN: Well nourished, well developed in no acute distress NECK: No JVD; No carotid bruits CARDIAC: RRR, no murmurs, rubs, gallops RESPIRATORY:  Clear to auscultation without rales, wheezing or rhonchi  ABDOMEN: Soft, non-tender, non-distended EXTREMITIES: Trace pretibial edema; No deformity   ASSESSMENT AND PLAN Coronary artery disease status post CABG times 45/1/23.  She continues to do well without any signs  of decompensation.  She denies any anginal or anginal equivalents.  EKG today reveals sinus rhythm with rate of 82, left bundle branch block that is chronic, ST and T wave changes that are nonspecific with no acute changes from prior studies.  She is continued on aspirin  81 mg daily, atorvastatin  80 mg daily, ezetimibe  10 mg daily.  No further ischemic testing needed at this time.  Ischemic cardiomyopathy/chronic HFimpEF with last LVEF 50-55% with great improvement status post CABG.  GDMT was limited at the time due to postoperative hypotension.  She is continued on furosemide  20 mg as needed, propranolol 3.25 mg twice daily.  Losartan  was discontinued nephrology.  SGLT2 inhibitors and MRA therapy deferred to due to elevated kidney function.  She appears to be euvolemic on exam.  Suffers from NYHA class I-II symptoms.  Primary hypertension with a blood pressure today 110/60.  Blood pressures remain stable.  She has continued on carvedilol  3.25 mg twice daily and low-dose furosemide  20 mg daily.  She has been encouraged to continue to monitor her blood pressures 1 to 2 hours postmedication administration at home as well.  Mixed hyperlipidemia with an LDL of 53.  Continues to remain at goal.  She is continued on atorvastatin  80 mg daily and ezetimibe  10 mg daily.  Stage III chronic kidney disease over the last serum creatinine of 1.45.  She continues to be followed by nephrology.  Left bundle branch block that has been chronic since roughly 2014.  No changes are noted to EKG.  Will continue to monitor with surveillance studies.     Dispo: Patient to return to clinic to see MD/APP in 11 to 12 months or sooner if needed for further evaluation.  Signed, Kaya Klausing, NP

## 2024-03-15 ENCOUNTER — Telehealth: Payer: Self-pay

## 2024-03-15 NOTE — Telephone Encounter (Signed)
 Prolia  VOB initiated via [email or web address]  Next Prolia  inj DUE: 04/13/24

## 2024-03-16 ENCOUNTER — Other Ambulatory Visit (HOSPITAL_COMMUNITY): Payer: Self-pay

## 2024-03-16 NOTE — Telephone Encounter (Signed)
 Pt ready for scheduling for PROLIA  on or after : 04/13/24  Option# 1: Buy/Bill (Office supplied medication)  Out-of-pocket cost due at time of clinic visit: $357  Number of injection/visits approved: 2  Primary: [employer] Prolia  co-insurance: 20% Admin fee co-insurance: 20%  Secondary: --- Prolia  co-insurance:  Admin fee co-insurance:   Medical Benefit Details: Date Benefits were checked: 03/16/24 Deductible: NO/ Coinsurance: 20%/ Admin Fee: 20%  Prior Auth: APPROVED PA# [id number] Expiration Date: 09/12/23-09/08/24   # of doses approved: 2 ----------------------------------------------------------------------- Option# 2- Med Obtained from pharmacy:  Pharmacy benefit: Copay $64 (Paid to pharmacy) Admin Fee: 20% (Pay at clinic)  Prior Auth: N/A PA# Expiration Date:   # of doses approved:   If patient wants fill through the pharmacy benefit please send prescription to: [employer], and include estimated need by date in rx notes. Pharmacy will ship medication directly to the office.  Patient NOT eligible for Prolia  Copay Card. Copay Card can make patient's cost as little as $25. Link to apply: [email or web address]://[email or web address]  ** This summary of benefits is an estimation of the patient's out-of-pocket cost. Exact cost may very based on individual plan coverage.

## 2024-03-22 ENCOUNTER — Telehealth: Admitting: Physician Assistant

## 2024-03-22 DIAGNOSIS — S41109A Unspecified open wound of unspecified upper arm, initial encounter: Secondary | ICD-10-CM

## 2024-03-22 NOTE — Progress Notes (Signed)
  Because I think this wound needs to be possible cleaned and examined better, I feel your condition warrants further evaluation and I recommend that you be seen in a face-to-face visit.   NOTE: There will be NO CHARGE for this E-Visit   If you are having a true medical emergency, please call 911.     For an urgent face to face visit, [place] has multiple urgent care centers for your convenience.  Click the link below for the full list of locations and hours, walk-in wait times, appointment scheduling options and driving directions:  Urgent Care - [place], [place], [place], [place], [place], [place]  [place]     Your MyChart E-visit questionnaire answers were reviewed by a board certified advanced clinical practitioner to complete your personal care plan based on your specific symptoms.    Thank you for using e-Visits.

## 2024-03-24 ENCOUNTER — Telehealth: Payer: Self-pay | Admitting: Family

## 2024-03-24 NOTE — Telephone Encounter (Signed)
 Unable to pend

## 2024-03-24 NOTE — Telephone Encounter (Signed)
 Copied from CRM [id number]. Topic: Clinical - Medication Refill >> Mar 24, 2024  9:28 AM Tanazia G wrote: Medication: denosumab  (PROLIA ) injection 60 mg  Has the patient contacted their pharmacy? Yes (Agent: If no, request that the patient contact the pharmacy for the refill. If patient does not wish to contact the pharmacy document the reason why and proceed with request.) (Agent: If yes, when and what did the pharmacy advise?)  This is the patient's preferred pharmacy:   [facility] [address] [place] [address] Phone: [phone] Fax: [phone]  Is this the correct pharmacy for this prescription? Yes If no, delete pharmacy and type the correct one.   Has the prescription been filled recently? Yes  Is the patient out of the medication? Yes  Has the patient been seen for an appointment in the last year OR does the patient have an upcoming appointment? Yes  Can we respond through MyChart? Yes  Agent: Please be advised that Rx refills may take up to 3 business days. We ask that you follow-up with your pharmacy.

## 2024-03-30 ENCOUNTER — Other Ambulatory Visit: Payer: Self-pay | Admitting: *Deleted

## 2024-03-30 ENCOUNTER — Other Ambulatory Visit: Payer: Self-pay

## 2024-03-30 ENCOUNTER — Encounter (HOSPITAL_COMMUNITY): Payer: Self-pay

## 2024-03-30 ENCOUNTER — Other Ambulatory Visit: Payer: Self-pay | Admitting: Pharmacy Technician

## 2024-03-30 DIAGNOSIS — M810 Age-related osteoporosis without current pathological fracture: Secondary | ICD-10-CM

## 2024-03-30 MED ORDER — DENOSUMAB 60 MG/ML ~~LOC~~ SOSY
60.0000 mg | PREFILLED_SYRINGE | Freq: Once | SUBCUTANEOUS | 0 refills | Status: DC
  Filled 2024-03-30: qty 1, 1d supply, fill #0

## 2024-03-30 NOTE — Telephone Encounter (Signed)
 Spoke with pt. She is needing the prescription sent in for prolia . Pt has an appt on 04/20/24. Rx has been sent to [facility] to have it delivered.

## 2024-03-31 ENCOUNTER — Other Ambulatory Visit: Payer: Self-pay

## 2024-03-31 NOTE — Progress Notes (Signed)
 Pharmacy Patient Advocate Encounter  Insurance verification completed.   The patient is insured through [employer]   Ran test claim for Prolia. Co-pay is $64.  This test claim was processed through [facility]- copay amounts may vary at other pharmacies due to pharmacy/plan contracts, or as the patient moves through the different stages of their insurance plan.

## 2024-03-31 NOTE — Addendum Note (Signed)
 Addended by: CORWIN ANTU on: 03/31/2024 09:37 AM   Modules accepted: Orders

## 2024-04-05 ENCOUNTER — Other Ambulatory Visit (HOSPITAL_COMMUNITY): Payer: Self-pay

## 2024-04-12 ENCOUNTER — Encounter: Payer: Self-pay | Admitting: Diagnostic Neuroimaging

## 2024-04-12 ENCOUNTER — Telehealth: Admitting: Diagnostic Neuroimaging

## 2024-04-12 DIAGNOSIS — G500 Trigeminal neuralgia: Secondary | ICD-10-CM

## 2024-04-12 NOTE — Progress Notes (Signed)
 [facility]  PATIENT: Katherine Brooks DOB: 1936/01/30  REFERRING CLINICIAN: Corwin Antu, FNP HISTORY FROM: patient REASON FOR VISIT: follow up   HISTORICAL  CHIEF COMPLAINT:  Chief Complaint  Patient presents with   left trigeminal neuralgia     HISTORY OF PRESENT ILLNESS:   UPDATE (04/12/24, VRP): Since last visit, doing well on CBZ 100mg  twice a day, but now off it x 3 weeks (patient tapered off on her own).   UPDATE (02/09/24, VRP): Since last visit, tried phenytoin , but cause lip swelling and facial rash, so stopped it. Also, had left facial and throat swelling, and CT scans showed left mandibular infx / abscess, saw dentist and had tooth extraction and antibiotics. Swelling is now better. Continues with left V1, V2, V3 pain, sharp and shooting and intermittent.   UPDATE (12/09/23, VRP): Since last visit, doing well after last flare up which got better by 2022. CBZ caused some rash initially, but later tolerated, then tried gabapentin  and did well. Then 1 month ago had flare up of left maxillary pain returned. Had some old gabapentin , but did not help. Now on pregabalin  25mg  twice a day and this is helping a little.  PRIOR HPI (07/10/20, VRP): 88 year old female here for evaluation of trigeminal neuralgia.  March 27, 2020 patient had onset of left maxillary sharp electrical pain sensations.  Symptoms aggravated by touching or brushing her teeth.  Pain was quite severe.  No other associated symptoms.  No triggering aggravating factors otherwise noted.  Patient was tried on gabapentin  which did not help.  She was changed to carbamazepine  which significantly improved symptoms.  Now patient on 200 mg twice a day which is have resolved symptoms.  However patient does have some fatigue and jittery sensations.  Patient has been on this dose for the past 11 days.    REVIEW OF SYSTEMS: Full 14 system review of systems performed and negative with exception of: As per  HPI.  ALLERGIES: Allergies  Allergen Reactions   Phenytoin  Rash   Lisinopril Other (See Comments)    passed out Other reaction(s): Other (See Comments), Other (See Comments) passed out I passed out   Carbamazepine  Rash    Had rash one time in 2021; later tolerated without rash in 2025    HOME MEDICATIONS: Outpatient Medications Prior to Visit  Medication Sig Dispense Refill   aspirin  EC 81 MG tablet Take 81 mg by mouth daily. Swallow whole.     atorvastatin  (LIPITOR ) 80 MG tablet TAKE 1 TABLET EVERY DAY 90 tablet 3   carbamazepine  (TEGRETOL ) 100 MG chewable tablet Chew 1 tablet (100 mg total) by mouth 2 (two) times daily. (Patient not taking: Reported on 04/12/2024) 180 tablet 1   carvedilol  (COREG ) 3.125 MG tablet Take 1 tablet (3.125 mg total) by mouth 2 (two) times daily. PATIENT MUST MAKE AN APPOINTMENT IN ORDER TO RECEIVE FUTURE REFILLS. FIRST ATTEMPT. 60 tablet 0   Cholecalciferol (VITAMIN D3) 50 MCG (2000 UT) TABS Take 1 each by mouth daily.     Cyanocobalamin  (B-12) 5000 MCG CAPS Take 1 Dose by mouth daily.     ezetimibe  (ZETIA ) 10 MG tablet Take 1 tablet (10 mg total) by mouth daily. PT. MUST MAKE AN APPOINTMENT IN ORDER TO RECEIVE FUTURE REFILLS. FIRST ATTEMPT. 30 tablet 0   ferrous sulfate 325 (65 FE) MG tablet Take 325 mg by mouth daily with breakfast.     furosemide  (LASIX ) 20 MG tablet TAKE 1 TABLET EVERY DAY AS NEEDED FOR  EDEMA 90 tablet 3   MAGNESIUM  PO Take 400 mg by mouth daily.     Multiple Vitamins-Minerals (WOMENS 50+ MULTI VITAMIN PO) Take by mouth daily.     Facility-Administered Medications Prior to Visit  Medication Dose Route Frequency Provider Last Rate Last Admin   [START ON 04/13/2024] denosumab  (PROLIA ) injection 60 mg  60 mg Subcutaneous Once Dugal, Tabitha, FNP        PAST MEDICAL HISTORY: Past Medical History:  Diagnosis Date   Allergy    CAD (coronary artery disease)    a. 12/2021 MV: mid-dist ant, antsept ischemia; b. 12/2021 Cath: LM 60ost,  90d, LAD 90ost, 95p, 18m, D2 30, LCX 90p, RCA 90p; c. 12/2021 CABG x 4: LIMA->LAD, VG->OM, VG->Diag, VG->RPDA.   Cancer (HCC)    skin   Chronic HFrEF (heart failure with reduced ejection fraction) (HCC)    CKD (chronic kidney disease), stage III (HCC)    COVID-19 virus infection 09/2021   Facial pain    Hyperlipidemia LDL goal <70    Hypertension    Ischemic cardiomyopathy    a. 11/2021 Echo: EF 30-35%, glob HK, GrI DD, nl RV fxn, mildly dil LA, triv MR.   LBBB (left bundle branch block)     PAST SURGICAL HISTORY: Past Surgical History:  Procedure Laterality Date   CATARACT EXTRACTION Bilateral 2025   CORONARY ARTERY BYPASS GRAFT N/A 01/07/2022   Procedure: CORONARY ARTERY BYPASS GRAFTING (CABG) X FOUR BYPASSES USING OPEN LEFT INTERNAL MAMMARY ARTERY AND  ENDOSCOPIC RIGHT AND LEFT GREATER SAPHENOUS VEIN HARVEST.;  Surgeon: Lucas Dorise POUR, MD;  Location: [facility] OR;  Service: Open Heart Surgery;  Laterality: N/A;   LEFT HEART CATH AND CORONARY ANGIOGRAPHY Left 01/01/2022   Procedure: LEFT HEART CATH AND CORONARY ANGIOGRAPHY;  Surgeon: Mady Bruckner, MD;  Location: [facility] INVASIVE CV LAB;  Service: Cardiovascular;  Laterality: Left;   salivary gland N/A 12/08/2012   TEE WITHOUT CARDIOVERSION N/A 01/07/2022   Procedure: TRANSESOPHAGEAL ECHOCARDIOGRAM (TEE);  Surgeon: Lucas Dorise POUR, MD;  Location: [facility] OR;  Service: Open Heart Surgery;  Laterality: N/A;    FAMILY HISTORY: Family History  Problem Relation Age of Onset   Heart disease Mother    Lung disease Father    Cancer Sister        unknown primary   Rheum arthritis Sister    Kidney Stones Sister    COPD Brother     SOCIAL HISTORY: Social History   Socioeconomic History   Marital status: Married    Spouse name: Helayne   Number of children: 3   Years of education: Not on file   Highest education level: Associate degree: occupational, Scientist, product/process development, or vocational program  Occupational History    Comment: Diplomatic Services operational officer, school system     Employer: retired  Tobacco Use   Smoking status: Never    Passive exposure: Never   Smokeless tobacco: Never  Vaping Use   Vaping status: Never Used  Substance and Sexual Activity   Alcohol  use: No   Drug use: No   Sexual activity: Not Currently  Other Topics Concern   Not on file  Social History Narrative   Lives with husband   Social Drivers of Health   Financial Resource Strain: Low Risk  (05/04/2023)   Overall Financial Resource Strain (CARDIA)    Difficulty of Paying Living Expenses: Not hard at all  Food Insecurity: No Food Insecurity (05/04/2023)   Hunger Vital Sign    Worried About Running Out of Food in the  Last Year: Never true    Ran Out of Food in the Last Year: Never true  Transportation Needs: No Transportation Needs (05/04/2023)   PRAPARE - Administrator, Civil Service (Medical): No    Lack of Transportation (Non-Medical): No  Physical Activity: Sufficiently Active (05/04/2023)   Exercise Vital Sign    Days of Exercise per Week: 5 days    Minutes of Exercise per Session: 90 min  Recent Concern: Physical Activity - Inactive (04/22/2023)   Exercise Vital Sign    Days of Exercise per Week: 0 days    Minutes of Exercise per Session: 0 min  Stress: No Stress Concern Present (05/04/2023)   [employer] of Occupational Health - Occupational Stress Questionnaire    Feeling of Stress : Not at all  Social Connections: Moderately Integrated (05/04/2023)   Social Connection and Isolation Panel    Frequency of Communication with Friends and Family: More than three times a week    Frequency of Social Gatherings with Friends and Family: Three times a week    Attends Religious Services: Never    Active Member of Clubs or Organizations: No    Attends Engineer, structural: More than 4 times per year    Marital Status: Married  Catering manager Violence: Not At Risk (04/23/2023)   Humiliation, Afraid, Rape, and Kick questionnaire    Fear of Current  or Ex-Partner: No    Emotionally Abused: No    Physically Abused: No    Sexually Abused: No     PHYSICAL EXAM  GENERAL EXAM/CONSTITUTIONAL: Vitals:  There were no vitals filed for this visit.  There is no height or weight on file to calculate BMI. Wt Readings from Last 3 Encounters:  03/08/24 164 lb (74.4 kg)  12/09/23 184 lb (83.5 kg)  12/04/23 184 lb 12.8 oz (83.8 kg)   Patient is in no distress; well developed, nourished and groomed; neck is supple  CARDIOVASCULAR: Examination of carotid arteries is normal; no carotid bruits Regular rate and rhythm, no murmurs Examination of peripheral vascular system by observation and palpation is normal  EYES: Ophthalmoscopic exam of optic discs and posterior segments is normal; no papilledema or hemorrhages No results found.  MUSCULOSKELETAL: Gait, strength, tone, movements noted in Neurologic exam below  NEUROLOGIC: MENTAL STATUS:     11/08/2020    1:13 PM 07/05/2019   11:21 AM  MMSE - Mini Mental State Exam  Not completed: Refused   Orientation to time  5  Orientation to Place  5  Registration  3  Attention/ Calculation  5  Recall  3  Language- repeat  1   awake, alert, oriented to person, place and time recent and remote memory intact normal attention and concentration language fluent, comprehension intact, naming intact fund of knowledge appropriate  CRANIAL NERVE:  2nd - no papilledema on fundoscopic exam 2nd, 3rd, 4th, 6th - pupils equal and reactive to light, visual fields full to confrontation, extraocular muscles intact, no nystagmus 5th - facial sensation symmetric 7th - facial strength symmetric 8th - hearing intact 9th - palate elevates symmetrically, uvula midline 11th - shoulder shrug symmetric 12th - tongue protrusion midline  MOTOR:  normal bulk and tone, full strength in the BUE, BLE  SENSORY:  normal and symmetric to light touch, temperature, vibration  COORDINATION:  finger-nose-finger,  fine finger movements normal  REFLEXES:  deep tendon reflexes TRACE and symmetric  GAIT/STATION:  narrow based gait     DIAGNOSTIC DATA (LABS,  IMAGING, TESTING) - I reviewed patient records, labs, notes, testing and imaging myself where available.  Lab Results  Component Value Date   WBC 7.2 08/06/2023   HGB 12.2 08/06/2023   HCT 38.7 08/06/2023   MCV 90 08/06/2023   PLT 252 08/06/2023      Component Value Date/Time   NA 137 10/09/2023 1131   NA 138 08/06/2023 1119   K 4.6 10/09/2023 1131   CL 105 10/09/2023 1131   CO2 26 10/09/2023 1131   GLUCOSE 92 10/09/2023 1131   BUN 22 10/09/2023 1131   BUN 27 08/06/2023 1119   CREATININE 1.50 (H) 01/05/2024 1729   CREATININE 1.29 (H) 03/01/2016 0924   CALCIUM  9.2 10/09/2023 1131   PROT 7.1 08/06/2023 1119   ALBUMIN  4.1 08/06/2023 1119   AST 21 08/06/2023 1119   ALT 12 08/06/2023 1119   ALKPHOS 120 08/06/2023 1119   BILITOT 0.3 08/06/2023 1119   GFRNONAA 27 (L) 03/26/2023 0956   GFRNONAA 40 (L) 03/01/2016 0924   GFRAA 42 (L) 03/17/2018 0416   GFRAA 46 (L) 03/01/2016 0924   Lab Results  Component Value Date   CHOL 123 03/27/2023   HDL 58 03/27/2023   LDLCALC 53 03/27/2023   TRIG 55 03/27/2023   CHOLHDL 2.4 08/30/2022   Lab Results  Component Value Date   HGBA1C 5.5 01/06/2022   Lab Results  Component Value Date   VITAMINB12 446 05/08/2023   Lab Results  Component Value Date   TSH 3.19 11/05/2021    03/30/20 MRI brain (with and without) [I reviewed images myself and agree with interpretation. -VRP]  - No mass or abnormal enhancement. The left superior cerebellar artery may have mass effect on the left trigeminal nerve root entry zone.   01/05/24 CT soft tissue 1. Dental disease and left lower facial soft tissue swelling/cellulitis as detailed on the contemporaneous maxillofacial CT. 2. No acute finding elsewhere in the neck. 3. Carotid atherosclerosis with potentially severe stenosis of the proximal left  ICA. Consider nonemergent carotid ultrasound for further evaluation.  01/05/24 CT maxillofacial 1. Periapical abscess surrounding the root of tooth # 20 with transcortical extension into the overlying buccal soft tissues. 2. There is a 3 x 7 mm loculated fluid collection abutting the mandibular body adjacent to the area of cortical erosion in the left mandibular body in keeping with a small subperiosteal abscess. 3. Extensive surrounding soft tissue swelling is seen involving the overlying soft tissues of the left buccal region extending inferiorly to the level of the hyoid.    ASSESSMENT AND PLAN  88 y.o. year old female here with:  Meds tried: CBZ (rash), gabapentin , pregabalin   Dx:  1. Left-sided trigeminal neuralgia      PLAN:  LEFT V1, V2, V3 trigeminal neuralgia (possible microvascular compression; also with new left mandibular abscess around #20 tooth, now s/p extraction and antibiotics, but pain continues) -If symptoms significant worsen or become intractable, then could consider gamma knife for microvascular decompression referral -if symptoms return then restart carbamazepine  100mg  twice a day (remote history of rash in 2021, but now tolerated it in 2025)  LEFT MANDIBULAR AND PERIAPICAL INFX / ABSCESS (#20 tooth) - per dentist consider oral surgeon follow up  LEFT ICA stenosis (found incidentally on CT scan) - follow up carotid u/s (already ordered; patient declined; may follow up with PCP)   Return for return to PCP.  Virtual Visit via Video Note  I connected with ERIN OBANDO on 04/12/24 at  1:45  PM EDT by a video enabled telemedicine application and verified that I am speaking with the correct person using two identifiers.   I discussed the limitations of evaluation and management by telemedicine and the availability of in person appointments. The patient expressed understanding and agreed to proceed.  Patient is at home and I am at the office.   I spent  15 minutes of face-to-face and non-face-to-face time with patient.  This included previsit chart review, lab review, study review, order entry, electronic health record documentation, patient education.      EDUARD FABIENE HANLON, MD 04/12/2024, 1:50 PM Certified in Neurology, Neurophysiology and Neuroimaging  [facility] [address], [address] [place], [place] [address] [phone]

## 2024-04-13 ENCOUNTER — Other Ambulatory Visit (INDEPENDENT_AMBULATORY_CARE_PROVIDER_SITE_OTHER)

## 2024-04-13 DIAGNOSIS — M810 Age-related osteoporosis without current pathological fracture: Secondary | ICD-10-CM

## 2024-04-13 LAB — BASIC METABOLIC PANEL WITH GFR
BUN: 20 mg/dL (ref 6–23)
CO2: 28 meq/L (ref 19–32)
Calcium: 9.3 mg/dL (ref 8.4–10.5)
Chloride: 105 meq/L (ref 96–112)
Creatinine, Ser: 1.61 mg/dL — ABNORMAL HIGH (ref 0.40–1.20)
GFR: 28.85 mL/min — ABNORMAL LOW (ref >60.00)
Glucose, Bld: 93 mg/dL (ref 70–99)
Potassium: 4.7 meq/L (ref 3.5–5.1)
Sodium: 139 meq/L (ref 135–145)

## 2024-04-14 ENCOUNTER — Other Ambulatory Visit: Payer: Self-pay

## 2024-04-14 ENCOUNTER — Other Ambulatory Visit: Payer: Self-pay | Admitting: Neurology

## 2024-04-14 ENCOUNTER — Telehealth: Payer: Self-pay

## 2024-04-14 DIAGNOSIS — E876 Hypokalemia: Secondary | ICD-10-CM

## 2024-04-14 DIAGNOSIS — N184 Chronic kidney disease, stage 4 (severe): Secondary | ICD-10-CM

## 2024-04-14 DIAGNOSIS — Z79899 Other long term (current) drug therapy: Secondary | ICD-10-CM

## 2024-04-14 NOTE — Progress Notes (Signed)
 Patient had labs done 8/5 and she is still waiting on office to let her know if they will move forward with Prolia .

## 2024-04-14 NOTE — Telephone Encounter (Signed)
 Pt has nurse visit appt on 04/20/24 for prolia  injection. The creatinine clearance calculator result was 29.46 ml/min.  The results for estimated creatinine clearance should be 30 and above. Is it OK for pt to get Prolia  injection or what to do? Sending to ONEIDA Patrick; FNP.

## 2024-04-16 ENCOUNTER — Ambulatory Visit: Payer: Self-pay | Admitting: Family

## 2024-04-16 NOTE — Telephone Encounter (Signed)
 Good morning Dr. Dennise,   Our mutual patient receives prolia  injections at our office for her osteoporosis and has been tolerating well however her most recent creatinine clearance estimates at 29.46. The cut off is 30 so it's very minimal difference, however I'm sure there is worry for severe hypocalcemia. Do you think prolia  could still be administered with close monitoring of calcium , phos, mag (and I will advise her to continue with daily calcium  vitamin D  supplementation as well) or do you think we should hold?   Thank for your time.    Regards,   Ginger Patrick FNP-C

## 2024-04-19 NOTE — Addendum Note (Signed)
 Addended by: CORWIN ANTU on: 04/19/2024 02:11 PM   Modules accepted: Orders

## 2024-04-19 NOTE — Telephone Encounter (Signed)
 Please see note from Dr. Dennise in regards to [facility] to proceed with prolia .  Please have pt schedule lab only follow up in two weeks after starting prolia  as we have to monitor her calcium  closely.

## 2024-04-19 NOTE — Telephone Encounter (Signed)
 Spoke with pt and pt notified as instructed and voiced understanding and pt will keep appt for NV on 04/20/24 at 10 am. Sending FYI to ONEIDA Patrick FNP.

## 2024-04-20 ENCOUNTER — Ambulatory Visit

## 2024-04-20 ENCOUNTER — Other Ambulatory Visit: Payer: Self-pay

## 2024-04-20 DIAGNOSIS — M810 Age-related osteoporosis without current pathological fracture: Secondary | ICD-10-CM

## 2024-04-20 MED ORDER — DENOSUMAB 60 MG/ML ~~LOC~~ SOSY: 60.0000 mg | PREFILLED_SYRINGE | SUBCUTANEOUS | Status: AC

## 2024-04-20 NOTE — Progress Notes (Signed)
 Patient received Prolia  injection today at office. Adjusting initial assessment date.

## 2024-04-20 NOTE — Progress Notes (Signed)
 Per orders of Ginger Patrick, NP, injection of Prolia   given by Bobbette Sprague in left deltoid. Patient tolerated injection well.

## 2024-04-28 ENCOUNTER — Ambulatory Visit (INDEPENDENT_AMBULATORY_CARE_PROVIDER_SITE_OTHER): Payer: Medicare PPO

## 2024-04-28 VITALS — Ht 60.0 in | Wt 164.0 lb

## 2024-04-28 DIAGNOSIS — Z0000 Encounter for general adult medical examination without abnormal findings: Secondary | ICD-10-CM | POA: Diagnosis not present

## 2024-04-28 NOTE — Progress Notes (Signed)
 Please attest and cosign this visit due to patients primary care provider not being in the office at the time the visit was completed.    Subjective:   Katherine Brooks is a 88 y.o. who presents for a Medicare Wellness preventive visit.  As a reminder, Annual Wellness Visits don't include a physical exam, and some assessments may be limited, especially if this visit is performed virtually. We may recommend an in-person follow-up visit with your provider if needed.  Visit Complete: Virtual I connected with  Katherine Brooks on 04/28/24 by a audio enabled telemedicine application and verified that I am speaking with the correct person using two identifiers.  Patient Location: Home  Provider Location: Office/Clinic  I discussed the limitations of evaluation and management by telemedicine. The patient expressed understanding and agreed to proceed.  Vital Signs: Because this visit was a virtual/telehealth visit, some criteria may be missing or patient reported. Any vitals not documented were not able to be obtained and vitals that have been documented are patient reported.  VideoDeclined- This patient declined Librarian, academic. Therefore the visit was completed with audio only.  Persons Participating in Visit: Patient.  AWV Questionnaire: Yes: Patient Medicare AWV questionnaire was completed by the patient on 04/24/24; I have confirmed that all information answered by patient is correct and no changes since this date.  Cardiac Risk Factors include: advanced age (>16men, >15 women);dyslipidemia;obesity (BMI >30kg/m2)     Objective:    Today's Vitals   04/28/24 1304  Weight: 164 lb (74.4 kg)  Height: 5' (1.524 m)   Body mass index is 32.03 kg/m.     04/28/2024    1:09 PM 04/23/2023   10:29 AM 03/26/2023    9:59 AM 02/15/2022    9:29 AM 01/01/2022    6:00 PM 01/01/2022    7:41 AM 12/31/2021    9:09 AM  Advanced Directives  Does Patient Have a Medical Advance  Directive? Yes Yes No Yes Yes Yes Yes  Type of Estate agent of [place];Living will Healthcare Power of [place];Living will  Healthcare Power of [place];Living will Healthcare Power of [employer] Power of [employer] Power of Attorney  Does patient want to make changes to medical advance directive?     No - Patient declined    Copy of Healthcare Power of Attorney in Chart? No - copy requested No - copy requested  No - copy requested     Would patient like information on creating a medical advance directive?   No - Patient declined        Current Medications (verified) Outpatient Encounter Medications as of 04/28/2024  Medication Sig   aspirin  EC 81 MG tablet Take 81 mg by mouth daily. Swallow whole.   atorvastatin  (LIPITOR ) 80 MG tablet TAKE 1 TABLET EVERY DAY   carbamazepine  (TEGRETOL ) 100 MG chewable tablet Chew 1 tablet (100 mg total) by mouth 2 (two) times daily.   carvedilol  (COREG ) 3.125 MG tablet Take 1 tablet (3.125 mg total) by mouth 2 (two) times daily. PATIENT MUST MAKE AN APPOINTMENT IN ORDER TO RECEIVE FUTURE REFILLS. FIRST ATTEMPT.   Cholecalciferol (VITAMIN D3) 50 MCG (2000 UT) TABS Take 1 each by mouth daily.   ezetimibe  (ZETIA ) 10 MG tablet Take 1 tablet (10 mg total) by mouth daily. PT. MUST MAKE AN APPOINTMENT IN ORDER TO RECEIVE FUTURE REFILLS. FIRST ATTEMPT.   ferrous sulfate 325 (65 FE) MG tablet Take 325 mg by mouth daily with breakfast.  furosemide  (LASIX ) 20 MG tablet TAKE 1 TABLET EVERY DAY AS NEEDED FOR EDEMA   MAGNESIUM  PO Take 400 mg by mouth daily.   Multiple Vitamins-Minerals (WOMENS 50+ MULTI VITAMIN PO) Take by mouth daily.   Cyanocobalamin  (B-12) 5000 MCG CAPS Take 1 Dose by mouth daily.   Facility-Administered Encounter Medications as of 04/28/2024  Medication   denosumab  (PROLIA ) injection 60 mg    Allergies (verified) Phenytoin , Lisinopril, and Carbamazepine    History: Past Medical History:  Diagnosis Date    Allergy    Arthritis ?   Alendronate  Tab 70 mg. 1 x week NO LONGER TAKE   CAD (coronary artery disease)    a. 12/2021 MV: mid-dist ant, antsept ischemia; b. 12/2021 Cath: LM 60ost, 90d, LAD 90ost, 95p, 68m, D2 30, LCX 90p, RCA 90p; c. 12/2021 CABG x 4: LIMA->LAD, VG->OM, VG->Diag, VG->RPDA.   Cancer (HCC)    skin   Chronic HFrEF (heart failure with reduced ejection fraction) (HCC)    CKD (chronic kidney disease), stage III (HCC)    COVID-19 virus infection 09/2021   Facial pain    Hyperlipidemia LDL goal <70    Hypertension    Ischemic cardiomyopathy    a. 11/2021 Echo: EF 30-35%, glob HK, GrI DD, nl RV fxn, mildly dil LA, triv MR.   LBBB (left bundle branch block)    Skin cancer ?   Removed by Dr.Goodrich MOHS   Past Surgical History:  Procedure Laterality Date   CATARACT EXTRACTION Bilateral 2025   CORONARY ARTERY BYPASS GRAFT N/A 01/07/2022   Procedure: CORONARY ARTERY BYPASS GRAFTING (CABG) X FOUR BYPASSES USING OPEN LEFT INTERNAL MAMMARY ARTERY AND  ENDOSCOPIC RIGHT AND LEFT GREATER SAPHENOUS VEIN HARVEST.;  Surgeon: Lucas Dorise POUR, MD;  Location: [facility] OR;  Service: Open Heart Surgery;  Laterality: N/A;   LEFT HEART CATH AND CORONARY ANGIOGRAPHY Left 01/01/2022   Procedure: LEFT HEART CATH AND CORONARY ANGIOGRAPHY;  Surgeon: Mady Bruckner, MD;  Location: [facility] INVASIVE CV LAB;  Service: Cardiovascular;  Laterality: Left;   salivary gland N/A 12/08/2012   TEE WITHOUT CARDIOVERSION N/A 01/07/2022   Procedure: TRANSESOPHAGEAL ECHOCARDIOGRAM (TEE);  Surgeon: Lucas Dorise POUR, MD;  Location: [facility] OR;  Service: Open Heart Surgery;  Laterality: N/A;   Family History  Problem Relation Age of Onset   Heart disease Mother    Lung disease Father    COPD Father    Cancer Sister        unknown primary   Rheum arthritis Sister    Kidney Stones Sister    COPD Brother    Arthritis Sister    Social History   Socioeconomic History   Marital status: Married    Spouse name: Katherine Brooks   Number  of children: 3   Years of education: Not on file   Highest education level: Associate degree: occupational, Scientist, product/process development, or vocational program  Occupational History    Comment: Diplomatic Services operational officer, school system    Employer: retired  Tobacco Use   Smoking status: Never    Passive exposure: Never   Smokeless tobacco: Never  Vaping Use   Vaping status: Never Used  Substance and Sexual Activity   Alcohol  use: No   Drug use: No   Sexual activity: Not Currently  Other Topics Concern   Not on file  Social History Narrative   Lives with husband   Social Drivers of Health   Financial Resource Strain: Low Risk  (04/24/2024)   Overall Financial Resource Strain (CARDIA)    Difficulty of  Paying Living Expenses: Not hard at all  Food Insecurity: No Food Insecurity (04/24/2024)   Hunger Vital Sign    Worried About Running Out of Food in the Last Year: Never true    Ran Out of Food in the Last Year: Never true  Transportation Needs: No Transportation Needs (04/24/2024)   PRAPARE - Administrator, Civil Service (Medical): No    Lack of Transportation (Non-Medical): No  Physical Activity: Sufficiently Active (04/24/2024)   Exercise Vital Sign    Days of Exercise per Week: 5 days    Minutes of Exercise per Session: 60 min  Stress: No Stress Concern Present (04/24/2024)   [employer] of Occupational Health - Occupational Stress Questionnaire    Feeling of Stress: Not at all  Social Connections: Moderately Isolated (04/24/2024)   Social Connection and Isolation Panel    Frequency of Communication with Friends and Family: More than three times a week    Frequency of Social Gatherings with Friends and Family: Twice a week    Attends Religious Services: Never    Database administrator or Organizations: No    Attends Engineer, structural: Not on file    Marital Status: Married    Tobacco Counseling Counseling given: Not Answered    Clinical Intake:  Pre-visit preparation  completed: Yes  Pain : No/denies pain     BMI - recorded: 32.03 Nutritional Status: BMI > 30  Obese Nutritional Risks: None Diabetes: No  Lab Results  Component Value Date   HGBA1C 5.5 01/06/2022   HGBA1C 5.6 06/24/2018   HGBA1C 5.4 10/01/2012     How often do you need to have someone help you when you read instructions, pamphlets, or other written materials from your doctor or pharmacy?: 1 - Never  Interpreter Needed?: No  Comments: lives with husband Information entered by :: B.Bekah Igoe,LPN   Activities of Daily Living     04/24/2024    8:58 AM  In your present state of health, do you have any difficulty performing the following activities:  Hearing? 0  Vision? 0  Difficulty concentrating or making decisions? 0  Walking or climbing stairs? 0  Dressing or bathing? 0  Doing errands, shopping? 0  Preparing Food and eating ? N  Using the Toilet? N  In the past six months, have you accidently leaked urine? N  Do you have problems with loss of bowel control? N  Managing your Medications? N  Managing your Finances? N  Housekeeping or managing your Housekeeping? N    Patient Care Team: Corwin Antu, FNP as PCP - General (Family Medicine) Perla Evalene PARAS, MD as PCP - Cardiology (Cardiology) Robinson Mayo, OD as Referring Physician (Optometry)  I have updated your Care Teams any recent Medical Services you may have received from other providers in the past year.     Assessment:   This is a routine wellness examination for Arelene.  Hearing/Vision screen Hearing Screening - Comments:: Pt says her hearing is good Vision Screening - Comments:: Pt says her vision is good   Goals Addressed             This Visit's Progress    COMPLETED: Patient Stated       07/05/2019, I will try lose some weight in the future.      Patient Stated   On track    04/28/24- I will maintain and continue medications as prescribed.     COMPLETED: Patient Stated  02/15/2022, no  goals     Patient Stated   On track    04/28/24-Lose weight       Depression Screen     04/28/2024    1:07 PM 05/08/2023   10:51 AM 04/23/2023   10:22 AM 02/15/2022    9:30 AM 11/08/2020    1:12 PM 07/12/2020    3:55 PM 07/07/2019   10:31 AM  PHQ 2/9 Scores  PHQ - 2 Score 0 0 0 0 0 0 0  PHQ- 9 Score  0   0 3 0    Fall Risk     04/24/2024    8:58 AM 05/08/2023   10:50 AM 04/22/2023   12:51 AM 02/15/2022    9:30 AM 02/12/2022    4:00 PM  Fall Risk   Falls in the past year? 0 1 0 0 0  Number falls in past yr: 0 1 0 0   Injury with Fall? 0 1 0 0 0  Risk for fall due to : No Fall Risks No Fall Risks No Fall Risks Medication side effect   Follow up Education provided;Falls prevention discussed Falls evaluation completed Falls prevention discussed;Falls evaluation completed Falls evaluation completed;Education provided;Falls prevention discussed       Data saved with a previous flowsheet row definition    MEDICARE RISK AT HOME:  Medicare Risk at Home Any stairs in or around the home?: (Patient-Rptd) Yes If so, are there any without handrails?: (Patient-Rptd) No Home free of loose throw rugs in walkways, pet beds, electrical cords, etc?: (Patient-Rptd) Yes Adequate lighting in your home to reduce risk of falls?: (Patient-Rptd) Yes Life alert?: (Patient-Rptd) No Use of a cane, walker or w/c?: (Patient-Rptd) No Grab bars in the bathroom?: (Patient-Rptd) Yes Shower chair or bench in shower?: (Patient-Rptd) Yes Elevated toilet seat or a handicapped toilet?: (Patient-Rptd) Yes  TIMED UP AND GO:  Was the test performed?  No  Cognitive Function: 6CIT completed    11/08/2020    1:13 PM 07/05/2019   11:21 AM  MMSE - Mini Mental State Exam  Not completed: Refused   Orientation to time  5  Orientation to Place  5  Registration  3  Attention/ Calculation  5  Recall  3  Language- repeat  1        04/28/2024    1:11 PM 04/23/2023   10:31 AM 02/15/2022    9:31 AM  6CIT Screen  What  Year? 0 points 0 points 0 points  What month? 0 points 0 points 0 points  What time? 0 points 0 points 0 points  Count back from 20 0 points 0 points 0 points  Months in reverse 0 points 0 points 0 points  Repeat phrase 0 points 0 points 0 points  Total Score 0 points 0 points 0 points    Immunizations Immunization History  Administered Date(s) Administered   Fluad  Quad(high Dose 65+) 07/12/2020   Fluad  Trivalent(High Dose 65+) 07/30/2023   Influenza Split 06/25/2017   Influenza, High Dose Seasonal PF 06/13/2016, 05/09/2018   Influenza-Unspecified 07/10/2013, 06/05/2015, 06/13/2016, 07/20/2021   PFIZER(Purple Top)SARS-COV-2 Vaccination 09/21/2019, 10/11/2019   Pneumococcal Conjugate-13 03/01/2016   Pneumococcal Polysaccharide-23 03/20/2018   Respiratory Syncytial Virus Vaccine ,Recomb Aduvanted(Arexvy ) 08/04/2023   Td 01/14/2002   Tdap 06/12/2021   Zoster Recombinant(Shingrix) 05/09/2018, 07/15/2018    Screening Tests Health Maintenance  Topic Date Due   COVID-19 Vaccine (3 - Pfizer risk series) 11/08/2019   INFLUENZA VACCINE  04/09/2024   Medicare Annual  Wellness (AWV)  04/28/2025   DTaP/Tdap/Td (3 - Td or Tdap) 06/13/2031   Pneumococcal Vaccine: 50+ Years  Completed   DEXA SCAN  Completed   Zoster Vaccines- Shingrix  Completed   HPV VACCINES  Aged Out   Meningococcal B Vaccine  Aged Out    Health Maintenance  Health Maintenance Due  Topic Date Due   COVID-19 Vaccine (3 - Pfizer risk series) 11/08/2019   INFLUENZA VACCINE  04/09/2024   Health Maintenance Items Addressed: None needed at this time  Additional Screening:  Vision Screening: Recommended annual ophthalmology exams for early detection of glaucoma and other disorders of the eye. Would you like a referral to an eye doctor? No    Dental Screening: Recommended annual dental exams for proper oral hygiene  Community Resource Referral / Chronic Care Management: CRR required this visit?  No   CCM required  this visit?  Appt scheduled with PCP for JUMP LIST   Plan:    I have personally reviewed and noted the following in the patient's chart:   Medical and social history Use of alcohol , tobacco or illicit drugs  Current medications and supplements including opioid prescriptions. Patient is not currently taking opioid prescriptions. Functional ability and status Nutritional status Physical activity Advanced directives List of other physicians Hospitalizations, surgeries, and ER visits in previous 12 months Vitals Screenings to include cognitive, depression, and falls Referrals and appointments  In addition, I have reviewed and discussed with patient certain preventive protocols, quality metrics, and best practice recommendations. A written personalized care plan for preventive services as well as general preventive health recommendations were provided to patient.   Erminio LITTIE Saris, LPN   1/79/7974   After Visit Summary: (MyChart) Due to this being a telephonic visit, the after visit summary with patients personalized plan was offered to patient via MyChart   Notes: Nothing significant to report at this time.

## 2024-04-28 NOTE — Patient Instructions (Signed)
 Ms. Johnsen , Thank you for taking time out of your busy schedule to complete your Annual Wellness Visit with me. I enjoyed our conversation and look forward to speaking with you again next year. I, as well as your care team,  appreciate your ongoing commitment to your health goals. Please review the following plan we discussed and let me know if I can assist you in the future. Your Game plan/ To Do List    Referrals: If you haven't heard from the office you've been referred to, please reach out to them at the phone provided.   Follow up Visits: We will see or speak with you next year for your Next Medicare AWV with our clinical staff-04/29/25 @ 1pm televisit Have you seen your provider in the last 6 months (3 months if uncontrolled diabetes)? No  Clinician Recommendations:  Aim for 30 minutes of exercise or brisk walking, 6-8 glasses of water, and 5 servings of fruits and vegetables each day.       This is a list of the screenings recommended for you:  Health Maintenance  Topic Date Due   COVID-19 Vaccine (3 - Pfizer risk series) 11/08/2019   Flu Shot  04/09/2024   Medicare Annual Wellness Visit  04/28/2025   DTaP/Tdap/Td vaccine (3 - Td or Tdap) 06/13/2031   Pneumococcal Vaccine for age over 40  Completed   DEXA scan (bone density measurement)  Completed   Zoster (Shingles) Vaccine  Completed   HPV Vaccine  Aged Out   Meningitis B Vaccine  Aged Out    Advanced directives: (Copy Requested) Please bring a copy of your health care power of attorney and living will to the office to be added to your chart at your convenience. You can mail to [facility] [address] [place], [place] [address] or email to [email or web address] Advance Care Planning is important because it:  [x]  Makes sure you receive the medical care that is consistent with your values, goals, and preferences  [x]  It provides guidance to your family and loved ones and reduces their decisional burden  about whether or not they are making the right decisions based on your wishes.  Follow the link provided in your after visit summary or read over the paperwork we have mailed to you to help you started getting your Advance Directives in place. If you need assistance in completing these, please reach out to us  so that we can help you!

## 2024-05-04 ENCOUNTER — Other Ambulatory Visit (INDEPENDENT_AMBULATORY_CARE_PROVIDER_SITE_OTHER)

## 2024-05-04 DIAGNOSIS — N184 Chronic kidney disease, stage 4 (severe): Secondary | ICD-10-CM

## 2024-05-04 DIAGNOSIS — E876 Hypokalemia: Secondary | ICD-10-CM

## 2024-05-04 DIAGNOSIS — Z79899 Other long term (current) drug therapy: Secondary | ICD-10-CM

## 2024-05-04 LAB — MAGNESIUM: Magnesium: 2.7 mg/dL — ABNORMAL HIGH (ref 1.5–2.5)

## 2024-05-04 LAB — PHOSPHORUS: Phosphorus: 3.7 mg/dL (ref 2.3–4.6)

## 2024-05-04 LAB — BASIC METABOLIC PANEL WITH GFR
BUN: 19 mg/dL (ref 6–23)
CO2: 25 meq/L (ref 19–32)
Calcium: 8.8 mg/dL (ref 8.4–10.5)
Chloride: 105 meq/L (ref 96–112)
Creatinine, Ser: 1.65 mg/dL — ABNORMAL HIGH (ref 0.40–1.20)
GFR: 28 mL/min — ABNORMAL LOW (ref >60.00)
Glucose, Bld: 89 mg/dL (ref 70–99)
Potassium: 4.6 meq/L (ref 3.5–5.1)
Sodium: 139 meq/L (ref 135–145)

## 2024-05-04 LAB — VITAMIN D 25 HYDROXY (VIT D DEFICIENCY, FRACTURES): VITD: 50.53 ng/mL (ref 30.00–100.00)

## 2024-05-05 ENCOUNTER — Ambulatory Visit: Payer: Self-pay | Admitting: Family

## 2024-05-06 ENCOUNTER — Other Ambulatory Visit: Payer: Self-pay | Admitting: Cardiovascular Disease

## 2024-05-06 DIAGNOSIS — I5020 Unspecified systolic (congestive) heart failure: Secondary | ICD-10-CM

## 2024-05-06 DIAGNOSIS — Z8679 Personal history of other diseases of the circulatory system: Secondary | ICD-10-CM

## 2024-05-28 ENCOUNTER — Other Ambulatory Visit: Payer: Self-pay

## 2024-07-14 ENCOUNTER — Other Ambulatory Visit: Payer: Self-pay | Admitting: Diagnostic Neuroimaging

## 2024-07-14 ENCOUNTER — Other Ambulatory Visit: Payer: Self-pay

## 2024-07-14 MED ORDER — FLUZONE HIGH-DOSE 0.5 ML IM SUSY
0.5000 mL | PREFILLED_SYRINGE | Freq: Once | INTRAMUSCULAR | 0 refills | Status: AC
  Filled 2024-07-14: qty 0.5, 1d supply, fill #0

## 2024-07-28 ENCOUNTER — Other Ambulatory Visit: Payer: Self-pay

## 2024-08-27 ENCOUNTER — Other Ambulatory Visit: Payer: Self-pay

## 2024-09-21 ENCOUNTER — Other Ambulatory Visit (HOSPITAL_COMMUNITY): Payer: Self-pay

## 2024-09-21 ENCOUNTER — Telehealth: Payer: Self-pay

## 2024-09-21 NOTE — Telephone Encounter (Signed)
 Prolia  VOB initiated via [email or web address]  Next Prolia  inj DUE: 10/21/24

## 2024-09-24 ENCOUNTER — Other Ambulatory Visit (HOSPITAL_COMMUNITY): Payer: Self-pay

## 2024-09-24 ENCOUNTER — Other Ambulatory Visit: Payer: Self-pay | Admitting: *Deleted

## 2024-09-24 DIAGNOSIS — M810 Age-related osteoporosis without current pathological fracture: Secondary | ICD-10-CM

## 2024-09-24 MED ORDER — DENOSUMAB 60 MG/ML ~~LOC~~ SOSY: 60.0000 mg | PREFILLED_SYRINGE | Freq: Once | SUBCUTANEOUS | 0 refills | Status: AC

## 2024-09-24 NOTE — Telephone Encounter (Signed)
 Pt ready for scheduling for PROLIA  on or after : 10/21/24  Option# 1: Buy/Bill (Office supplied medication)  Out-of-pocket cost due at time of clinic visit: $392  Number of injection/visits approved: 2  Primary: [employer] Prolia  co-insurance: 20% Admin fee co-insurance: $40  Secondary: --- Prolia  co-insurance:  Admin fee co-insurance:   Medical Benefit Details: Date Benefits were checked: 09/22/24 Deductible: NO/ Coinsurance: 20%/ Admin Fee: $40  Prior Auth: APPROVED PA# [id number] Expiration Date: 09/09/24-09/08/25   # of doses approved: 2 ----------------------------------------------------------------------- Option# 2- Med Obtained from pharmacy:  Pharmacy benefit: Copay $64 (Paid to pharmacy) Admin Fee: $40 (Pay at clinic)  Prior Auth: N/A PA# Expiration Date:   # of doses approved:   If patient wants fill through the pharmacy benefit please send prescription to: [facility], and include estimated need by date in rx notes. Pharmacy will ship medication directly to the office. ----------------------------------------------------------------------- Option# 3- Med Obtained from pharmacy- MEDICAL BENEFIT VIA SPECIALTY PHARMACY ([facility])  Pharmacy benefit: Copay $0 (Paid to pharmacy) Admin Fee: $40 (Pay at clinic)  Prior Auth: APPROVED PA# [id number] Expiration Date: 09/09/24-09/08/25   # of doses approved: 2  Patient NOT eligible for Prolia  Copay Card. Copay Card can make patient's cost as little as $25. Link to apply: [email or web address]://www.amgensupportplus.com/copay  ** This summary of benefits is an estimation of the patient's out-of-pocket cost. Exact cost may very based on individual plan coverage.

## 2024-09-24 NOTE — Telephone Encounter (Signed)
 SABRA

## 2024-09-27 ENCOUNTER — Other Ambulatory Visit (HOSPITAL_COMMUNITY): Payer: Self-pay

## 2024-09-27 ENCOUNTER — Other Ambulatory Visit: Payer: Self-pay

## 2024-09-27 NOTE — Progress Notes (Signed)
 Benefits investigation complete. See encounter from 09/21/24. Patient's copay is $0 if they fill through [facility] pharmacy. Prescription routed to [facility] in [place]. Dis-enrolling.

## 2024-09-28 ENCOUNTER — Other Ambulatory Visit

## 2024-09-28 DIAGNOSIS — M810 Age-related osteoporosis without current pathological fracture: Secondary | ICD-10-CM

## 2024-09-28 LAB — BASIC METABOLIC PANEL WITH GFR
BUN: 25 mg/dL — ABNORMAL HIGH (ref 6–23)
CO2: 26 meq/L (ref 19–32)
Calcium: 9.4 mg/dL (ref 8.4–10.5)
Chloride: 106 meq/L (ref 96–112)
Creatinine, Ser: 1.6 mg/dL — ABNORMAL HIGH (ref 0.40–1.20)
GFR: 28.98 mL/min — ABNORMAL LOW
Glucose, Bld: 91 mg/dL (ref 70–99)
Potassium: 5.3 meq/L — ABNORMAL HIGH (ref 3.5–5.1)
Sodium: 136 meq/L (ref 135–145)

## 2024-09-29 NOTE — Telephone Encounter (Signed)
 [employer] called and said they are located in [place] and must have received this by mistake because they are too far from [place] for someone to pick up. Please verify pharmacy and resend

## 2024-09-30 ENCOUNTER — Other Ambulatory Visit: Payer: Self-pay | Admitting: Cardiovascular Disease

## 2024-09-30 ENCOUNTER — Ambulatory Visit: Payer: Self-pay | Admitting: Family

## 2024-09-30 DIAGNOSIS — E875 Hyperkalemia: Secondary | ICD-10-CM

## 2024-10-06 ENCOUNTER — Ambulatory Visit

## 2024-10-26 ENCOUNTER — Ambulatory Visit

## 2025-04-29 ENCOUNTER — Ambulatory Visit
